# Patient Record
Sex: Male | Born: 1950 | Race: White | Hispanic: No | Marital: Married | Smoking: Former smoker
Health system: Southern US, Community
[De-identification: ages and names within clinical notes are randomized; demographics above are authoritative.]

## PROBLEM LIST (undated history)

## (undated) DIAGNOSIS — R0600 Dyspnea, unspecified: Secondary | ICD-10-CM

## (undated) DIAGNOSIS — T8859XA Other complications of anesthesia, initial encounter: Secondary | ICD-10-CM

## (undated) DIAGNOSIS — T4145XA Adverse effect of unspecified anesthetic, initial encounter: Secondary | ICD-10-CM

## (undated) DIAGNOSIS — R51 Headache: Secondary | ICD-10-CM

## (undated) DIAGNOSIS — C801 Malignant (primary) neoplasm, unspecified: Secondary | ICD-10-CM

## (undated) DIAGNOSIS — F419 Anxiety disorder, unspecified: Secondary | ICD-10-CM

## (undated) DIAGNOSIS — I5189 Other ill-defined heart diseases: Secondary | ICD-10-CM

## (undated) DIAGNOSIS — I2699 Other pulmonary embolism without acute cor pulmonale: Secondary | ICD-10-CM

## (undated) DIAGNOSIS — C61 Malignant neoplasm of prostate: Secondary | ICD-10-CM

## (undated) DIAGNOSIS — K219 Gastro-esophageal reflux disease without esophagitis: Secondary | ICD-10-CM

## (undated) DIAGNOSIS — I10 Essential (primary) hypertension: Secondary | ICD-10-CM

## (undated) DIAGNOSIS — Z9889 Other specified postprocedural states: Secondary | ICD-10-CM

## (undated) DIAGNOSIS — M1990 Unspecified osteoarthritis, unspecified site: Secondary | ICD-10-CM

## (undated) DIAGNOSIS — R112 Nausea with vomiting, unspecified: Secondary | ICD-10-CM

## (undated) DIAGNOSIS — R519 Headache, unspecified: Secondary | ICD-10-CM

## (undated) HISTORY — PX: PROSTATE SURGERY: SHX751

---
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
## 2001-09-30 HISTORY — PX: ROTATOR CUFF REPAIR: SHX139

## 2003-08-22 ENCOUNTER — Other Ambulatory Visit: Payer: Self-pay

## 2012-10-09 ENCOUNTER — Ambulatory Visit: Payer: Self-pay | Admitting: Unknown Physician Specialty

## 2012-10-12 LAB — PATHOLOGY REPORT

## 2013-08-31 ENCOUNTER — Ambulatory Visit: Payer: Self-pay | Admitting: Physician Assistant

## 2013-10-05 ENCOUNTER — Ambulatory Visit: Payer: Self-pay | Admitting: General Practice

## 2014-12-26 DIAGNOSIS — I2510 Atherosclerotic heart disease of native coronary artery without angina pectoris: Secondary | ICD-10-CM | POA: Insufficient documentation

## 2014-12-26 DIAGNOSIS — I10 Essential (primary) hypertension: Secondary | ICD-10-CM | POA: Insufficient documentation

## 2014-12-26 DIAGNOSIS — E785 Hyperlipidemia, unspecified: Secondary | ICD-10-CM | POA: Insufficient documentation

## 2015-10-04 ENCOUNTER — Ambulatory Visit: Payer: Self-pay | Admitting: Physician Assistant

## 2015-10-04 ENCOUNTER — Encounter: Payer: Self-pay | Admitting: Physician Assistant

## 2015-10-04 VITALS — BP 110/60 | HR 93 | Temp 98.4°F

## 2015-10-04 DIAGNOSIS — J00 Acute nasopharyngitis [common cold]: Secondary | ICD-10-CM

## 2015-10-04 NOTE — Progress Notes (Signed)
S: C/o runny nose and congestion for 3 days, no fever, chills, cp/sob, v/d; mucus was  clear throughout the day, cough is sporadic,   Using otc meds: tussin dm  O: PE: vitals wnl nad,  perrl eomi, normocephalic, tms dull, nasal mucosa red and swollen, throat injected, neck supple no lymph, lungs c t a, cv rrr, neuro intact  A:  Acute viral uri   P: reassurance, drink fluids, continue regular meds , use otc meds of choice, return if not improving in 5 days, return earlier if worsening

## 2015-10-19 ENCOUNTER — Telehealth: Payer: Self-pay | Admitting: Emergency Medicine

## 2015-10-19 NOTE — Telephone Encounter (Signed)
Patient called in and said that he is experiencing a lot of cough and congestion and wanted to know if you can call something in to [facility] on [address].

## 2015-12-22 ENCOUNTER — Other Ambulatory Visit: Payer: Self-pay

## 2015-12-22 DIAGNOSIS — Z299 Encounter for prophylactic measures, unspecified: Secondary | ICD-10-CM

## 2015-12-22 NOTE — Progress Notes (Signed)
Patient came in to have blood drawn per his physician, Dr. Vonzella Nipple from [facility].  Blood was drawn from the right arm without any incident. Patient wants results faxed to Dr. Gilford Rile when they are finalized.

## 2015-12-23 LAB — URINALYSIS, COMPLETE
Bilirubin, UA: NEGATIVE
Glucose, UA: NEGATIVE
Ketones, UA: NEGATIVE
Leukocytes, UA: NEGATIVE
Nitrite, UA: NEGATIVE
PH UA: 7 (ref 5.0–7.5)
PROTEIN UA: NEGATIVE
RBC UA: NEGATIVE
Specific Gravity, UA: 1.017 (ref 1.005–1.030)
Urobilinogen, Ur: 1 mg/dL (ref 0.2–1.0)

## 2015-12-23 LAB — CMP12+LP+TP+TSH+6AC+CBC/D/PLT
ALK PHOS: 59 IU/L (ref 39–117)
ALT: 19 IU/L (ref 0–44)
AST: 22 IU/L (ref 0–40)
Albumin/Globulin Ratio: 2 (ref 1.2–2.2)
Albumin: 4.5 g/dL (ref 3.6–4.8)
BASOS: 1 %
BILIRUBIN TOTAL: 0.5 mg/dL (ref 0.0–1.2)
BUN / CREAT RATIO: 9 — AB (ref 10–22)
BUN: 9 mg/dL (ref 8–27)
Basophils Absolute: 0.1 10*3/uL (ref 0.0–0.2)
CHOL/HDL RATIO: 4.7 ratio (ref 0.0–5.0)
Calcium: 9.2 mg/dL (ref 8.6–10.2)
Chloride: 99 mmol/L (ref 96–106)
Cholesterol, Total: 173 mg/dL (ref 100–199)
Creatinine, Ser: 0.98 mg/dL (ref 0.76–1.27)
EOS (ABSOLUTE): 0.1 10*3/uL (ref 0.0–0.4)
EOS: 2 %
ESTIMATED CHD RISK: 0.9 times avg. (ref 0.0–1.0)
Free Thyroxine Index: 1.9 (ref 1.2–4.9)
GFR calc non Af Amer: 80 mL/min/{1.73_m2} (ref >59)
GFR, EST AFRICAN AMERICAN: 92 mL/min/{1.73_m2} (ref >59)
GGT: 19 IU/L (ref 0–65)
GLUCOSE: 109 mg/dL — AB (ref 65–99)
Globulin, Total: 2.2 g/dL (ref 1.5–4.5)
HDL: 37 mg/dL — ABNORMAL LOW (ref >39)
HEMOGLOBIN: 14.9 g/dL (ref 12.6–17.7)
Hematocrit: 44.8 % (ref 37.5–51.0)
IMMATURE GRANULOCYTES: 0 %
Immature Grans (Abs): 0 10*3/uL (ref 0.0–0.1)
Iron: 76 ug/dL (ref 38–169)
LDH: 166 IU/L (ref 121–224)
LDL Calculated: 111 mg/dL — ABNORMAL HIGH (ref 0–99)
Lymphocytes Absolute: 1.9 10*3/uL (ref 0.7–3.1)
Lymphs: 33 %
MCH: 28.4 pg (ref 26.6–33.0)
MCHC: 33.3 g/dL (ref 31.5–35.7)
MCV: 86 fL (ref 79–97)
MONOS ABS: 0.9 10*3/uL (ref 0.1–0.9)
Monocytes: 15 %
NEUTROS ABS: 2.7 10*3/uL (ref 1.4–7.0)
Neutrophils: 49 %
POTASSIUM: 5.2 mmol/L (ref 3.5–5.2)
Phosphorus: 3.3 mg/dL (ref 2.5–4.5)
Platelets: 256 10*3/uL (ref 150–379)
RBC: 5.24 x10E6/uL (ref 4.14–5.80)
RDW: 13.8 % (ref 12.3–15.4)
SODIUM: 139 mmol/L (ref 134–144)
T3 Uptake Ratio: 26 % (ref 24–39)
T4, Total: 7.3 ug/dL (ref 4.5–12.0)
TSH: 3.73 u[IU]/mL (ref 0.450–4.500)
Total Protein: 6.7 g/dL (ref 6.0–8.5)
Triglycerides: 125 mg/dL (ref 0–149)
Uric Acid: 5.6 mg/dL (ref 3.7–8.6)
VLDL CHOLESTEROL CAL: 25 mg/dL (ref 5–40)
WBC: 5.7 10*3/uL (ref 3.4–10.8)

## 2015-12-23 LAB — MICROSCOPIC EXAMINATION
Bacteria, UA: NONE SEEN
CASTS: NONE SEEN /LPF
EPITHELIAL CELLS (NON RENAL): NONE SEEN /HPF (ref 0–10)
WBC UA: NONE SEEN /HPF (ref 0–?)

## 2015-12-25 NOTE — Progress Notes (Signed)
Lab results were faxed to Dr. Vonzella Nipple at [facility] and a copy was mailed to the patient.

## 2016-07-08 ENCOUNTER — Other Ambulatory Visit: Payer: Self-pay

## 2016-07-08 DIAGNOSIS — Z299 Encounter for prophylactic measures, unspecified: Secondary | ICD-10-CM

## 2016-07-08 NOTE — Progress Notes (Signed)
Patient came in to have blood drawn for testing per Dr. Dreama Saa orders.

## 2016-07-09 LAB — CMP12+LP+TP+TSH+6AC+CBC/D/PLT
ALK PHOS: 49 IU/L (ref 39–117)
ALT: 22 IU/L (ref 0–44)
AST: 25 IU/L (ref 0–40)
Albumin/Globulin Ratio: 1.7 (ref 1.2–2.2)
Albumin: 4.3 g/dL (ref 3.6–4.8)
BASOS: 1 %
BUN/Creatinine Ratio: 11 (ref 10–24)
BUN: 12 mg/dL (ref 8–27)
Basophils Absolute: 0 10*3/uL (ref 0.0–0.2)
Bilirubin Total: 0.8 mg/dL (ref 0.0–1.2)
CALCIUM: 9.4 mg/dL (ref 8.6–10.2)
CHOL/HDL RATIO: 5.1 ratio — AB (ref 0.0–5.0)
Chloride: 98 mmol/L (ref 96–106)
Cholesterol, Total: 178 mg/dL (ref 100–199)
Creatinine, Ser: 1.09 mg/dL (ref 0.76–1.27)
EOS (ABSOLUTE): 0.1 10*3/uL (ref 0.0–0.4)
EOS: 1 %
Estimated CHD Risk: 1.1 times avg. — ABNORMAL HIGH (ref 0.0–1.0)
Free Thyroxine Index: 2 (ref 1.2–4.9)
GFR calc non Af Amer: 70 mL/min/{1.73_m2} (ref >59)
GFR, EST AFRICAN AMERICAN: 81 mL/min/{1.73_m2} (ref >59)
GGT: 16 IU/L (ref 0–65)
GLOBULIN, TOTAL: 2.5 g/dL (ref 1.5–4.5)
GLUCOSE: 98 mg/dL (ref 65–99)
HDL: 35 mg/dL — AB (ref >39)
HEMATOCRIT: 43.4 % (ref 37.5–51.0)
Hemoglobin: 15.1 g/dL (ref 12.6–17.7)
IMMATURE GRANS (ABS): 0 10*3/uL (ref 0.0–0.1)
Immature Granulocytes: 0 %
Iron: 147 ug/dL (ref 38–169)
LDH: 177 IU/L (ref 121–224)
LDL CALC: 112 mg/dL — AB (ref 0–99)
LYMPHS: 34 %
Lymphocytes Absolute: 2.3 10*3/uL (ref 0.7–3.1)
MCH: 29.7 pg (ref 26.6–33.0)
MCHC: 34.8 g/dL (ref 31.5–35.7)
MCV: 85 fL (ref 79–97)
MONOCYTES: 11 %
Monocytes Absolute: 0.7 10*3/uL (ref 0.1–0.9)
NEUTROS ABS: 3.5 10*3/uL (ref 1.4–7.0)
Neutrophils: 53 %
POTASSIUM: 4.7 mmol/L (ref 3.5–5.2)
Phosphorus: 3.2 mg/dL (ref 2.5–4.5)
Platelets: 261 10*3/uL (ref 150–379)
RBC: 5.09 x10E6/uL (ref 4.14–5.80)
RDW: 13.2 % (ref 12.3–15.4)
SODIUM: 135 mmol/L (ref 134–144)
T3 Uptake Ratio: 26 % (ref 24–39)
T4, Total: 7.7 ug/dL (ref 4.5–12.0)
TRIGLYCERIDES: 157 mg/dL — AB (ref 0–149)
TSH: 3.97 u[IU]/mL (ref 0.450–4.500)
Total Protein: 6.8 g/dL (ref 6.0–8.5)
URIC ACID: 5.1 mg/dL (ref 3.7–8.6)
VLDL Cholesterol Cal: 31 mg/dL (ref 5–40)
WBC: 6.6 10*3/uL (ref 3.4–10.8)

## 2016-07-09 LAB — MICROSCOPIC EXAMINATION
Bacteria, UA: NONE SEEN
CASTS: NONE SEEN /LPF
Epithelial Cells (non renal): NONE SEEN /hpf (ref 0–10)

## 2016-07-09 LAB — URINALYSIS, COMPLETE
Bilirubin, UA: NEGATIVE
Glucose, UA: NEGATIVE
Ketones, UA: NEGATIVE
LEUKOCYTES UA: NEGATIVE
NITRITE UA: NEGATIVE
PH UA: 6 (ref 5.0–7.5)
PROTEIN UA: NEGATIVE
RBC, UA: NEGATIVE
Specific Gravity, UA: 1.017 (ref 1.005–1.030)
Urobilinogen, Ur: 1 mg/dL (ref 0.2–1.0)

## 2017-04-17 DIAGNOSIS — E669 Obesity, unspecified: Secondary | ICD-10-CM | POA: Insufficient documentation

## 2017-04-22 ENCOUNTER — Other Ambulatory Visit: Payer: Self-pay

## 2017-04-22 DIAGNOSIS — Z299 Encounter for prophylactic measures, unspecified: Secondary | ICD-10-CM

## 2017-04-22 NOTE — Progress Notes (Signed)
Patient came in to have blood drawn for testing per Dr. Wolff's orders. 

## 2017-04-23 LAB — PSA: Prostate Specific Ag, Serum: 0.1 ng/mL (ref 0.0–4.0)

## 2017-06-25 ENCOUNTER — Ambulatory Visit: Payer: Self-pay | Admitting: Physician Assistant

## 2017-06-25 VITALS — BP 159/79 | HR 106 | Temp 99.0°F | Resp 16

## 2017-06-25 DIAGNOSIS — R509 Fever, unspecified: Secondary | ICD-10-CM

## 2017-06-25 DIAGNOSIS — R000 Tachycardia, unspecified: Secondary | ICD-10-CM

## 2017-06-25 LAB — POCT INFLUENZA A/B
INFLUENZA B, POC: NEGATIVE
Influenza A, POC: NEGATIVE

## 2017-06-25 MED ORDER — AZITHROMYCIN 250 MG PO TABS: ORAL_TABLET | ORAL | 0 refills | Status: DC

## 2017-06-25 MED ORDER — BENZONATATE 200 MG PO CAPS: 200.0000 mg | ORAL_CAPSULE | Freq: Two times a day (BID) | ORAL | 0 refills | Status: DC | PRN

## 2017-06-25 NOTE — Progress Notes (Signed)
S: C/o runny nose and congestion with dry cough for 3 days, + fever, chills, body aches, denies cp/sob, v/d; cough is sporadic, feels weak and tired  Using otc meds:   O: PE: vitals wnl, nad,  perrl eomi, normocephalic, tms dull, nasal mucosa red and swollen, throat injected, neck supple no lymph, lungs c t a, cv tachycardic, neuro intact, flu swab neg, ekg nsr  A:  Acute uri   P: drink fluids, continue regular meds , use otc meds of choice, return if not improving in 5 days, return earlier if worsening, zpack, tessalon perls

## 2017-07-21 ENCOUNTER — Encounter: Payer: Self-pay | Admitting: Physician Assistant

## 2017-07-21 ENCOUNTER — Ambulatory Visit: Payer: Self-pay | Admitting: Physician Assistant

## 2017-07-21 VITALS — BP 140/80 | HR 107 | Temp 97.8°F | Resp 16

## 2017-07-21 DIAGNOSIS — J0110 Acute frontal sinusitis, unspecified: Secondary | ICD-10-CM

## 2017-07-21 MED ORDER — AMOXICILLIN 875 MG PO TABS: 875.0000 mg | ORAL_TABLET | Freq: Two times a day (BID) | ORAL | 0 refills | Status: DC

## 2017-07-21 MED ORDER — PROMETHAZINE-DM 6.25-15 MG/5ML PO SYRP: 5.0000 mL | ORAL_SOLUTION | Freq: Four times a day (QID) | ORAL | 0 refills | Status: DC | PRN

## 2017-07-21 NOTE — Progress Notes (Signed)
   Subjective:sinus isssues    Patient ID: Johnny Freeman, male    DOB: [birth year], 66 y.o.   MRN: [id number]  HPI Patient c/o 2 weeks of sinus congestion, frontal headache, and nausea 2nd to post nasal drainage. Seen last week for same and states mild release with Medrol dosepack and Tessalon.   Review of Systems   Hypertension Objective:   Physical Exam HEENT remarkable for edematous nasal turbinates, bilateral maxillary guarding, and post nasal drainage. Neck supple w/o adenopathy. Lungs CTA and Heart RRR.       Assessment & Plan:Sinusitis  Amoxil and Phenergan DM.  Follow up one week if no improvement.

## 2017-07-31 DIAGNOSIS — K219 Gastro-esophageal reflux disease without esophagitis: Secondary | ICD-10-CM | POA: Insufficient documentation

## 2017-09-30 DIAGNOSIS — I2699 Other pulmonary embolism without acute cor pulmonale: Secondary | ICD-10-CM

## 2017-09-30 HISTORY — DX: Other pulmonary embolism without acute cor pulmonale: I26.99

## 2017-10-06 ENCOUNTER — Other Ambulatory Visit: Payer: Self-pay | Admitting: Orthopedic Surgery

## 2017-10-06 DIAGNOSIS — M1712 Unilateral primary osteoarthritis, left knee: Secondary | ICD-10-CM

## 2017-10-17 ENCOUNTER — Ambulatory Visit
Admission: RE | Admit: 2017-10-17 | Discharge: 2017-10-17 | Disposition: A | Discharge status: Home / Self Care | Payer: Managed Care, Other (non HMO) | Source: Ambulatory Visit | Attending: Orthopedic Surgery | Admitting: Orthopedic Surgery

## 2017-10-17 DIAGNOSIS — M1712 Unilateral primary osteoarthritis, left knee: Secondary | ICD-10-CM | POA: Insufficient documentation

## 2017-10-17 DIAGNOSIS — M21162 Varus deformity, not elsewhere classified, left knee: Secondary | ICD-10-CM | POA: Diagnosis not present

## 2017-10-22 ENCOUNTER — Other Ambulatory Visit: Payer: Self-pay

## 2017-10-22 ENCOUNTER — Encounter
Admission: RE | Admit: 2017-10-22 | Discharge: 2017-10-22 | Disposition: A | Discharge status: Home / Self Care | Payer: Managed Care, Other (non HMO) | Source: Ambulatory Visit | Attending: Orthopedic Surgery | Admitting: Orthopedic Surgery

## 2017-10-22 DIAGNOSIS — Z01812 Encounter for preprocedural laboratory examination: Secondary | ICD-10-CM | POA: Insufficient documentation

## 2017-10-22 HISTORY — DX: Gastro-esophageal reflux disease without esophagitis: K21.9

## 2017-10-22 HISTORY — DX: Malignant (primary) neoplasm, unspecified: C80.1

## 2017-10-22 HISTORY — DX: Headache, unspecified: R51.9

## 2017-10-22 HISTORY — DX: Headache: R51

## 2017-10-22 HISTORY — DX: Anxiety disorder, unspecified: F41.9

## 2017-10-22 LAB — URINALYSIS, COMPLETE (UACMP) WITH MICROSCOPIC
Bacteria, UA: NONE SEEN
Bilirubin Urine: NEGATIVE
Glucose, UA: NEGATIVE mg/dL
Hgb urine dipstick: NEGATIVE
KETONES UR: NEGATIVE mg/dL
Leukocytes, UA: NEGATIVE
Nitrite: NEGATIVE
PROTEIN: NEGATIVE mg/dL
SQUAMOUS EPITHELIAL / LPF: NONE SEEN
Specific Gravity, Urine: 1.009 (ref 1.005–1.030)
WBC UA: NONE SEEN WBC/hpf (ref 0–5)
pH: 7 (ref 5.0–8.0)

## 2017-10-22 LAB — SURGICAL PCR SCREEN
MRSA, PCR: NEGATIVE
STAPHYLOCOCCUS AUREUS: NEGATIVE

## 2017-10-22 LAB — TYPE AND SCREEN
ABO/RH(D): O POS
ANTIBODY SCREEN: NEGATIVE

## 2017-10-22 LAB — PROTIME-INR
INR: 0.9
PROTHROMBIN TIME: 12.1 s (ref 11.4–15.2)

## 2017-10-22 LAB — APTT: APTT: 29 s (ref 24–36)

## 2017-10-22 LAB — CBC
HEMATOCRIT: 44.6 % (ref 40.0–52.0)
HEMOGLOBIN: 15 g/dL (ref 13.0–18.0)
MCH: 29.9 pg (ref 26.0–34.0)
MCHC: 33.7 g/dL (ref 32.0–36.0)
MCV: 88.8 fL (ref 80.0–100.0)
Platelets: 251 10*3/uL (ref 150–440)
RBC: 5.02 MIL/uL (ref 4.40–5.90)
RDW: 13.7 % (ref 11.5–14.5)
WBC: 5 10*3/uL (ref 3.8–10.6)

## 2017-10-22 LAB — BASIC METABOLIC PANEL
ANION GAP: 7 (ref 5–15)
BUN: 13 mg/dL (ref 6–20)
CHLORIDE: 96 mmol/L — AB (ref 101–111)
CO2: 29 mmol/L (ref 22–32)
Calcium: 9.7 mg/dL (ref 8.9–10.3)
Creatinine, Ser: 0.88 mg/dL (ref 0.61–1.24)
GFR calc Af Amer: >60 mL/min (ref >60)
GLUCOSE: 100 mg/dL — AB (ref 65–99)
POTASSIUM: 4.6 mmol/L (ref 3.5–5.1)
Sodium: 132 mmol/L — ABNORMAL LOW (ref 135–145)

## 2017-10-22 LAB — SEDIMENTATION RATE: Sed Rate: 2 mm/hr (ref 0–20)

## 2017-10-22 NOTE — Patient Instructions (Signed)
Your procedure is scheduled on: November 04, 2017 TUESDAY Report to Same Day Surgery on the 2nd floor in the [facility]. To find out your arrival time, please call [phone] between 1PM - 3PM on: Monday November 03, 2017  REMEMBER: Instructions that are not followed completely may result in serious medical risk, up to and including death; or upon the discretion of your surgeon and anesthesiologist your surgery may need to be rescheduled.  Do not eat food after midnight the night before your procedure.  No gum chewing or hard candies.  You may however, drink CLEAR liquids up to 2 hours before you are scheduled to arrive at the hospital for your procedure.  Do not drink clear liquids within 2 hours of the start of your surgery.  Clear liquids include: - water  - apple juice without pulp - clear gatorade - black coffee or tea (Do NOT add anything to the coffee or tea) Do NOT drink anything that is not on this list.  Notify your doctor if there is any change in your medical condition (cold, fever, infection).  Do not wear jewelry, make-up, hairpins, clips or nail polish.  Do not wear lotions, powders, or perfumes. You may NOTwear deodorant.  Do not shave 48 hours prior to surgery. Men may shave face and neck.  Contacts and dentures may not be worn into surgery.  Do not bring valuables to the hospital. [facility] is not responsible for any belongings or valuables.   TAKE THESE MEDICATIONS THE MORNING OF SURGERY WITH A SIP OF WATER: OMEPRAZOLE    Use CHG Soap or wipes as directed on instruction sheet.  Follow recommendations from Cardiologist, Pulmonologist or PCP regarding stopping Aspirin, Coumadin, Plavix, Eliquis, Pradaxa,  UNTIL AFTER SURGERY.  Stop Anti-inflammatories 7- 10 DAYS such as Advil, Aleve, Ibuprofen, Motrin, Naproxen, Naprosyn, Goodie powder, or aspirin products,  EXCEDRIN MIGRAINE. (May take Tylenol or Acetaminophen if needed.)  Stop ANY OVER THE COUNTER  supplements until after surgery. (May continue Vitamin D, Vitamin B, and multivitamin.)  If you are being admitted to the hospital overnight, leave your suitcase in the car. After surgery it may be brought to your room.  If you are being discharged the day of surgery, you will not be allowed to drive home. You will need someone to drive you home and stay with you that night.   If you are taking public transportation, you will need to have a responsible adult to with you.  Please call the number above if you have any questions about these instructions.

## 2017-10-23 LAB — URINE CULTURE

## 2017-11-03 MED ORDER — CEFAZOLIN SODIUM-DEXTROSE 2-4 GM/100ML-% IV SOLN
2.0000 g | Freq: Once | INTRAVENOUS | Status: AC
  Administered 2017-11-04: 2 g via INTRAVENOUS

## 2017-11-04 ENCOUNTER — Inpatient Hospital Stay: Payer: Managed Care, Other (non HMO) | Admitting: Anesthesiology

## 2017-11-04 ENCOUNTER — Inpatient Hospital Stay: Payer: Managed Care, Other (non HMO)

## 2017-11-04 ENCOUNTER — Other Ambulatory Visit: Payer: Self-pay

## 2017-11-04 ENCOUNTER — Encounter: Payer: Self-pay | Admitting: *Deleted

## 2017-11-04 ENCOUNTER — Encounter
Admission: RE | Disposition: A | Discharge status: Home Health | Payer: Self-pay | Source: Home / Self Care | Attending: Orthopedic Surgery

## 2017-11-04 ENCOUNTER — Inpatient Hospital Stay
Admission: RE | Admit: 2017-11-04 | Discharge: 2017-11-10 | DRG: 470 | Disposition: A | Discharge status: Home Health | Payer: Managed Care, Other (non HMO) | Attending: Orthopedic Surgery | Admitting: Orthopedic Surgery

## 2017-11-04 DIAGNOSIS — Z7982 Long term (current) use of aspirin: Secondary | ICD-10-CM | POA: Diagnosis not present

## 2017-11-04 DIAGNOSIS — Z87891 Personal history of nicotine dependence: Secondary | ICD-10-CM | POA: Diagnosis not present

## 2017-11-04 DIAGNOSIS — I5030 Unspecified diastolic (congestive) heart failure: Secondary | ICD-10-CM | POA: Diagnosis not present

## 2017-11-04 DIAGNOSIS — Z6833 Body mass index (BMI) 33.0-33.9, adult: Secondary | ICD-10-CM | POA: Diagnosis not present

## 2017-11-04 DIAGNOSIS — Z91013 Allergy to seafood: Secondary | ICD-10-CM

## 2017-11-04 DIAGNOSIS — Z8546 Personal history of malignant neoplasm of prostate: Secondary | ICD-10-CM

## 2017-11-04 DIAGNOSIS — E871 Hypo-osmolality and hyponatremia: Secondary | ICD-10-CM | POA: Diagnosis present

## 2017-11-04 DIAGNOSIS — E878 Other disorders of electrolyte and fluid balance, not elsewhere classified: Secondary | ICD-10-CM | POA: Diagnosis present

## 2017-11-04 DIAGNOSIS — M659 Synovitis and tenosynovitis, unspecified: Secondary | ICD-10-CM | POA: Diagnosis present

## 2017-11-04 DIAGNOSIS — K219 Gastro-esophageal reflux disease without esophagitis: Secondary | ICD-10-CM | POA: Diagnosis present

## 2017-11-04 DIAGNOSIS — M25562 Pain in left knee: Secondary | ICD-10-CM | POA: Diagnosis present

## 2017-11-04 DIAGNOSIS — R000 Tachycardia, unspecified: Secondary | ICD-10-CM | POA: Diagnosis present

## 2017-11-04 DIAGNOSIS — N320 Bladder-neck obstruction: Secondary | ICD-10-CM | POA: Diagnosis present

## 2017-11-04 DIAGNOSIS — I2699 Other pulmonary embolism without acute cor pulmonale: Secondary | ICD-10-CM | POA: Diagnosis not present

## 2017-11-04 DIAGNOSIS — K5900 Constipation, unspecified: Secondary | ICD-10-CM | POA: Diagnosis not present

## 2017-11-04 DIAGNOSIS — Z8249 Family history of ischemic heart disease and other diseases of the circulatory system: Secondary | ICD-10-CM | POA: Diagnosis not present

## 2017-11-04 DIAGNOSIS — Z23 Encounter for immunization: Secondary | ICD-10-CM

## 2017-11-04 DIAGNOSIS — I10 Essential (primary) hypertension: Secondary | ICD-10-CM | POA: Diagnosis present

## 2017-11-04 DIAGNOSIS — D62 Acute posthemorrhagic anemia: Secondary | ICD-10-CM | POA: Diagnosis not present

## 2017-11-04 DIAGNOSIS — M1712 Unilateral primary osteoarthritis, left knee: Principal | ICD-10-CM | POA: Diagnosis present

## 2017-11-04 DIAGNOSIS — F419 Anxiety disorder, unspecified: Secondary | ICD-10-CM | POA: Diagnosis present

## 2017-11-04 DIAGNOSIS — Z79899 Other long term (current) drug therapy: Secondary | ICD-10-CM

## 2017-11-04 DIAGNOSIS — G8918 Other acute postprocedural pain: Secondary | ICD-10-CM

## 2017-11-04 HISTORY — PX: CYSTOSCOPY: SHX5120

## 2017-11-04 HISTORY — PX: TOTAL KNEE ARTHROPLASTY: SHX125

## 2017-11-04 LAB — CREATININE, SERUM
Creatinine, Ser: 0.93 mg/dL (ref 0.61–1.24)
GFR calc non Af Amer: >60 mL/min (ref >60)

## 2017-11-04 LAB — CBC
HEMATOCRIT: 38.9 % — AB (ref 40.0–52.0)
HEMOGLOBIN: 13.1 g/dL (ref 13.0–18.0)
MCH: 29.6 pg (ref 26.0–34.0)
MCHC: 33.7 g/dL (ref 32.0–36.0)
MCV: 87.8 fL (ref 80.0–100.0)
Platelets: 244 10*3/uL (ref 150–440)
RBC: 4.43 MIL/uL (ref 4.40–5.90)
RDW: 13.4 % (ref 11.5–14.5)
WBC: 11.6 10*3/uL — AB (ref 3.8–10.6)

## 2017-11-04 LAB — ABO/RH: ABO/RH(D): O POS

## 2017-11-04 SURGERY — ARTHROPLASTY, KNEE, TOTAL
Anesthesia: Choice

## 2017-11-04 MED ORDER — VITAMIN C 500 MG PO TABS
500.0000 mg | ORAL_TABLET | Freq: Every day | ORAL | Status: DC
  Administered 2017-11-05 – 2017-11-09 (×5): 500 mg via ORAL
  Filled 2017-11-04 (×7): qty 1

## 2017-11-04 MED ORDER — VITAMIN D 1000 UNITS PO TABS
5000.0000 [IU] | ORAL_TABLET | Freq: Every day | ORAL | Status: DC
  Administered 2017-11-05 – 2017-11-09 (×5): 5000 [IU] via ORAL
  Filled 2017-11-04 (×5): qty 5

## 2017-11-04 MED ORDER — ENOXAPARIN SODIUM 30 MG/0.3ML ~~LOC~~ SOLN
30.0000 mg | Freq: Two times a day (BID) | SUBCUTANEOUS | Status: DC
  Administered 2017-11-05 – 2017-11-06 (×4): 30 mg via SUBCUTANEOUS
  Filled 2017-11-04 (×4): qty 0.3

## 2017-11-04 MED ORDER — METHOCARBAMOL 1000 MG/10ML IJ SOLN
500.0000 mg | Freq: Four times a day (QID) | INTRAVENOUS | Status: DC | PRN
  Administered 2017-11-09: 500 mg via INTRAVENOUS
  Filled 2017-11-04: qty 5

## 2017-11-04 MED ORDER — ONDANSETRON HCL 4 MG PO TABS
4.0000 mg | ORAL_TABLET | Freq: Four times a day (QID) | ORAL | Status: DC | PRN
  Administered 2017-11-09: 4 mg via ORAL
  Filled 2017-11-04: qty 1

## 2017-11-04 MED ORDER — BUPIVACAINE IN DEXTROSE 0.75-8.25 % IT SOLN
INTRATHECAL | Status: DC | PRN
  Administered 2017-11-04: 2 mL via INTRATHECAL

## 2017-11-04 MED ORDER — FENTANYL CITRATE (PF) 100 MCG/2ML IJ SOLN: 25.0000 ug | INTRAMUSCULAR | Status: DC | PRN

## 2017-11-04 MED ORDER — ONDANSETRON HCL 4 MG/2ML IJ SOLN
4.0000 mg | Freq: Once | INTRAMUSCULAR | Status: AC | PRN
  Administered 2017-11-04: 4 mg via INTRAVENOUS

## 2017-11-04 MED ORDER — ONDANSETRON HCL 4 MG/2ML IJ SOLN
4.0000 mg | Freq: Four times a day (QID) | INTRAMUSCULAR | Status: DC | PRN
  Administered 2017-11-04 – 2017-11-08 (×3): 4 mg via INTRAVENOUS
  Filled 2017-11-04 (×3): qty 2

## 2017-11-04 MED ORDER — OMEGA-3-ACID ETHYL ESTERS 1 G PO CAPS
1.0000 g | ORAL_CAPSULE | Freq: Two times a day (BID) | ORAL | Status: DC
  Administered 2017-11-04 – 2017-11-09 (×11): 1 g via ORAL
  Filled 2017-11-04 (×12): qty 1

## 2017-11-04 MED ORDER — BENAZEPRIL HCL 20 MG PO TABS
20.0000 mg | ORAL_TABLET | Freq: Every day | ORAL | Status: DC
  Administered 2017-11-05 – 2017-11-06 (×2): 20 mg via ORAL
  Filled 2017-11-04 (×2): qty 1

## 2017-11-04 MED ORDER — PNEUMOCOCCAL VAC POLYVALENT 25 MCG/0.5ML IJ INJ
0.5000 mL | INJECTION | INTRAMUSCULAR | Status: AC
  Administered 2017-11-05: 0.5 mL via INTRAMUSCULAR
  Filled 2017-11-04: qty 0.5

## 2017-11-04 MED ORDER — CEFAZOLIN SODIUM-DEXTROSE 2-4 GM/100ML-% IV SOLN
2.0000 g | Freq: Four times a day (QID) | INTRAVENOUS | Status: AC
  Administered 2017-11-04 – 2017-11-05 (×3): 2 g via INTRAVENOUS
  Filled 2017-11-04 (×3): qty 100

## 2017-11-04 MED ORDER — MIDAZOLAM HCL 2 MG/2ML IJ SOLN
INTRAMUSCULAR | Status: AC
  Filled 2017-11-04: qty 2

## 2017-11-04 MED ORDER — ACETAMINOPHEN 650 MG RE SUPP: 650.0000 mg | RECTAL | Status: DC | PRN

## 2017-11-04 MED ORDER — PROPOFOL 10 MG/ML IV BOLUS
INTRAVENOUS | Status: DC | PRN
  Administered 2017-11-04: 50 mg via INTRAVENOUS

## 2017-11-04 MED ORDER — MORPHINE SULFATE (PF) 2 MG/ML IV SOLN
2.0000 mg | INTRAVENOUS | Status: DC | PRN
  Administered 2017-11-04 – 2017-11-05 (×4): 2 mg via INTRAVENOUS
  Filled 2017-11-04 (×4): qty 1

## 2017-11-04 MED ORDER — OXYCODONE HCL 5 MG PO TABS
5.0000 mg | ORAL_TABLET | ORAL | Status: DC | PRN
  Administered 2017-11-04 – 2017-11-10 (×13): 5 mg via ORAL
  Filled 2017-11-04 (×15): qty 1

## 2017-11-04 MED ORDER — PROPOFOL 500 MG/50ML IV EMUL
INTRAVENOUS | Status: DC | PRN
  Administered 2017-11-04: 50 ug/kg/min via INTRAVENOUS

## 2017-11-04 MED ORDER — ADULT MULTIVITAMIN W/MINERALS CH
1.0000 | ORAL_TABLET | Freq: Every day | ORAL | Status: DC
  Administered 2017-11-05 – 2017-11-09 (×5): 1 via ORAL
  Filled 2017-11-04 (×6): qty 1

## 2017-11-04 MED ORDER — NEOMYCIN-POLYMYXIN B GU 40-200000 IR SOLN
Status: AC
  Filled 2017-11-04: qty 20

## 2017-11-04 MED ORDER — PHENYLEPHRINE HCL 10 MG/ML IJ SOLN
INTRAMUSCULAR | Status: DC | PRN
  Administered 2017-11-04 (×2): 100 ug via INTRAVENOUS

## 2017-11-04 MED ORDER — MAGNESIUM CITRATE PO SOLN
1.0000 | Freq: Once | ORAL | Status: AC | PRN
  Administered 2017-11-07: 1 via ORAL
  Filled 2017-11-04 (×2): qty 296

## 2017-11-04 MED ORDER — INFLUENZA VAC SPLIT HIGH-DOSE 0.5 ML IM SUSY
0.5000 mL | PREFILLED_SYRINGE | INTRAMUSCULAR | Status: AC
  Administered 2017-11-05: 0.5 mL via INTRAMUSCULAR
  Filled 2017-11-04: qty 0.5

## 2017-11-04 MED ORDER — ONDANSETRON HCL 4 MG/2ML IJ SOLN
INTRAMUSCULAR | Status: AC
  Filled 2017-11-04: qty 2

## 2017-11-04 MED ORDER — SODIUM CHLORIDE 0.9 % IV SOLN
INTRAVENOUS | Status: DC | PRN
  Administered 2017-11-04: 60 mL

## 2017-11-04 MED ORDER — MAGNESIUM HYDROXIDE 400 MG/5ML PO SUSP
30.0000 mL | Freq: Every day | ORAL | Status: DC | PRN
  Administered 2017-11-05 – 2017-11-06 (×2): 30 mL via ORAL
  Filled 2017-11-04 (×2): qty 30

## 2017-11-04 MED ORDER — ACETAMINOPHEN 325 MG PO TABS
650.0000 mg | ORAL_TABLET | ORAL | Status: DC | PRN
  Administered 2017-11-06: 650 mg via ORAL
  Filled 2017-11-04: qty 2

## 2017-11-04 MED ORDER — MENTHOL 3 MG MT LOZG
1.0000 | LOZENGE | OROMUCOSAL | Status: DC | PRN
  Filled 2017-11-04: qty 9

## 2017-11-04 MED ORDER — ATROPINE SULFATE 1 MG/10ML IJ SOSY
PREFILLED_SYRINGE | INTRAMUSCULAR | Status: AC
  Filled 2017-11-04: qty 10

## 2017-11-04 MED ORDER — BISACODYL 10 MG RE SUPP: 10.0000 mg | Freq: Every day | RECTAL | Status: DC | PRN

## 2017-11-04 MED ORDER — AMLODIPINE BESYLATE 5 MG PO TABS
5.0000 mg | ORAL_TABLET | Freq: Every day | ORAL | Status: DC
  Administered 2017-11-05 – 2017-11-06 (×2): 5 mg via ORAL
  Filled 2017-11-04 (×3): qty 1

## 2017-11-04 MED ORDER — TRANEXAMIC ACID 1000 MG/10ML IV SOLN
1000.0000 mg | INTRAVENOUS | Status: AC
  Administered 2017-11-04: 1000 mg via INTRAVENOUS
  Filled 2017-11-04: qty 10

## 2017-11-04 MED ORDER — BUPIVACAINE LIPOSOME 1.3 % IJ SUSP
INTRAMUSCULAR | Status: AC
  Filled 2017-11-04: qty 20

## 2017-11-04 MED ORDER — BUPIVACAINE-EPINEPHRINE (PF) 0.25% -1:200000 IJ SOLN
INTRAMUSCULAR | Status: DC | PRN
  Administered 2017-11-04: 30 mL

## 2017-11-04 MED ORDER — LACTATED RINGERS IV SOLN
INTRAVENOUS | Status: DC
  Administered 2017-11-04: 09:00:00 via INTRAVENOUS

## 2017-11-04 MED ORDER — DIPHENHYDRAMINE HCL 12.5 MG/5ML PO ELIX: 12.5000 mg | ORAL_SOLUTION | ORAL | Status: DC | PRN

## 2017-11-04 MED ORDER — ACETAMINOPHEN 10 MG/ML IV SOLN
INTRAVENOUS | Status: AC
  Filled 2017-11-04: qty 100

## 2017-11-04 MED ORDER — SODIUM CHLORIDE 0.9 % IV SOLN
INTRAVENOUS | Status: DC
  Administered 2017-11-04: 18:00:00 via INTRAVENOUS

## 2017-11-04 MED ORDER — ZOLPIDEM TARTRATE 5 MG PO TABS: 5.0000 mg | ORAL_TABLET | Freq: Every evening | ORAL | Status: DC | PRN

## 2017-11-04 MED ORDER — BUPIVACAINE-EPINEPHRINE (PF) 0.25% -1:200000 IJ SOLN
INTRAMUSCULAR | Status: AC
  Filled 2017-11-04: qty 30

## 2017-11-04 MED ORDER — METHOCARBAMOL 500 MG PO TABS
500.0000 mg | ORAL_TABLET | Freq: Four times a day (QID) | ORAL | Status: DC | PRN
  Administered 2017-11-04 – 2017-11-10 (×18): 500 mg via ORAL
  Filled 2017-11-04 (×21): qty 1

## 2017-11-04 MED ORDER — NEOMYCIN-POLYMYXIN B GU 40-200000 IR SOLN
Status: DC | PRN
  Administered 2017-11-04: 16 mL

## 2017-11-04 MED ORDER — METOCLOPRAMIDE HCL 5 MG/ML IJ SOLN: 5.0000 mg | Freq: Three times a day (TID) | INTRAMUSCULAR | Status: DC | PRN

## 2017-11-04 MED ORDER — MORPHINE SULFATE (PF) 10 MG/ML IV SOLN
INTRAVENOUS | Status: AC
  Filled 2017-11-04: qty 1

## 2017-11-04 MED ORDER — CEFAZOLIN SODIUM-DEXTROSE 2-4 GM/100ML-% IV SOLN
INTRAVENOUS | Status: AC
  Filled 2017-11-04: qty 100

## 2017-11-04 MED ORDER — GLYCOPYRROLATE 0.2 MG/ML IJ SOLN: 0.1000 mg | Freq: Once | INTRAMUSCULAR | Status: DC

## 2017-11-04 MED ORDER — ACETAMINOPHEN 10 MG/ML IV SOLN
INTRAVENOUS | Status: DC | PRN
  Administered 2017-11-04: 1000 mg via INTRAVENOUS

## 2017-11-04 MED ORDER — PANTOPRAZOLE SODIUM 40 MG PO TBEC
80.0000 mg | DELAYED_RELEASE_TABLET | Freq: Every day | ORAL | Status: DC
  Administered 2017-11-05 – 2017-11-10 (×6): 80 mg via ORAL
  Filled 2017-11-04 (×6): qty 2

## 2017-11-04 MED ORDER — SODIUM CHLORIDE 0.9 % IJ SOLN
INTRAMUSCULAR | Status: AC
Start: 2017-11-04 — End: ?
  Filled 2017-11-04: qty 100

## 2017-11-04 MED ORDER — PROPOFOL 500 MG/50ML IV EMUL
INTRAVENOUS | Status: AC
  Filled 2017-11-04: qty 50

## 2017-11-04 MED ORDER — ASPIRIN EC 81 MG PO TBEC
162.0000 mg | DELAYED_RELEASE_TABLET | Freq: Every day | ORAL | Status: DC
  Administered 2017-11-05 – 2017-11-10 (×6): 162 mg via ORAL
  Filled 2017-11-04 (×6): qty 2

## 2017-11-04 MED ORDER — MIDAZOLAM HCL 5 MG/5ML IJ SOLN
INTRAMUSCULAR | Status: DC | PRN
  Administered 2017-11-04: 2 mg via INTRAVENOUS

## 2017-11-04 MED ORDER — METOCLOPRAMIDE HCL 10 MG PO TABS
5.0000 mg | ORAL_TABLET | Freq: Three times a day (TID) | ORAL | Status: DC | PRN
  Administered 2017-11-07: 10 mg via ORAL
  Filled 2017-11-04: qty 1

## 2017-11-04 MED ORDER — OXYCODONE HCL 5 MG PO TABS
10.0000 mg | ORAL_TABLET | ORAL | Status: DC | PRN
  Administered 2017-11-04 – 2017-11-10 (×17): 10 mg via ORAL
  Filled 2017-11-04 (×18): qty 2

## 2017-11-04 MED ORDER — AMLODIPINE BESY-BENAZEPRIL HCL 5-20 MG PO CAPS: 1.0000 | ORAL_CAPSULE | Freq: Every day | ORAL | Status: DC

## 2017-11-04 MED ORDER — DOCUSATE SODIUM 100 MG PO CAPS
100.0000 mg | ORAL_CAPSULE | Freq: Every day | ORAL | Status: DC
  Administered 2017-11-05 – 2017-11-06 (×2): 100 mg via ORAL
  Filled 2017-11-04 (×2): qty 1

## 2017-11-04 MED ORDER — PHENOL 1.4 % MT LIQD
1.0000 | OROMUCOSAL | Status: DC | PRN
  Filled 2017-11-04: qty 177

## 2017-11-04 MED ORDER — GLYCOPYRROLATE 0.2 MG/ML IJ SOLN
INTRAMUSCULAR | Status: AC
  Administered 2017-11-04: 0.2 mg via INTRAVENOUS
  Filled 2017-11-04: qty 1

## 2017-11-04 MED ORDER — GLYCOPYRROLATE 0.2 MG/ML IJ SOLN
0.2000 mg | Freq: Once | INTRAMUSCULAR | Status: AC
  Administered 2017-11-04: 0.2 mg via INTRAVENOUS

## 2017-11-04 MED ORDER — SODIUM CHLORIDE 0.9 % IV SOLN
INTRAVENOUS | Status: DC | PRN
  Administered 2017-11-04: 40 ug/min via INTRAVENOUS

## 2017-11-04 MED ORDER — DOCUSATE SODIUM 100 MG PO CAPS
100.0000 mg | ORAL_CAPSULE | Freq: Two times a day (BID) | ORAL | Status: DC
  Administered 2017-11-04 – 2017-11-10 (×10): 100 mg via ORAL
  Filled 2017-11-04 (×10): qty 1

## 2017-11-04 MED ORDER — MORPHINE SULFATE 10 MG/ML IJ SOLN
INTRAMUSCULAR | Status: DC | PRN
  Administered 2017-11-04: 10 mg via INTRAVENOUS

## 2017-11-04 SURGICAL SUPPLY — 70 items
BAG URINE DRAINAGE (UROLOGICAL SUPPLIES) ×4 IMPLANT
BANDAGE ACE 6X5 VEL STRL LF (GAUZE/BANDAGES/DRESSINGS) ×4 IMPLANT
BLADE SAW 1 (BLADE) ×4 IMPLANT
BLOCK CUTTING FEMUR 5+ LT MED (MISCELLANEOUS) IMPLANT
BLOCK CUTTING TIBIAL 5 LT (MISCELLANEOUS) IMPLANT
CANISTER SUCT 1200ML W/VALVE (MISCELLANEOUS) ×4 IMPLANT
CANISTER SUCT 3000ML PPV (MISCELLANEOUS) ×8 IMPLANT
CAPT KNEE TOTAL 3 ×4 IMPLANT
CATH COUDE FOLEY 2W 5CC 16FR (CATHETERS) ×4 IMPLANT
CATH COUDE FOLEY 2W 5CC 18FR (CATHETERS) ×4 IMPLANT
CATH COUDE FOLEY 5CC 14FR (CATHETERS) ×4 IMPLANT
CATH FOLEY 2W COUNCIL 5CC 16FR (CATHETERS) ×4 IMPLANT
CATH SET URETHRAL DILATOR (CATHETERS) ×4 IMPLANT
CEMENT HV SMART SET (Cement) ×4 IMPLANT
CHLORAPREP W/TINT 26ML (MISCELLANEOUS) ×8 IMPLANT
COOLER POLAR GLACIER W/PUMP (MISCELLANEOUS) ×4 IMPLANT
CUFF TOURN 24 STER (MISCELLANEOUS) IMPLANT
CUFF TOURN 30 N/S (MISCELLANEOUS) ×4 IMPLANT
CUFF TOURN 30 STER DUAL PORT (MISCELLANEOUS) IMPLANT
DRAPE SHEET LG 3/4 BI-LAMINATE (DRAPES) ×8 IMPLANT
ELECT CAUTERY BLADE 6.4 (BLADE) ×4 IMPLANT
ELECT REM PT RETURN 9FT ADLT (ELECTROSURGICAL) ×4
ELECTRODE REM PT RTRN 9FT ADLT (ELECTROSURGICAL) ×2 IMPLANT
GAUZE PETRO XEROFOAM 1X8 (MISCELLANEOUS) ×4 IMPLANT
GAUZE SPONGE 4X4 12PLY STRL (GAUZE/BANDAGES/DRESSINGS) ×4 IMPLANT
GLOVE BIOGEL PI IND STRL 9 (GLOVE) ×2 IMPLANT
GLOVE BIOGEL PI INDICATOR 9 (GLOVE) ×2
GLOVE INDICATOR 8.0 STRL GRN (GLOVE) ×4 IMPLANT
GLOVE SURG ORTHO 8.0 STRL STRW (GLOVE) ×4 IMPLANT
GLOVE SURG SYN 9.0  PF PI (GLOVE) ×2
GLOVE SURG SYN 9.0 PF PI (GLOVE) ×2 IMPLANT
GOWN SRG 2XL LVL 4 RGLN SLV (GOWNS) ×2 IMPLANT
GOWN STRL NON-REIN 2XL LVL4 (GOWNS) ×2
GOWN STRL REUS W/ TWL LRG LVL3 (GOWN DISPOSABLE) ×2 IMPLANT
GOWN STRL REUS W/ TWL XL LVL3 (GOWN DISPOSABLE) ×2 IMPLANT
GOWN STRL REUS W/TWL LRG LVL3 (GOWN DISPOSABLE) ×2
GOWN STRL REUS W/TWL XL LVL3 (GOWN DISPOSABLE) ×2
HOLDER FOLEY CATH W/STRAP (MISCELLANEOUS) ×4 IMPLANT
HOOD PEEL AWAY FLYTE STAYCOOL (MISCELLANEOUS) ×8 IMPLANT
IMMBOLIZER KNEE 19 BLUE UNIV (SOFTGOODS) IMPLANT
KIT TURNOVER KIT A (KITS) ×4 IMPLANT
KNEE MEDACTA TIBIAL/FEMORAL BL (Knees) ×4 IMPLANT
KNIFE SCULPS 14X20 (INSTRUMENTS) ×4 IMPLANT
NDL SAFETY ECLIPSE 18X1.5 (NEEDLE) ×2 IMPLANT
NEEDLE HYPO 18GX1.5 SHARP (NEEDLE) ×2
NEEDLE SPNL 18GX3.5 QUINCKE PK (NEEDLE) ×4 IMPLANT
NEEDLE SPNL 20GX3.5 QUINCKE YW (NEEDLE) ×4 IMPLANT
NS IRRIG 1000ML POUR BTL (IV SOLUTION) ×4 IMPLANT
PACK TOTAL KNEE (MISCELLANEOUS) ×4 IMPLANT
PAD WRAPON POLAR KNEE (MISCELLANEOUS) ×2 IMPLANT
PULSAVAC PLUS IRRIG FAN TIP (DISPOSABLE) ×4
SET AMPLATZ RENAL DILATOR (MISCELLANEOUS) ×4 IMPLANT
SET CYSTO W/LG BORE CLAMP LF (SET/KITS/TRAYS/PACK) ×4 IMPLANT
SOL .9 NS 3000ML IRR  AL (IV SOLUTION) ×2
SOL .9 NS 3000ML IRR UROMATIC (IV SOLUTION) ×2 IMPLANT
STAPLER SKIN PROX 35W (STAPLE) ×4 IMPLANT
SUCTION FRAZIER HANDLE 10FR (MISCELLANEOUS) ×2
SUCTION TUBE FRAZIER 10FR DISP (MISCELLANEOUS) ×2 IMPLANT
SUT DVC 2 QUILL PDO  T11 36X36 (SUTURE) ×2
SUT DVC 2 QUILL PDO T11 36X36 (SUTURE) ×2 IMPLANT
SUT V-LOC 90 ABS DVC 3-0 CL (SUTURE) ×4 IMPLANT
SYR 20CC LL (SYRINGE) ×4 IMPLANT
SYR 50ML LL SCALE MARK (SYRINGE) ×8 IMPLANT
TIBIAL BONE MODEL LEFT (MISCELLANEOUS) IMPLANT
TIP FAN IRRIG PULSAVAC PLUS (DISPOSABLE) ×2 IMPLANT
TOWEL OR 17X26 4PK STRL BLUE (TOWEL DISPOSABLE) ×4 IMPLANT
TOWER CARTRIDGE SMART MIX (DISPOSABLE) ×4 IMPLANT
TRAY FOLEY CATH SILVER 16FR LF (SET/KITS/TRAYS/PACK) ×4 IMPLANT
TRAY FOLEY W/METER SILVER 16FR (SET/KITS/TRAYS/PACK) ×4 IMPLANT
WRAPON POLAR PAD KNEE (MISCELLANEOUS) ×4

## 2017-11-04 NOTE — OR Nursing (Signed)
Unable to insert foley catheter after multiple attempts using different sizes and style of catheters.  Dr. Rudene Christians request for urology consult after surgery in PACU.  Anesthesia agreed.  Consult placed via charge nurse. Will notify at closing

## 2017-11-04 NOTE — H&P (Signed)
Reviewed paper H+P, will be scanned into chart. No changes noted.  

## 2017-11-04 NOTE — Transfer of Care (Signed)
Immediate Anesthesia Transfer of Care Note  Patient: Johnny Freeman  Procedure(s) Performed: TOTAL KNEE ARTHROPLASTY (Left ) CYSTOSCOPY (N/A ) URETERAL DILITATION (N/A )  Patient Location: PACU  Anesthesia Type:Spinal  Level of Consciousness: awake, alert  and oriented  Airway & Oxygen Therapy: Patient Spontanous Breathing and Patient connected to nasal cannula oxygen  Post-op Assessment: Report given to RN and Post -op Vital signs reviewed and stable  Post vital signs: Reviewed and stable  Last Vitals:  Vitals:   11/04/17 0832 11/04/17 1249  BP: 131/80 103/69  Pulse: 91 81  Resp: 16 17  Temp: 37 C   SpO2: 97%     Last Pain:  Vitals:   11/04/17 0832  TempSrc: Oral         Complications: No apparent anesthesia complications

## 2017-11-04 NOTE — Anesthesia Preprocedure Evaluation (Signed)
Anesthesia Evaluation  Patient identified by MRN, date of birth, ID band Patient awake    Reviewed: Allergy & Precautions, H&P , NPO status , Patient's Chart, lab work & pertinent test results, reviewed documented beta blocker date and time   Airway Mallampati: II   Neck ROM: full    Dental  (+) Poor Dentition, Teeth Intact   Pulmonary neg pulmonary ROS, former smoker,    Pulmonary exam normal        Cardiovascular Exercise Tolerance: Good hypertension, + CAD  negative cardio ROS Normal cardiovascular exam Rhythm:regular Rate:Normal     Neuro/Psych  Headaches, negative neurological ROS  negative psych ROS   GI/Hepatic negative GI ROS, Neg liver ROS, GERD  Medicated,  Endo/Other  negative endocrine ROS  Renal/GU negative Renal ROS  negative genitourinary   Musculoskeletal   Abdominal   Peds  Hematology negative hematology ROS (+)   Anesthesia Other Findings Past Medical History: No date: Anxiety No date: [facility])     Comment:  prostate 2011 No date: GERD (gastroesophageal reflux disease) No date: Headache     Comment:  migraines Past Surgical History: 2003: ROTATOR CUFF REPAIR; Right   Reproductive/Obstetrics negative OB ROS                             Anesthesia Physical Anesthesia Plan  ASA: III  Anesthesia Plan: General and Spinal   Post-op Pain Management:    Induction:   PONV Risk Score and Plan:   Airway Management Planned:   Additional Equipment:   Intra-op Plan:   Post-operative Plan:   Informed Consent: I have reviewed the patients History and Physical, chart, labs and discussed the procedure including the risks, benefits and alternatives for the proposed anesthesia with the patient or authorized representative who has indicated his/her understanding and acceptance.   Dental Advisory Given  Plan Discussed with: CRNA  Anesthesia Plan Comments:          Anesthesia Quick Evaluation

## 2017-11-04 NOTE — Op Note (Signed)
11/04/2017  12:26 PM  PATIENT:  Johnny Freeman  67 y.o. male  PRE-OPERATIVE DIAGNOSIS:  PRIMARY OSTEOARTHRITIS OF LEFT KNEE  POST-OPERATIVE DIAGNOSIS:  PRIMARY OSTEOARTHRITIS OF LEFT KNEE  PROCEDURE:  Procedure(s): TOTAL KNEE ARTHROPLASTY (Left)  SURGEON: Laurene Footman, MD  ASSISTANTS: Rachelle Hora [facility]  ANESTHESIA:   spinal  EBL:  Total I/O In: -  Out: 25 [Blood:25]  BLOOD ADMINISTERED:none  DRAINS: none   LOCAL MEDICATIONS USED:  MARCAINE    and OTHER Morphine, Exparel  SPECIMEN:  No Specimen  DISPOSITION OF SPECIMEN:  N/A  COUNTS:  YES  TOURNIQUET:  * Missing tourniquet times found for documented tourniquets in log: [id number] *   IMPLANTS: [place] 2 sphere left 5+ femoral component, 5 tibia baseplate with 10 mm insert and short stem, 1 patella, all components cemented  DICTATION: .Dragon Dictation  patient brought the operating room and after adequate anesthesia was obtained the left leg was prepped and draped in sterile fashion was turned by the upper thigh. After patient identification and timeout procedures were completed, tourniquet raisedmidline incision was made followed by medial parapatellar arthrotomy. Inspection revealed extensive degenerative changes throughout the knee particularly medialcompartment there is exposed bone over the entire condyle tibial and femoral bone loss. Is also moderate synovitis which was subsequently excised. Fat pad and PCL and anterior cruciate ligament were excised at this time and proximal tibia cutting block applied with proximal tibia cut carried out.. The 4-in-1 cutting block was applied anterior posterior and chamfer cuts made. The proximal tibia was prepared with removal of posterior horns of menisci. Placement of the 5 tibia baseplate and proximal tibial preparation with drillingwith proximal preparation for short stem. With the trial baseplate in place a 5+femur trial was placed and a 5mm insert gave excellent stability.  Distal femoral drill holes were made followed by the trochlear groove cut with the reaming for the trochlear groove. These trials were then removed and the patella cut using the patellar cutting guide and measured to a size 1.  injection of the above local was placed and the tourniquet then raised. Bony surfaces were thoroughly irrigated and dried,the tibial component was cemented into place first followed by the placement of the polyethylene component, set screw with torque screwdriver and femoral component with excess cement removed and the knee held in extension patellar button was then clamped into place after the cement had set the patella did track well, tourniquet then let down and bleeding checked electrocautery. After thorough irrigation of the knee the arthrotomy was repaired heavy Quill suture to close the capsule.3-0 v-locsubcutaneously followed by skin staples and incisional wound VAC, followed by blue towel and Polar Care    PLAN OF CARE: Admit to inpatient   PATIENT DISPOSITION:  PACU - hemodynamically stable.

## 2017-11-04 NOTE — Op Note (Signed)
Date of procedure: 11/04/17  Preoperative diagnosis:  1. History of prostate cancer 2. Difficult Foley  Postoperative diagnosis:  1. Same as above 2. Bladder neck contracture  Procedure: 1. Cystoscopy 2. Dilation of bladder neck 3. Foley catheter placement  Surgeon: Hollice Espy, MD  Anesthesia: Spinal anesthesia with sedation  Complications: None  Intraoperative findings: Tight approximately 8 French fibrotic bladder neck contracture, initially had difficulty placing wire even under direct vision.  Dilated from 8 [place] up to 50 [place].  16 French placed.  EBL: Minimal  Specimens: None  Drains: 16 French council tip Foley catheter  Indication: Johnny Freeman is a 67 y.o. patient with personal history of prostate cancer followed by Dr. Yves Dill status post ? HIFU undergoing total knee procedure by Dr. Rudene Christians.  The beginning of the case, they were unable to place multiple catheters including smaller 12 [place] up to 50 [place] using both normal and daily catheter types.  Urology was called at the end of the procedure to help assist with placement of a Foley catheter.  Description of procedure:  The patient was prepped and draped in the standard sterile fashion.  Betadine was applied per urethral meatus.  A flexible 54 French cystoscope was advanced per urethra to the prostatic fossa.  At the bladder neck, a very dense stricture was appreciated and the lumen was very difficult to visualize.  Ultimately under direct visualization, I was able to place a Super Stiff wire.  This is difficult at first but ultimately was able to pass it into the bladder.  It was not able to advance the scope into the bladder itself.  I then used a Cook serial dilators starting with 8 [place] all the way up to 39 French to dilate the bladder neck.  Of note, but I feel it was quite fibrotic and dense.  Ultimately over the Super Stiff wire, I was able to advance a 16 [place] council tip catheter over the wire into  the bladder.  The balloon was filled with 10 cc of sterile water.  The wire was then removed.  Clear yellow urine was drained from the bladder.  Plan: Case is discussed with Dr. Rudene Christians.  I would like the patient to keep his catheter for 7 days in the setting of need for bladder neck dilation.  He can follow-up with Dr. Yves Dill if he still follows with him.  Otherwise he can return to our office in 7 days for voiding trial.  Hollice Espy, M.D.

## 2017-11-04 NOTE — Progress Notes (Signed)
PT Cancellation Note  Patient Details Name: Johnny Freeman MRN: [id number] DOB: 11/11/1950   Cancelled Treatment:    Reason Eval/Treat Not Completed: Patient declined, no reason specified Pt reports that he is still having severe pain and does not feel as though he could participate with PT today. He is eager to work with PT tomorrow and is hoping to get pain under control. Kreg Shropshire, DPT 11/04/2017, 5:17 PM

## 2017-11-04 NOTE — Progress Notes (Signed)
Pt BP low, HR low. Pt states nauseated. Pt has become pale. Pt placed in trendelenburg, fluids increased, and spoke with Dr. Kayleen Memos as he was in the PACU at the time. Acknowledged. Orders received. Johnny Freeman E 1:15 PM 11/04/2017

## 2017-11-04 NOTE — Anesthesia Post-op Follow-up Note (Signed)
Anesthesia QCDR form completed.        

## 2017-11-04 NOTE — NC FL2 (Signed)
[place] LEVEL OF CARE SCREENING TOOL     IDENTIFICATION  Patient Name: Johnny Freeman Birthdate: May 22, 1951 Sex: male Admission Date (Current Location): 11/04/2017  [place] and [place] Number:  Engineering geologist and Address:  [facility], [address], [place], [place] [address]      Provider Number: [id number]  Attending Physician Name and Address:  Hessie Knows, MD  Relative Name and Phone Number:       Current Level of Care: Hospital Recommended Level of Care: [place] Prior Approval Number:    Date Approved/Denied:   PASRR Number: [id number])  Discharge Plan: SNF    Current Diagnoses: Patient Active Problem List   Diagnosis Date Noted  . Primary localized osteoarthritis of left knee 11/04/2017  . Obesity, unspecified 04/17/2017  . CAD (coronary artery disease) 12/26/2014  . Hyperlipidemia 12/26/2014  . Hypertension 12/26/2014    Orientation RESPIRATION BLADDER Height & Weight     Self, Time, Situation, Place  Normal Continent Weight:   Height:     BEHAVIORAL SYMPTOMS/MOOD NEUROLOGICAL BOWEL NUTRITION STATUS      Continent Diet  AMBULATORY STATUS COMMUNICATION OF NEEDS Skin   Extensive Assist Verbally Surgical wounds(Incision Left Knee)                       Personal Care Assistance Level of Assistance  Bathing, Feeding, Dressing Bathing Assistance: Limited assistance Feeding assistance: Independent Dressing Assistance: Limited assistance     Functional Limitations Info  Sight, Hearing, Speech Sight Info: Adequate Hearing Info: Adequate Speech Info: Adequate    SPECIAL CARE FACTORS FREQUENCY  PT (By licensed PT), OT (By licensed OT)     PT Frequency: (5) OT Frequency: (5)            Contractures      Additional Factors Info  Code Status, Allergies Code Status Info: (Full Code) Allergies Info: (SHELLFISH ALLERGY )           Current Medications (11/04/2017):  This  is the current hospital active medication list Current Facility-Administered Medications  Medication Dose Route Frequency Provider Last Rate Last Dose  . 0.9 %  sodium chloride infusion   Intravenous Continuous Hessie Knows, MD      . acetaminophen (TYLENOL) tablet 650 mg  650 mg Oral Q4H PRN Hessie Knows, MD       Or  . acetaminophen (TYLENOL) suppository 650 mg  650 mg Rectal Q4H PRN Hessie Knows, MD      . Derrill Memo ON 11/05/2017] amLODipine (NORVASC) tablet 5 mg  5 mg Oral Daily Hessie Knows, MD       And  . Derrill Memo ON 11/05/2017] benazepril (LOTENSIN) tablet 20 mg  20 mg Oral Daily Hessie Knows, MD      . aspirin EC tablet 162 mg  162 mg Oral Daily Hessie Knows, MD      . atropine 1 MG/10ML injection           . bisacodyl (DULCOLAX) suppository 10 mg  10 mg Rectal Daily PRN Hessie Knows, MD      . ceFAZolin (ANCEF) IVPB 2g/100 mL premix  2 g Intravenous Q6H Hessie Knows, MD      . Derrill Memo ON 11/05/2017] cholecalciferol (VITAMIN D) tablet 5,000 Units  5,000 Units Oral Q lunch Hessie Knows, MD      . diphenhydrAMINE (BENADRYL) 12.5 MG/5ML elixir 12.5-25 mg  12.5-25 mg Oral Q4H PRN Hessie Knows, MD      .  docusate sodium (COLACE) capsule 100 mg  100 mg Oral Daily Hessie Knows, MD      . docusate sodium (COLACE) capsule 100 mg  100 mg Oral BID Hessie Knows, MD      . Derrill Memo ON 11/05/2017] enoxaparin (LOVENOX) injection 30 mg  30 mg Subcutaneous Q12H Hessie Knows, MD      . magnesium citrate solution 1 Bottle  1 Bottle Oral Once PRN Hessie Knows, MD      . magnesium hydroxide (MILK OF MAGNESIA) suspension 30 mL  30 mL Oral Daily PRN Hessie Knows, MD      . menthol-cetylpyridinium (CEPACOL) lozenge 3 mg  1 lozenge Oral PRN Hessie Knows, MD       Or  . phenol (CHLORASEPTIC) mouth spray 1 spray  1 spray Mouth/Throat PRN Hessie Knows, MD      . methocarbamol (ROBAXIN) tablet 500 mg  500 mg Oral Q6H PRN Hessie Knows, MD       Or  . methocarbamol (ROBAXIN) 500 mg in dextrose 5 % 50 mL IVPB   500 mg Intravenous Q6H PRN Hessie Knows, MD      . metoCLOPramide (REGLAN) tablet 5-10 mg  5-10 mg Oral Q8H PRN Hessie Knows, MD       Or  . metoCLOPramide (REGLAN) injection 5-10 mg  5-10 mg Intravenous Q8H PRN Hessie Knows, MD      . morphine 2 MG/ML injection 2 mg  2 mg Intravenous Q1H PRN Hessie Knows, MD      . multivitamin with minerals tablet 1 tablet  1 tablet Oral Daily Hessie Knows, MD      . omega-3 acid ethyl esters (LOVAZA) capsule 1 g  1 g Oral BID Hessie Knows, MD      . ondansetron [facility]) 4 MG/2ML injection           . ondansetron (ZOFRAN) tablet 4 mg  4 mg Oral Q6H PRN Hessie Knows, MD       Or  . ondansetron [facility]) injection 4 mg  4 mg Intravenous Q6H PRN Hessie Knows, MD      . oxyCODONE (Oxy IR/ROXICODONE) immediate release tablet 10 mg  10 mg Oral Q3H PRN Hessie Knows, MD      . oxyCODONE (Oxy IR/ROXICODONE) immediate release tablet 5 mg  5 mg Oral Q3H PRN Hessie Knows, MD      . Derrill Memo ON 11/05/2017] pantoprazole (PROTONIX) EC tablet 80 mg  80 mg Oral Daily Hessie Knows, MD      . vitamin C (ASCORBIC ACID) tablet 500 mg  500 mg Oral Daily Hessie Knows, MD      . zolpidem (AMBIEN) tablet 5 mg  5 mg Oral QHS PRN Hessie Knows, MD         Discharge Medications: Please see discharge summary for a list of discharge medications.  Relevant Imaging Results:  Relevant Lab Results:   Additional Information (SSN: [id number])  Smith Mince, Student-Social Work

## 2017-11-04 NOTE — Anesthesia Postprocedure Evaluation (Signed)
Anesthesia Post Note  Patient: Johnny Freeman  Procedure(s) Performed: TOTAL KNEE ARTHROPLASTY (Left ) CYSTOSCOPY (N/A ) URETERAL DILITATION (N/A )  Patient location during evaluation: PACU Anesthesia Type: General Level of consciousness: awake and alert Pain management: pain level controlled Vital Signs Assessment: post-procedure vital signs reviewed and stable Respiratory status: spontaneous breathing, nonlabored ventilation, respiratory function stable and patient connected to nasal cannula oxygen Cardiovascular status: blood pressure returned to baseline and stable Postop Assessment: no apparent nausea or vomiting Anesthetic complications: no     Last Vitals:  Vitals:   11/04/17 1249 11/04/17 1250  BP: 103/69 103/69  Pulse: 81 78  Resp: 17 17  Temp:    SpO2:  100%    Last Pain:  Vitals:   11/04/17 0832  TempSrc: Oral                 Molli Barrows

## 2017-11-04 NOTE — Anesthesia Procedure Notes (Signed)
Spinal  Patient location during procedure: OR End time: 11/04/2017 10:06 AM Staffing Anesthesiologist: Molli Barrows, MD Resident/CRNA: Jonna Clark, CRNA Performed: resident/CRNA  Preanesthetic Checklist Completed: patient identified, site marked, surgical consent, pre-op evaluation, timeout performed, IV checked, risks and benefits discussed and monitors and equipment checked Spinal Block Patient position: sitting Prep: Betadine Patient monitoring: heart rate, continuous pulse ox, blood pressure and cardiac monitor Approach: midline Location: L4-5 Injection technique: single-shot Needle Needle type: [place] and Introducer  Needle gauge: 24 G Needle length: 9 cm Assessment Sensory level: T8 Additional Notes Negative paresthesia. Negative blood return. Positive free-flowing CSF. Expiration date of kit checked and confirmed. Patient tolerated procedure well, without complications.

## 2017-11-05 ENCOUNTER — Encounter: Payer: Self-pay | Admitting: Orthopedic Surgery

## 2017-11-05 LAB — BASIC METABOLIC PANEL
Anion gap: 9 (ref 5–15)
BUN: 10 mg/dL (ref 6–20)
CHLORIDE: 97 mmol/L — AB (ref 101–111)
CO2: 25 mmol/L (ref 22–32)
Calcium: 8.5 mg/dL — ABNORMAL LOW (ref 8.9–10.3)
Creatinine, Ser: 0.99 mg/dL (ref 0.61–1.24)
GFR calc non Af Amer: >60 mL/min (ref >60)
GLUCOSE: 144 mg/dL — AB (ref 65–99)
Potassium: 4.4 mmol/L (ref 3.5–5.1)
Sodium: 131 mmol/L — ABNORMAL LOW (ref 135–145)

## 2017-11-05 LAB — CBC
HEMATOCRIT: 36.1 % — AB (ref 40.0–52.0)
Hemoglobin: 12.3 g/dL — ABNORMAL LOW (ref 13.0–18.0)
MCH: 29.7 pg (ref 26.0–34.0)
MCHC: 34 g/dL (ref 32.0–36.0)
MCV: 87.4 fL (ref 80.0–100.0)
Platelets: 259 10*3/uL (ref 150–440)
RBC: 4.13 MIL/uL — ABNORMAL LOW (ref 4.40–5.90)
RDW: 13.2 % (ref 11.5–14.5)
WBC: 12.7 10*3/uL — ABNORMAL HIGH (ref 3.8–10.6)

## 2017-11-05 NOTE — Progress Notes (Signed)
   Subjective: 1 Day Post-Op Procedure(s) (LRB): TOTAL KNEE ARTHROPLASTY (Left) CYSTOSCOPY (N/A) URETERAL DILITATION (N/A) Patient reports pain as 5 on 0-10 scale. No pain meds in 5 hrs.   Patient is well, and has had no acute complaints or problems Denies any CP, SOB, ABD pain. We will continue therapy today.  Plan is to go Home after hospital stay.  Objective: Vital signs in last 24 hours: Temp:  [97.4 F (36.3 C)-98.6 F (37 C)] 98 F (36.7 C) (02/06 0351) Pulse Rate:  [49-102] 88 (02/06 0351) Resp:  [11-19] 18 (02/06 0351) BP: (66-135)/(45-83) 131/77 (02/06 0351) SpO2:  [90 %-100 %] 95 % (02/06 0351) Weight:  [97.5 kg (215 lb)] 97.5 kg (215 lb) (02/05 1553)  Intake/Output from previous day: 02/05 0701 - 02/06 0700 In: 2731.3 [P.O.:480; I.V.:2051.3; IV Piggyback:200] Out: 825 [Urine:800; Blood:25] Intake/Output this shift: No intake/output data recorded.  Recent Labs    11/04/17 1512 11/05/17 0336  HGB 13.1 12.3*   Recent Labs    11/04/17 1512 11/05/17 0336  WBC 11.6* 12.7*  RBC 4.43 4.13*  HCT 38.9* 36.1*  PLT 244 259   Recent Labs    11/04/17 1512 11/05/17 0336  NA  --  131*  K  --  4.4  CL  --  97*  CO2  --  25  BUN  --  10  CREATININE 0.93 0.99  GLUCOSE  --  144*  CALCIUM  --  8.5*   No results for input(s): LABPT, INR in the last 72 hours.  EXAM General - Patient is Alert, Appropriate and Oriented Extremity - Neurovascular intact Sensation intact distally Intact pulses distally Dorsiflexion/Plantar flexion intact No cellulitis present Compartment soft Dressing - dressing C/D/I and no drainage Motor Function - intact, moving foot and toes well on exam.   Past Medical History:  Diagnosis Date  . Anxiety   . Cancer [facility])    prostate 2011  . GERD (gastroesophageal reflux disease)   . Headache    migraines    Assessment/Plan:   1 Day Post-Op Procedure(s) (LRB): TOTAL KNEE ARTHROPLASTY (Left) CYSTOSCOPY (N/A) URETERAL DILITATION  (N/A) Active Problems:   Primary localized osteoarthritis of left knee  Estimated body mass index is 33.67 kg/m as calculated from the following:   Height as of this encounter: 5\' 7"  (1.702 m).   Weight as of this encounter: 97.5 kg (215 lb). Advance diet Up with therapy  Needs BM Recheck labs in the am CM to assist with discharge Continue with Foley catheter, follow up with Urology in 7 days for foley removal  DVT Prophylaxis - Lovenox, Foot Pumps and TED hose Weight-Bearing as tolerated to left leg   T. Rachelle Hora, PA-C [place] 11/05/2017, 8:13 AM

## 2017-11-05 NOTE — Progress Notes (Signed)
Clinical Social Worker (CSW) received SNF consult. PT is recommending home health. RN case manager aware of above. Please reconsult if future social work needs arise. CSW signing off.   Karisma Meiser, LCSW [phone] 

## 2017-11-05 NOTE — Progress Notes (Signed)
Pt alert and oriented X4. Resting in bed. Wife at bedside. IV infusing.Pt on room air. Pt complains of knee pain. PRN oxycodone and robaxin given today. Polar care running. Urinary catheter in place and has adequate output. Pt up with PT today and tolerated well. No other complaints at this time.

## 2017-11-05 NOTE — Evaluation (Signed)
Occupational Therapy Evaluation Patient Details Name: Johnny Freeman MRN: [id number] DOB: 14-Feb-1951 Today's Date: 11/05/2017    History of Present Illness Pt. is a 67 y.o. male who was admitted to [facility] for a Left TKR. Pt. PMHx includes: Anxiety, CA, GERD, Headache   Clinical Impression   Pt. presents with 8/10 pain, weakness, and limited mobility which limit his ability to complete ADL, and IADL tasks. Pt was medicated prior to session.Pt. resides at home with his wife. Pt. Was independent with ADLs, and IADLs prior to admission. Pt. Was able to drive, and was working as an Clinical biochemist. Pt. Education was provided verbally about A/E use for LE ADLs. Pt. Reports having a LH shoehorn at home. Pt. Could benefit from OT services for ADL training, A/E training, and pt. Education about home modification, and DME. Pt. Plans to return home with his wife to assist with ADLs, and IADLs as needed.     Follow Up Recommendations  No OT follow up    Equipment Recommendations       Recommendations for Other Services       Precautions / Restrictions Precautions Precautions: Fall;Knee Precaution Booklet Issued: No Restrictions Weight Bearing Restrictions: Yes LLE Weight Bearing: Weight bearing as tolerated      Mobility Bed Mobility Overal bed mobility: Needs Assistance Bed Mobility: Supine to Sit     Supine to sit: Min guard     Transfers Overall transfer level: Needs assistance Equipment used: Rolling walker (2 wheeled) Transfers: Sit to/from Stand Sit to Stand: Min guard         General transfer comment: Mobility per PT report        ADL either performed or assessed with clinical judgement   ADL Overall ADL's : Needs assistance/impaired Eating/Feeding: Set up;Independent;Bed level   Grooming: Bed level;Independent;Set up   Upper Body Bathing: Set up;Independent;Bed level   Lower Body Bathing: Moderate assistance;Bed level   Upper Body Dressing : Set  up;Independent;Bed level   Lower Body Dressing: Moderate assistance;Independent;Set up;Bed level               Functional mobility during ADLs: Min guard General ADL Comments: Pt. edcuation was provided about A/E use for LE ADLs.     Vision Patient Visual Report: No change from baseline       Perception     Praxis      Pertinent Vitals/Pain Pain Assessment: 0-10 Pain Score: 8  Pain Location: Left Knee Pain Descriptors / Indicators: Aching Pain Intervention(s): Limited activity within patient's tolerance;Premedicated before session     Hand Dominance Right   Extremity/Trunk Assessment Upper Extremity Assessment Upper Extremity Assessment: Overall WFL for tasks assessed(History of right RTC surgery.)       Communication Communication Communication: No difficulties   Cognition Arousal/Alertness: Awake/alert Behavior During Therapy: WFL for tasks assessed/performed Overall Cognitive Status: Within Functional Limits for tasks assessed                                     General Comments       Exercises   Shoulder Instructions      Home Living Family/patient expects to be discharged to:: Private residence Living Arrangements: Spouse/significant other Available Help at Discharge: Available 24 hours/day Type of Home: House Home Access: Stairs to enter [employer] of Steps: 2 Entrance Stairs-Rails: Right Home Layout: One level     Bathroom Shower/Tub: Tub/shower unit;Curtain  Home Equipment: Grab bars - tub/shower;Hand held shower head          Prior Functioning/Environment Level of Independence: Independent    ADL's / Homemaking Assistance Needed: Pt. was independent with ADLs, and IADLs prior to onset. Pt. was able to drive, and was working as as an Clinical biochemist.           OT Problem List: Decreased strength;Pain;Decreased activity tolerance;Decreased range of motion;Decreased knowledge of use of DME or AE       OT Treatment/Interventions: Self-care/ADL training;DME and/or AE instruction;Therapeutic exercise;Patient/family education    OT Goals(Current goals can be found in the care plan section) Acute Rehab OT Goals Patient Stated Goal: To decrease pain OT Goal Formulation: With patient Potential to Achieve Goals: Good  OT Frequency: Min 1X/week   Barriers to D/C:            Co-evaluation              AM-PAC PT "6 Clicks" Daily Activity     Outcome Measure Help from another person eating meals?: None Help from another person taking care of personal grooming?: None Help from another person toileting, which includes using toliet, bedpan, or urinal?: A Little Help from another person bathing (including washing, rinsing, drying)?: A Lot Help from another person to put on and taking off regular upper body clothing?: A Little Help from another person to put on and taking off regular lower body clothing?: A Lot 6 Click Score: 18   End of Session    Activity Tolerance: Patient tolerated treatment well Patient left: in bed  OT Visit Diagnosis: Muscle weakness (generalized) (M62.81);Pain                Time: 1140-1200 OT Time Calculation (min): 20 min Charges:  OT General Charges $OT Visit: 1 Visit OT Evaluation $OT Eval Low Complexity: 1 Low G-Codes:     Harrel Carina, MS, OTR/L   Harrel Carina, MS, OTR/L 11/05/2017, 12:10 PM

## 2017-11-05 NOTE — Care Management Note (Signed)
Case Management Note  Patient Details  Name: Johnny Freeman MRN: [id number] Date of Birth: [birth year]  Subjective/Objective:    Admitted to [facility] post cystoscopy and foley placement. Lives with wife. Sees Dr. Wynetta Emery as primary care physician. Prescriptions are filled at [facility], will be moving to 11/19/17 to [employer] No home health. No skilled nursing. No equipment in the home. Still works full time. Takes care of all basic activities of daily living himself, drives. No falls. Good appetite, Wife will transport                Action/Plan: Physical therapy pending. [place], rolling walker, and bedside bed, if home health recommended   Expected Discharge Date:  11/06/17               Expected Discharge Plan:     In-House Referral:   yes  Discharge planning Services   yes  Post Acute Care Choice:   yes Choice offered to:   patient  DME Arranged:   yes, will be DME Agency:   [facility]  HH Arranged:   yes, will be HH Agency:   [facility]  Status of Service:     If discussed at [employer], dates discussed:    Additional Comments:  Shelbie Ammons, RN MSN CCM Care Management [phone] 11/05/2017, 9:04 AM

## 2017-11-05 NOTE — Evaluation (Addendum)
Physical Therapy Evaluation Patient Details Name: Johnny Freeman MRN: [id number] DOB: Jul 20, 1951 Today's Date: 11/05/2017   History of Present Illness  Pt admitted for L TKR.   Clinical Impression  Pt is a pleasant 67 year old male who was admitted for L TKR. Pt performs bed mobility, transfers, and ambulation with cga and RW. Pt demonstrates deficits with strength/pain/mobility. Pt appears very motivated to participate, however is pain limited. Educated on maintaining knee extension. Per patient cath will remain for 1 week. Pt demonstrates ability to perform 10 SLRs with independence, therefore does not require KI for mobility. Would benefit from skilled PT to address above deficits and promote optimal return to PLOF. Recommend transition to [place] upon discharge from acute hospitalization.       Follow Up Recommendations Home health PT    Equipment Recommendations  Rolling walker with 5" wheels;3in1 (PT)    Recommendations for Other Services       Precautions / Restrictions Precautions Precautions: Fall;Knee Precaution Booklet Issued: No Restrictions Weight Bearing Restrictions: Yes LLE Weight Bearing: Weight bearing as tolerated      Mobility  Bed Mobility Overal bed mobility: Needs Assistance Bed Mobility: Supine to Sit     Supine to sit: Min guard     General bed mobility comments: needs cues for sequencing. Safe technique performed  Transfers Overall transfer level: Needs assistance Equipment used: Rolling walker (2 wheeled) Transfers: Sit to/from Stand Sit to Stand: Min guard         General transfer comment: bed elevated for ease. Upright posture noted with guarding of L LE.  Ambulation/Gait Ambulation/Gait assistance: Min guard Ambulation Distance (Feet): 25 Feet Assistive device: Rolling walker (2 wheeled) Gait Pattern/deviations: Step-to pattern     General Gait Details: antalgic gait pattern performed on L LE with small steps noted. RW used with  sequencing for correct technique. Pt fatigues with ambulation due to pain, requesting to return to recliner.  Stairs            Wheelchair Mobility    Modified Rankin (Stroke Patients Only)       Balance Overall balance assessment: Needs assistance Sitting-balance support: Feet supported Sitting balance-Leahy Scale: Good     Standing balance support: Bilateral upper extremity supported Standing balance-Leahy Scale: Good                               Pertinent Vitals/Pain Pain Assessment: 0-10 Pain Score: 8  Pain Location: L knee Pain Descriptors / Indicators: Operative site guarding;Aching Pain Intervention(s): Limited activity within patient's tolerance;Premedicated before session;Patient requesting pain meds-RN notified;Ice applied    Home Living Family/patient expects to be discharged to:: Private residence Living Arrangements: Spouse/significant other Available Help at Discharge: Available 24 hours/day Type of Home: House Home Access: Stairs to enter Entrance Stairs-Rails: Right Entrance Stairs-Number of Steps: 2 Home Layout: One level(does have 1 step into living room) Home Equipment: None      Prior Function Level of Independence: Independent         Comments: family reports he occasionally used furniture for ambulation, reports no falls     Hand Dominance        Extremity/Trunk Assessment   Upper Extremity Assessment Upper Extremity Assessment: Overall WFL for tasks assessed    Lower Extremity Assessment Lower Extremity Assessment: Generalized weakness(L LE grossly 3/5; R LE grossly 5/5)       Communication   Communication: No difficulties  Cognition Arousal/Alertness:  Awake/alert Behavior During Therapy: WFL for tasks assessed/performed Overall Cognitive Status: Within Functional Limits for tasks assessed                                        General Comments      Exercises Total Joint  Exercises Goniometric ROM: L knee AAROM: 15-65 degrees Other Exercises Other Exercises: supine ther-ex performed on L LE including ankle pumps, quad sets, SLRs, and hip abd/add . All ther-ex performed x 10 reps with min assist. Safe technique performed   Assessment/Plan    PT Assessment Patient needs continued PT services  PT Problem List Decreased strength;Decreased range of motion;Decreased activity tolerance;Decreased balance;Decreased mobility;Decreased knowledge of use of DME;Pain       PT Treatment Interventions DME instruction;Gait training;Stair training;Therapeutic exercise;Balance training    PT Goals (Current goals can be found in the Care Plan section)  Acute Rehab PT Goals Patient Stated Goal: to get stronger PT Goal Formulation: With patient Time For Goal Achievement: 11/19/17 Potential to Achieve Goals: Good Additional Goals Additional Goal #1: Pt will be able to perform bed mobility/transfers with supervision and safe technique in order to improve functional independence    Frequency BID   Barriers to discharge        Co-evaluation               AM-PAC PT "6 Clicks" Daily Activity  Outcome Measure Difficulty turning over in bed (including adjusting bedclothes, sheets and blankets)?: Unable Difficulty moving from lying on back to sitting on the side of the bed? : Unable Difficulty sitting down on and standing up from a chair with arms (e.g., wheelchair, bedside commode, etc,.)?: Unable Help needed moving to and from a bed to chair (including a wheelchair)?: A Little Help needed walking in hospital room?: A Little Help needed climbing 3-5 steps with a railing? : A Lot 6 Click Score: 11    End of Session Equipment Utilized During Treatment: Gait belt Activity Tolerance: Patient limited by pain Patient left: in chair;with chair alarm set(requesting to leave SCD off secondary to muscle spasm) Nurse Communication: Mobility status PT Visit Diagnosis:  Muscle weakness (generalized) (M62.81);Difficulty in walking, not elsewhere classified (R26.2);Pain Pain - Right/Left: Left Pain - part of body: Knee    Time: 0930-1000 PT Time Calculation (min) (ACUTE ONLY): 30 min   Charges:   PT Evaluation $PT Eval Low Complexity: 1 Low PT Treatments $Therapeutic Exercise: 8-22 mins   PT G Codes:        Greggory Stallion, PT, DPT [phone]   Johnny Freeman 11/05/2017, 10:43 AM

## 2017-11-05 NOTE — Progress Notes (Signed)
Physical Therapy Treatment Patient Details Name: Johnny Freeman MRN: [id number] DOB: Mar 11, 1951 Today's Date: 11/05/2017    History of Present Illness Pt. is a 67 y.o. male who was admitted to [facility] for a Left TKR. Pt. PMHx includes: Anxiety, CA, GERD, Headache    PT Comments    Pt is making small progress towards goals due to pain issues. With increased ambulation, pt reports severe pain and starts trembling due to fatigue. Pulled up chair for rest break. Needs cues for sequencing, follows commands well. Good endurance with there-ex. Will continue to progress. Supportive wife.   Follow Up Recommendations  Home health PT     Equipment Recommendations  Rolling walker with 5" wheels;3in1 (PT)    Recommendations for Other Services       Precautions / Restrictions Precautions Precautions: Fall;Knee Precaution Booklet Issued: No Restrictions Weight Bearing Restrictions: Yes LLE Weight Bearing: Weight bearing as tolerated    Mobility  Bed Mobility Overal bed mobility: Needs Assistance Bed Mobility: Supine to Sit     Supine to sit: Min guard     General bed mobility comments: needs cues for sequencing. Safe technique performed  Transfers Overall transfer level: Needs assistance Equipment used: Rolling walker (2 wheeled) Transfers: Sit to/from Stand Sit to Stand: Min assist         General transfer comment: Pt able to stand from lower surface this session, however needs slight assist and cues for sequencing  Ambulation/Gait Ambulation/Gait assistance: Min guard Ambulation Distance (Feet): 50 Feet Assistive device: Rolling walker (2 wheeled) Gait Pattern/deviations: Step-to pattern     General Gait Details: improved step length noted, although still antalgic. Pt fatigues quickly, needs seated rest break during ambulation. Trembling noted   Stairs            Wheelchair Mobility    Modified Rankin (Stroke Patients Only)       Balance                                             Cognition Arousal/Alertness: Awake/alert Behavior During Therapy: WFL for tasks assessed/performed Overall Cognitive Status: Within Functional Limits for tasks assessed                                        Exercises Other Exercises Other Exercises: supine ther-ex performed on L LE including ankle pumps, SAQ, quad sets, SLRs, and hip abd/add . All ther-ex performed x 10 reps with min assist. Safe technique performed Other Exercises: Ambulated to bathroom with cga and RW. Safe technique performed. Left in bathroom with instructions to pull cord. RN notified.    General Comments        Pertinent Vitals/Pain Pain Assessment: 0-10 Pain Score: 6  Pain Location: Left Knee Pain Descriptors / Indicators: Aching Pain Intervention(s): Limited activity within patient's tolerance;Premedicated before session;Ice applied    Home Living                      Prior Function            PT Goals (current goals can now be found in the care plan section) Acute Rehab PT Goals Patient Stated Goal: To decrease pain PT Goal Formulation: With patient Time For Goal Achievement: 11/19/17 Potential to Achieve Goals: Good  Progress towards PT goals: Progressing toward goals    Frequency    BID      PT Plan Current plan remains appropriate    Co-evaluation              AM-PAC PT "6 Clicks" Daily Activity  Outcome Measure  Difficulty turning over in bed (including adjusting bedclothes, sheets and blankets)?: Unable Difficulty moving from lying on back to sitting on the side of the bed? : Unable Difficulty sitting down on and standing up from a chair with arms (e.g., wheelchair, bedside commode, etc,.)?: Unable Help needed moving to and from a bed to chair (including a wheelchair)?: A Little Help needed walking in hospital room?: A Little Help needed climbing 3-5 steps with a railing? : A Lot 6 Click Score: 11     End of Session Equipment Utilized During Treatment: Gait belt Activity Tolerance: Patient limited by pain Patient left: with family/visitor present(left in bathroom) Nurse Communication: Mobility status PT Visit Diagnosis: Muscle weakness (generalized) (M62.81);Difficulty in walking, not elsewhere classified (R26.2);Pain Pain - Right/Left: Left Pain - part of body: Knee     Time: 5956-3875 PT Time Calculation (min) (ACUTE ONLY): 23 min  Charges:  $Gait Training: 8-22 mins $Therapeutic Exercise: 8-22 mins                    G Codes:       Greggory Stallion, PT, DPT [phone]    Johnny Freeman 11/05/2017, 4:34 PM

## 2017-11-06 ENCOUNTER — Inpatient Hospital Stay: Payer: Managed Care, Other (non HMO)

## 2017-11-06 ENCOUNTER — Encounter: Payer: Self-pay | Admitting: Radiology

## 2017-11-06 LAB — TSH: TSH: 3.324 u[IU]/mL (ref 0.350–4.500)

## 2017-11-06 LAB — BASIC METABOLIC PANEL
ANION GAP: 9 (ref 5–15)
BUN: 13 mg/dL (ref 6–20)
CALCIUM: 8.6 mg/dL — AB (ref 8.9–10.3)
CO2: 27 mmol/L (ref 22–32)
Chloride: 97 mmol/L — ABNORMAL LOW (ref 101–111)
Creatinine, Ser: 0.97 mg/dL (ref 0.61–1.24)
GFR calc non Af Amer: >60 mL/min (ref >60)
Glucose, Bld: 143 mg/dL — ABNORMAL HIGH (ref 65–99)
POTASSIUM: 4.3 mmol/L (ref 3.5–5.1)
Sodium: 133 mmol/L — ABNORMAL LOW (ref 135–145)

## 2017-11-06 LAB — CBC
HEMATOCRIT: 34.8 % — AB (ref 40.0–52.0)
HEMOGLOBIN: 11.9 g/dL — AB (ref 13.0–18.0)
MCH: 30 pg (ref 26.0–34.0)
MCHC: 34.2 g/dL (ref 32.0–36.0)
MCV: 87.7 fL (ref 80.0–100.0)
Platelets: 230 10*3/uL (ref 150–440)
RBC: 3.96 MIL/uL — AB (ref 4.40–5.90)
RDW: 13.6 % (ref 11.5–14.5)
WBC: 10.9 10*3/uL — AB (ref 3.8–10.6)

## 2017-11-06 LAB — URINALYSIS, COMPLETE (UACMP) WITH MICROSCOPIC
BACTERIA UA: NONE SEEN
Bilirubin Urine: NEGATIVE
Glucose, UA: NEGATIVE mg/dL
Ketones, ur: NEGATIVE mg/dL
LEUKOCYTES UA: NEGATIVE
NITRITE: NEGATIVE
PH: 6 (ref 5.0–8.0)
Protein, ur: 30 mg/dL — AB
SPECIFIC GRAVITY, URINE: 1.019 (ref 1.005–1.030)
Squamous Epithelial / LPF: NONE SEEN

## 2017-11-06 LAB — TROPONIN I
TROPONIN I: 0.21 ng/mL — AB (ref <0.03)
Troponin I: <0.03 ng/mL (ref <0.03)

## 2017-11-06 MED ORDER — DILTIAZEM HCL ER 60 MG PO CP12
120.0000 mg | ORAL_CAPSULE | Freq: Once | ORAL | Status: AC
  Administered 2017-11-06: 120 mg via ORAL
  Filled 2017-11-06: qty 2

## 2017-11-06 MED ORDER — SODIUM CHLORIDE 0.9 % IV BOLUS (SEPSIS)
500.0000 mL | Freq: Once | INTRAVENOUS | Status: AC
  Administered 2017-11-06: 500 mL via INTRAVENOUS

## 2017-11-06 MED ORDER — IOPAMIDOL (ISOVUE-370) INJECTION 76%
75.0000 mL | Freq: Once | INTRAVENOUS | Status: AC | PRN
  Administered 2017-11-06: 75 mL via INTRAVENOUS

## 2017-11-06 MED ORDER — DILTIAZEM HCL ER COATED BEADS 300 MG PO CP24
300.0000 mg | ORAL_CAPSULE | Freq: Every day | ORAL | Status: DC
  Administered 2017-11-07 – 2017-11-10 (×4): 300 mg via ORAL
  Filled 2017-11-06 (×4): qty 1

## 2017-11-06 MED ORDER — SODIUM CHLORIDE 0.9 % IV SOLN
INTRAVENOUS | Status: DC
  Administered 2017-11-06 – 2017-11-08 (×7): via INTRAVENOUS

## 2017-11-06 NOTE — Progress Notes (Signed)
   Subjective: 2 Days Post-Op Procedure(s) (LRB): TOTAL KNEE ARTHROPLASTY (Left) CYSTOSCOPY (N/A) URETERAL DILITATION (N/A) Patient reports pain as 0 out of 10.  Patient noted to be tachycardic up to 126 resting.  He denies any chest pain, shortness of breath, abdominal pain, cough, lower extremity pain or swelling.  He also denies any chest palpitations, dizziness or lightheadedness. We will continue therapy today.  Plan is to go Home after hospital stay.  Objective: Vital signs in last 24 hours: Temp:  [97.6 F (36.4 C)-99.4 F (37.4 C)] 97.6 F (36.4 C) (02/07 0851) Pulse Rate:  [110-122] 118 (02/07 0851) Resp:  [16-20] 20 (02/07 0851) BP: (132-149)/(69-76) 140/69 (02/07 0851) SpO2:  [92 %-97 %] 97 % (02/07 0851)  Intake/Output from previous day: 02/06 0701 - 02/07 0700 In: 360 [P.O.:360] Out: 5300 [Urine:5300] Intake/Output this shift: No intake/output data recorded.  Recent Labs    11/04/17 1512 11/05/17 0336 11/06/17 0502  HGB 13.1 12.3* 11.9*   Recent Labs    11/05/17 0336 11/06/17 0502  WBC 12.7* 10.9*  RBC 4.13* 3.96*  HCT 36.1* 34.8*  PLT 259 230   Recent Labs    11/05/17 0336 11/06/17 0502  NA 131* 133*  K 4.4 4.3  CL 97* 97*  CO2 25 27  BUN 10 13  CREATININE 0.99 0.97  GLUCOSE 144* 143*  CALCIUM 8.5* 8.6*   No results for input(s): LABPT, INR in the last 72 hours.  EXAM General - Patient is Alert, Appropriate and Oriented  Lungs-clear to auscultation with no wheezing rales or rhonchi Cardio- patient tachycardic, slightly irregular Extremity - Neurovascular intact Sensation intact distally Intact pulses distally Dorsiflexion/Plantar flexion intact No cellulitis present Compartment soft  bilateral lower extremity soft, nontender to compression. Dressing - dressing C/D/I and no drainage, dressing changed to honeycomb.  Incision site appears well.  No signs of any infection. Motor Function - intact, moving foot and toes well on exam.    Past Medical History:  Diagnosis Date  . Anxiety   . Cancer [facility])    prostate 2011  . GERD (gastroesophageal reflux disease)   . Headache    migraines    Assessment/Plan:   2 Days Post-Op Procedure(s) (LRB): TOTAL KNEE ARTHROPLASTY (Left) CYSTOSCOPY (N/A) URETERAL DILITATION (N/A) Active Problems:   Primary localized osteoarthritis of left knee   Tachycardia   Acute post op blood loss anemia   Estimated body mass index is 33.67 kg/m as calculated from the following:   Height as of this encounter: 5\' 7"  (1.702 m).   Weight as of this encounter: 97.5 kg (215 lb). Advance diet Up with therapy  Acute post op blood loss anemia -hemoglobin stable at 11.9.  We will recheck labs in am  Continue with Foley catheter, follow up with Urology 7 days postop for foley removal  Tachycardia -tachycardia up to 126.  Patient denies any chest pain, shortness of breath, palpitations, dizziness or lightheadedness.  Will order urinalysis, EKG and give a 500 mm bolus of normal saline.  Patient appears to be in a fluid deficit.  Internal medicine consult ordered and discussed with physician.   DVT Prophylaxis - Lovenox, Foot Pumps and TED hose Weight-Bearing as tolerated to left leg   T. Rachelle Hora, PA-C [place] 11/06/2017, 9:18 AM

## 2017-11-06 NOTE — Progress Notes (Addendum)
Called by nursing the patient's troponin was positive at 0.2.  He just had a negative troponin earlier this afternoon.  Patient is postop from knee replacement and came in today tachycardic.  EKG was ordered this morning but never done or followed up on.  Repeat EKG tonight shows S1 T3.  There is no Q wave in lead III, but given his other panoply of symptoms and a well score of 6 there is high probability of PE and therefore CTA chest is being ordered stat at this time.  Update:  CTA chest positive for PE with some heart strain, heparin gtt started. Echocardiogram ordered  Jacqulyn Bath [facility] 11/06/2017, 11:07 PM

## 2017-11-06 NOTE — Progress Notes (Signed)
Physical Therapy Treatment Patient Details Name: Johnny Freeman MRN: [id number] DOB: [birth year] Today's Date: 11/06/2017    History of Present Illness Pt. is a 66 y.o. male who was admitted to [facility] for a Left TKR. Pt. PMHx includes: Anxiety, CA, GERD, Headache    PT Comments    Pt noted to be tachycardic this am.  No chest pain or SOB noted.  Session limited to exercises and light stretching only.  HR monitored during session 110-120's.  Gait deferred this am.  Will continue as appropriate.  Discussed with PA.  EKG ordered.     Follow Up Recommendations  Home health PT     Equipment Recommendations  Rolling walker with 5" wheels;3in1 (PT)    Recommendations for Other Services       Precautions / Restrictions Precautions Precautions: Fall;Knee Precaution Booklet Issued: No Restrictions Weight Bearing Restrictions: Yes LLE Weight Bearing: Weight bearing as tolerated    Mobility  Bed Mobility Overal bed mobility: Needs Assistance Bed Mobility: Supine to Sit;Sit to Supine     Supine to sit: Min guard Sit to supine: Min guard   General bed mobility comments: needs cues for sequencing. Safe technique performed  Transfers                 General transfer comment: deferred this am due to heartrate  Ambulation/Gait             General Gait Details: reported he walked to bathroom with nursing this am   Stairs            Wheelchair Mobility    Modified Rankin (Stroke Patients Only)       Balance Overall balance assessment: Needs assistance Sitting-balance support: Feet supported Sitting balance-Leahy Scale: Good                                      Cognition Arousal/Alertness: Awake/alert Behavior During Therapy: WFL for tasks assessed/performed Overall Cognitive Status: Within Functional Limits for tasks assessed                                        Exercises Total Joint Exercises Ankle Circles/Pumps:  AROM;Both;10 reps;Supine Quad Sets: 10 reps;AROM;Supine;Both Short Arc Quad: 10 reps;AROM;Supine;Both Heel Slides: AAROM;Supine;Left;10 reps Straight Leg Raises: 10 reps;AAROM;Supine;Left Long Arc Quad: 10 reps;AAROM;Seated;Left Knee Flexion: AAROM;Left;Seated;5 reps Goniometric ROM: 4-71    General Comments        Pertinent Vitals/Pain Pain Assessment: 0-10 Pain Score: 2  Pain Location: Left Knee Pain Descriptors / Indicators: Aching Pain Intervention(s): Limited activity within patient's tolerance    Home Living                      Prior Function            PT Goals (current goals can now be found in the care plan section) Progress towards PT goals: Progressing toward goals    Frequency    BID      PT Plan Current plan remains appropriate    Co-evaluation              AM-PAC PT "6 Clicks" Daily Activity  Outcome Measure  Difficulty turning over in bed (including adjusting bedclothes, sheets and blankets)?: A Little Difficulty moving from lying on back to sitting on the  side of the bed? : A Little Difficulty sitting down on and standing up from a chair with arms (e.g., wheelchair, bedside commode, etc,.)?: Unable Help needed moving to and from a bed to chair (including a wheelchair)?: A Little Help needed walking in hospital room?: A Little Help needed climbing 3-5 steps with a railing? : A Lot 6 Click Score: 15    End of Session Equipment Utilized During Treatment: Gait belt Activity Tolerance: Treatment limited secondary to medical complications (Comment) Patient left: in bed;with bed alarm set;with call bell/phone within reach;with family/visitor present Nurse Communication: Other (comment) Pain - Right/Left: Left Pain - part of body: Knee     Time: 6761-9509 PT Time Calculation (min) (ACUTE ONLY): 27 min  Charges:  $Therapeutic Exercise: 8-22 mins $Therapeutic Activity: 8-22 mins                    G Codes:       Chesley Noon, PTA 11/06/17, 10:23 AM

## 2017-11-06 NOTE — Progress Notes (Signed)
Dr. Posey Pronto on call for Dr Rudene Christians tonight was paged.. And informed him that the patient has an elevated troponin 0.21. hospitalist  aware and treating patient accordingly. Dr Posey Pronto acknowledged and stated he would inform Dr Rudene Christians.

## 2017-11-06 NOTE — Progress Notes (Addendum)
Physical Therapy Treatment Patient Details Name: Johnny Freeman MRN: [id number] DOB: Jul 04, 1951 Today's Date: 11/06/2017    History of Present Illness Pt. is a 67 y.o. male who was admitted to [facility] for a Left TKR. Pt. PMHx includes: Anxiety, CA, GERD, Headache    PT Comments    Per nursing okay to see pt. Pt agreeable to PT; reports 0 pain left knee at rest; 6/10 with weight bearing. Pt HR at rest 125 bpm; pt eager to change position/sit in chair. Monitored HR throughout session with elevation initially to 135 with supine to sit. During seated edge of bed activity of gentle stretching decreases to 126-128 bpm. Minimal ambulation bed to chair for positional change with HR elevating to 139 bpm and lowering to 128-130 with seated rest. Pt asymptomatic. OT in room at session's end to begin treatment; OT updated and will monitor pt HR as well for appropriateness of continued up in chair. Continue PT to progress endurance, L knee range and strength as able.   Addendum: Post OT treatment, OT notes pt HR while in chair 125 during OT treatment.   Follow Up Recommendations  Home health PT     Equipment Recommendations  Rolling walker with 5" wheels;3in1 (PT)    Recommendations for Other Services       Precautions / Restrictions Precautions Precautions: Fall;Knee Restrictions Weight Bearing Restrictions: Yes LLE Weight Bearing: Weight bearing as tolerated    Mobility  Bed Mobility Overal bed mobility: Needs Assistance Bed Mobility: Supine to Sit     Supine to sit: Min assist     General bed mobility comments: cues for sequence; encouraged use of mild bridge to edge of bed and more log roll technique versus trapeze use  Transfers Overall transfer level: Needs assistance Equipment used: Rolling walker (2 wheeled) Transfers: Sit to/from Stand Sit to Stand: Min assist;From elevated surface         General transfer comment: used elevated surface to decrease workload and allow for  pt to get up to chair, monitoring HR throughout  Ambulation/Gait Ambulation/Gait assistance: Min guard Ambulation Distance (Feet): 2 Feet Assistive device: Rolling walker (2 wheeled) Gait Pattern/deviations: Step-to pattern   Gait velocity interpretation: <1.8 ft/sec, indicative of risk for recurrent falls General Gait Details: short bed to chair only due to elevated HR; does elevate to 139 with walk; decreases to 128- 130 with rest; OT in with pt and will continue to monitor HR for appropriateness of remaining up in chair   Stairs            Wheelchair Mobility    Modified Rankin (Stroke Patients Only)       Balance Overall balance assessment: Needs assistance Sitting-balance support: Feet supported;Bilateral upper extremity supported Sitting balance-Leahy Scale: Good     Standing balance support: Bilateral upper extremity supported Standing balance-Leahy Scale: Fair                              Cognition Arousal/Alertness: Awake/alert Behavior During Therapy: WFL for tasks assessed/performed Overall Cognitive Status: Within Functional Limits for tasks assessed                                        Exercises Total Joint Exercises Knee Flexion: AROM;Left;10 reps;Seated(3 positions each rep of gentle stretch )    General Comments  Pertinent Vitals/Pain Pain Assessment: 0-10 Pain Score: 6  Pain Location: Left Knee Pain Intervention(s): Monitored during session;Ice applied    Home Living                      Prior Function            PT Goals (current goals can now be found in the care plan section) Progress towards PT goals: Progressing toward goals(slowly; limited due to elevated HR)    Frequency    BID      PT Plan Current plan remains appropriate    Co-evaluation              AM-PAC PT "6 Clicks" Daily Activity  Outcome Measure  Difficulty turning over in bed (including adjusting  bedclothes, sheets and blankets)?: Unable Difficulty moving from lying on back to sitting on the side of the bed? : Unable Difficulty sitting down on and standing up from a chair with arms (e.g., wheelchair, bedside commode, etc,.)?: Unable Help needed moving to and from a bed to chair (including a wheelchair)?: A Little Help needed walking in hospital room?: A Little Help needed climbing 3-5 steps with a railing? : A Lot 6 Click Score: 11    End of Session Equipment Utilized During Treatment: Gait belt Activity Tolerance: Treatment limited secondary to medical complications (Comment);Other (comment)(elevated HR monitored throughout; deferred amb/heavy ex) Patient left: in chair;with call bell/phone within reach;with chair alarm set;with family/visitor present;Other (comment)(OT in room)   PT Visit Diagnosis: Muscle weakness (generalized) (M62.81);Difficulty in walking, not elsewhere classified (R26.2);Pain Pain - Right/Left: Left Pain - part of body: Knee     Time: 3536-1443 PT Time Calculation (min) (ACUTE ONLY): 21 min  Charges:  $Therapeutic Activity: 8-22 mins                    G CodesLarae Grooms, PTA 11/06/2017, 3:46 PM

## 2017-11-06 NOTE — H&P (Signed)
[facility] [place] NAME: Johnny Freeman    MR#:  [id number]  DATE OF BIRTH:  Aug 22, 1951  DATE OF ADMISSION:  11/04/2017  PRIMARY CARE PHYSICIAN: Baxter Hire, MD   REQUESTING/REFERRING PHYSICIAN:   CHIEF COMPLAINT:  No chief complaint on file.   HISTORY OF PRESENT ILLNESS: Johnny Freeman  is a 67 y.o. male with a known history 67 year old Caucasian male with history of GERD, obesity, hypertension, hyperlipidemia, anxiety, migraine headaches, status post left total knee replacement-postoperative day 2, hospitalist service consulted for tachycardia, patient on interview only complains of knee pain made worse with ambulation/standing/movement, wife at the bedside, blood work noted for sodium 133, chloride 97, glucose 143, UA negative, denies chest pain/shortness of breath, incentive spirometer almost 2 L, patient was not allowed to participate in physical therapy given tachycardia-patient denied any lightheadedness/dizziness/near syncope, patient is now been admitted for asymptomatic tachycardia.  PAST MEDICAL HISTORY:   Past Medical History:  Diagnosis Date  . Anxiety   . [facility])    prostate 2011  . GERD (gastroesophageal reflux disease)   . Headache    migraines    PAST SURGICAL HISTORY:  Past Surgical History:  Procedure Laterality Date  . CYSTOSCOPY N/A 11/04/2017   Procedure: CYSTOSCOPY;  Surgeon: Hessie Knows, MD;  Location: [facility] ORS;  Service: Orthopedics;  Laterality: N/A;  . ROTATOR CUFF REPAIR Right 2003  . TOTAL KNEE ARTHROPLASTY Left 11/04/2017   Procedure: TOTAL KNEE ARTHROPLASTY;  Surgeon: Hessie Knows, MD;  Location: [facility] ORS;  Service: Orthopedics;  Laterality: Left;    SOCIAL HISTORY:  Social History   Tobacco Use  . Smoking status: Former Smoker    Last attempt to quit: 10/03/1972    Years since quitting: 45.1  . Smokeless tobacco: Never Used  Substance Use Topics  . Alcohol use: No    Alcohol/week: 0.0 oz     Frequency: Never    FAMILY HISTORY: History reviewed. No pertinent family history.  DRUG ALLERGIES:  Allergies  Allergen Reactions  . Shellfish Allergy Swelling    Swelling in eyes    REVIEW OF SYSTEMS:   CONSTITUTIONAL: No fever, fatigue or weakness.  EYES: No blurred or double vision.  EARS, NOSE, AND THROAT: No tinnitus or ear pain.  RESPIRATORY: No cough, shortness of breath, wheezing or hemoptysis.  CARDIOVASCULAR: No chest pain, orthopnea, edema.  GASTROINTESTINAL: No nausea, vomiting, diarrhea or abdominal pain.  GENITOURINARY: No dysuria, hematuria.  ENDOCRINE: No polyuria, nocturia,  HEMATOLOGY: No anemia, easy bruising or bleeding SKIN: No rash or lesion. MUSCULOSKELETAL: No joint pain or arthritis.   NEUROLOGIC: No tingling, numbness, weakness.  PSYCHIATRY: No anxiety or depression.   MEDICATIONS AT HOME:  Prior to Admission medications   Medication Sig Start Date End Date Taking? Authorizing Provider  acetaminophen (TYLENOL) 500 MG tablet Take 500 mg by mouth 2 (two) times daily as needed (FOR PAIN.).   Yes [provider]  amLODipine-benazepril (LOTREL) 5-20 MG capsule Take 1 capsule by mouth daily.  09/07/15  Yes [provider]  Ascorbic Acid (VITAMIN C PO) Take 1 tablet by mouth daily.   Yes [provider]  aspirin EC 81 MG tablet Take 162 mg by mouth daily.    Yes [provider]  aspirin-acetaminophen-caffeine (EXCEDRIN MIGRAINE) 510 298 7871 MG tablet Take 2 tablets by mouth every 6 (six) hours as needed for migraine.   Yes [provider]  Cholecalciferol (VITAMIN D-3 PO) Take 1 tablet by mouth daily with  lunch.   Yes [provider]  docusate sodium (COLACE) 100 MG capsule Take 100 mg by mouth daily.   Yes [provider]  Multiple Vitamin (MULTIVITAMIN WITH MINERALS) TABS tablet Take 1 tablet by mouth daily.   Yes [provider]  naproxen (NAPROSYN) 500 MG tablet Take 500 mg by mouth 2  (two) times daily as needed. For knee pain. 09/29/17  Yes [provider]  Omega-3 Fatty Acids (FISH OIL PO) Take 2 capsules by mouth daily with lunch.   Yes [provider]  omeprazole (PRILOSEC) 20 MG capsule Take 20 mg by mouth daily before supper. Take 1 capsule (20 mg) by mouth 30 minutes before supper 09/07/15  Yes [provider]  amoxicillin (AMOXIL) 875 MG tablet Take 1 tablet (875 mg total) by mouth 2 (two) times daily. Patient not taking: Reported on 10/14/2017 07/21/17   Sable Feil, PA-C  promethazine-dextromethorphan (PROMETHAZINE-DM) 6.25-15 MG/5ML syrup Take 5 mLs by mouth 4 (four) times daily as needed for cough. Patient not taking: Reported on 10/14/2017 07/21/17   Sable Feil, PA-C      PHYSICAL EXAMINATION:   VITAL SIGNS: Blood pressure (!) 149/72, pulse (!) 121, temperature 99.4 F (37.4 C), temperature source Oral, resp. rate 20, height 5\' 7"  (1.702 m), weight 97.5 kg (215 lb), SpO2 94 %.  GENERAL:  67 y.o.-year-old patient lying in the bed with no acute distress.  Morbid obesity EYES: Pupils equal, round, reactive to light and accommodation. No scleral icterus. Extraocular muscles intact.  HEENT: Head atraumatic, normocephalic. Oropharynx and nasopharynx clear.  NECK:  Supple, no jugular venous distention. No thyroid enlargement, no tenderness.  LUNGS: Normal breath sounds bilaterally, no wheezing, rales,rhonchi or crepitation. No use of accessory muscles of respiration.  CARDIOVASCULAR: S1, S2 normal. No murmurs, rubs, or gallops.  ABDOMEN: Soft, nontender, nondistended. Bowel sounds present. No organomegaly or mass.  EXTREMITIES: No pedal edema, cyanosis, or clubbing.  NEUROLOGIC: Cranial nerves II through XII are intact. MAES -decreased range of motion of left knee gait not checked.  PSYCHIATRIC: The patient is alert and oriented x 3.  SKIN: No obvious rash, lesion, or ulcer.   LABORATORY PANEL:   CBC Recent Labs  Lab  11/04/17 1512 11/05/17 0336 11/06/17 0502  WBC 11.6* 12.7* 10.9*  HGB 13.1 12.3* 11.9*  HCT 38.9* 36.1* 34.8*  PLT 244 259 230  MCV 87.8 87.4 87.7  MCH 29.6 29.7 30.0  MCHC 33.7 34.0 34.2  RDW 13.4 13.2 13.6   ------------------------------------------------------------------------------------------------------------------  Chemistries  Recent Labs  Lab 11/04/17 1512 11/05/17 0336 11/06/17 0502  NA  --  131* 133*  K  --  4.4 4.3  CL  --  97* 97*  CO2  --  25 27  GLUCOSE  --  144* 143*  BUN  --  10 13  CREATININE 0.93 0.99 0.97  CALCIUM  --  8.5* 8.6*   ------------------------------------------------------------------------------------------------------------------ estimated creatinine clearance is 81.1 mL/min (by C-G formula based on SCr of 0.97 mg/dL). ------------------------------------------------------------------------------------------------------------------ No results for input(s): TSH, T4TOTAL, T3FREE, THYROIDAB in the last 72 hours.  Invalid input(s): FREET3   Coagulation profile No results for input(s): INR, PROTIME in the last 168 hours. ------------------------------------------------------------------------------------------------------------------- No results for input(s): DDIMER in the last 72 hours. -------------------------------------------------------------------------------------------------------------------  Cardiac Enzymes No results for input(s): CKMB, TROPONINI, MYOGLOBIN in the last 168 hours.  Invalid input(s): CK ------------------------------------------------------------------------------------------------------------------ Invalid input(s): POCBNP  ---------------------------------------------------------------------------------------------------------------  Urinalysis    Component Value Date/Time   COLORURINE YELLOW (A) 11/06/2017  [place] (A) 11/06/2017 1014   APPEARANCEUR Clear 07/08/2016 0815   LABSPEC  1.019 11/06/2017 1014   PHURINE 6.0 11/06/2017 1014   GLUCOSEU NEGATIVE 11/06/2017 1014   HGBUR SMALL (A) 11/06/2017 1014   BILIRUBINUR NEGATIVE 11/06/2017 1014   BILIRUBINUR Negative 07/08/2016 0815   KETONESUR NEGATIVE 11/06/2017 1014   PROTEINUR 30 (A) 11/06/2017 1014   NITRITE NEGATIVE 11/06/2017 1014   LEUKOCYTESUR NEGATIVE 11/06/2017 1014   LEUKOCYTESUR Negative 07/08/2016 0815     RADIOLOGY: No results found.  EKG: Orders placed or performed during the hospital encounter of 11/04/17  . EKG 12-Lead  . EKG 12-Lead    IMPRESSION AND PLAN: 1 Acute asymptomatic sinus tachycardia Etiology unknown Possible etiologies include acute dehydration-urine appears amber in color, pain induced secondary to recent left knee replacement, acute coronary syndrome/PE thought to be less likely given absence of chest pain/shortness of breath/tachypnea We will do IV fluids for rehydration over the next 12-16 hours, check TSH, check orthostatics, hold current antihypertensives, start Cardizem CD, check blood cultures, echo, vitals per routine, make changes as per necessary EKG noted for sinus tachycardia with heart rate of 118 Consider CT of the chest to evaluate for possible pulmonary embolism if no improvement by tomorrow -though diagnosis is doubted at this time   2 acute hyponatremia/hypochloremia Replete with IV fluids and check BMP in the morning  3 chronic benign hypertension Home antihypertensive regimen's on hold, started on Cardizem CD as stated above, vitals per routine, make changes as per necessary  4 postop day 2 left total knee replacement Stable Postop care/pain control per primary service  5 chronic GERD without esophagitis PPI daily  6 chronic morbid obesity Most likely secondary to excess calories Lifestyle modification recommended  7 chronic GAD, unspecified Appears stable Klonopin as needed  Full code Condition stable Prognosis good Disposition Home in 1-2  days barring any complications  All the records are reviewed and case discussed with ED provider. Management plans discussed with the patient, family and they are in agreement.  CODE STATUS:    Code Status Orders  (From admission, onward)        Start     Ordered   11/04/17 1450  Full code  Continuous     11/04/17 1449    Code Status History    Date Active Date Inactive Code Status Order ID Comments User Context   This patient has a current code status but no historical code status.       TOTAL TIME TAKING CARE OF THIS PATIENT: 45 minutes.    Avel Peace Aubryanna Nesheim M.D on 11/06/2017   Between 7am to 6pm - Pager - [phone]  After 6pm go to [email or web address] - password [email or web address] [facility]  Office  [phone]  CC: Primary care physician; Baxter Hire, MD   Note: This dictation was prepared with Dragon dictation along with smaller phrase technology. Any transcriptional errors that result from this process are unintentional.

## 2017-11-06 NOTE — Progress Notes (Signed)
Lab called with critical lab of 0.21. Dr. Jannifer Franklin notified. Order for STAT EKG and to place Pt on cardiac monitor obtained.

## 2017-11-07 ENCOUNTER — Encounter: Payer: Self-pay | Admitting: Orthopedic Surgery

## 2017-11-07 ENCOUNTER — Inpatient Hospital Stay (HOSPITAL_COMMUNITY)
Admission: RE | Admit: 2017-11-07 | Discharge: 2017-11-07 | Disposition: A | Discharge status: Home / Self Care | Payer: Managed Care, Other (non HMO) | Source: Ambulatory Visit | Attending: Internal Medicine | Admitting: Internal Medicine

## 2017-11-07 DIAGNOSIS — I5030 Unspecified diastolic (congestive) heart failure: Secondary | ICD-10-CM

## 2017-11-07 LAB — APTT: APTT: 37 s — AB (ref 24–36)

## 2017-11-07 LAB — TROPONIN I
Troponin I: <0.03 ng/mL (ref <0.03)
Troponin I: <0.03 ng/mL (ref <0.03)

## 2017-11-07 LAB — ECHOCARDIOGRAM COMPLETE
Height: 67 in
Weight: 3440 oz

## 2017-11-07 LAB — PROTIME-INR
INR: 1.1
Prothrombin Time: 14.1 seconds (ref 11.4–15.2)

## 2017-11-07 MED ORDER — HEPARIN BOLUS VIA INFUSION
5000.0000 [IU] | Freq: Once | INTRAVENOUS | Status: DC
  Filled 2017-11-07: qty 5000

## 2017-11-07 MED ORDER — HEPARIN (PORCINE) IN NACL 100-0.45 UNIT/ML-% IJ SOLN
1500.0000 [IU]/h | INTRAMUSCULAR | Status: DC
  Administered 2017-11-07: 1500 [IU]/h via INTRAVENOUS
  Filled 2017-11-07: qty 250

## 2017-11-07 MED ORDER — APIXABAN 5 MG PO TABS
10.0000 mg | ORAL_TABLET | Freq: Two times a day (BID) | ORAL | Status: DC
  Administered 2017-11-07 – 2017-11-10 (×7): 10 mg via ORAL
  Filled 2017-11-07 (×7): qty 2

## 2017-11-07 MED ORDER — APIXABAN 5 MG PO TABS: 5.0000 mg | ORAL_TABLET | Freq: Two times a day (BID) | ORAL | Status: DC

## 2017-11-07 MED ORDER — HEPARIN BOLUS VIA INFUSION
2500.0000 [IU] | Freq: Once | INTRAVENOUS | Status: AC
  Administered 2017-11-07: 2500 [IU] via INTRAVENOUS
  Filled 2017-11-07: qty 2500

## 2017-11-07 NOTE — Progress Notes (Signed)
[facility] NAME: Johnny Freeman    MR#:  [id number]  DATE OF BIRTH:  1951-02-26  SUBJECTIVE:   Patient without complaints today  Has not had bowel movement. No shortness of breath or chest pain. Pain from surgery is controlled  REVIEW OF SYSTEMS:    Review of Systems  Constitutional: Negative for fever, chills weight loss HENT: Negative for ear pain, nosebleeds, congestion, facial swelling, rhinorrhea, neck pain, neck stiffness and ear discharge.   Respiratory: Negative for cough, shortness of breath, wheezing  Cardiovascular: Negative for chest pain, palpitations and leg swelling.  Gastrointestinal: Negative for heartburn, abdominal pain, vomiting, diarrhea  positive consitpation Genitourinary: Negative for dysuria, urgency, frequency, hematuria Musculoskeletal: Negative for back pain or joint pain Neurological: Negative for dizziness, seizures, syncope, focal weakness,  numbness and headaches.  Hematological: Does not bruise/bleed easily.  Psychiatric/Behavioral: Negative for hallucinations, confusion, dysphoric mood    Tolerating Diet: yes      DRUG ALLERGIES:   Allergies  Allergen Reactions  . Shellfish Allergy Swelling    Swelling in eyes    VITALS:  Blood pressure 138/73, pulse (!) 125, temperature 99.8 F (37.7 C), resp. rate 18, height 5\' 7"  (1.702 m), weight 97.5 kg (215 lb), SpO2 94 %.  PHYSICAL EXAMINATION:  Constitutional: Appears well-developed and well-nourished. No distress. HENT: Normocephalic. Marland Kitchen Oropharynx is clear and moist.  Eyes: Conjunctivae and EOM are normal. PERRLA, no scleral icterus.  Neck: Normal ROM. Neck supple. No JVD. No tracheal deviation. CVS: RRR, S1/S2 +, no murmurs, no gallops, no carotid bruit.  Pulmonary: Effort and breath sounds normal, no stridor, rhonchi, wheezes, rales.  Abdominal: Soft. BS +,  no distension, tenderness, rebound or guarding.  Musculoskeletal: Has  dressing left knee no drainage  Neuro: Alert. CN 2-12 grossly intact. No focal deficits. Skin: Skin is warm and dry. No rash noted. Psychiatric: Normal mood and affect.      LABORATORY PANEL:   CBC Recent Labs  Lab 11/06/17 0502  WBC 10.9*  HGB 11.9*  HCT 34.8*  PLT 230   ------------------------------------------------------------------------------------------------------------------  Chemistries  Recent Labs  Lab 11/06/17 0502  NA 133*  K 4.3  CL 97*  CO2 27  GLUCOSE 143*  BUN 13  CREATININE 0.97  CALCIUM 8.6*   ------------------------------------------------------------------------------------------------------------------  Cardiac Enzymes Recent Labs  Lab 11/06/17 1532 11/06/17 2118 11/07/17 0313  TROPONINI <0.03 0.21* <0.03   ------------------------------------------------------------------------------------------------------------------  RADIOLOGY:  Ct Angio Chest Pe W Or Wo Contrast  Result Date: 11/07/2017 CLINICAL DATA:  Postop knee replacement with tachycardia EXAM: CT ANGIOGRAPHY CHEST WITH CONTRAST TECHNIQUE: Multidetector CT imaging of the chest was performed using the standard protocol during bolus administration of intravenous contrast. Multiplanar CT image reconstructions and MIPs were obtained to evaluate the vascular anatomy. CONTRAST:  34mL ISOVUE-370 IOPAMIDOL (ISOVUE-370) INJECTION 76% COMPARISON:  Chest x-ray 08/31/2013 FINDINGS: Cardiovascular: Satisfactory opacification of the pulmonary arteries to the segmental level. Respiratory motion artifact limits the study. Multiple filling defects visualized within segmental and subsegmental right upper lobe pulmonary artery branch vessels with additional small emboli present in right lower lobe subsegmental branch vessels. Nonocclusive thrombus in distal left lower lobe pulmonary artery with multiple filling defects in segmental and subsegmental branches of the left lower lobe. Suspected small emboli  within subsegmental branches of the left upper lobe. RV LV ratio is elevated at 1.46. Nonaneurysmal aorta. Mild aortic atherosclerosis. Coronary vascular calcification. Normal heart size. No pericardial effusion Mediastinum/Nodes: Midline trachea. No thyroid mass.  No significantly enlarged mediastinal lymph nodes. Esophagus within normal limits. Lungs/Pleura: Lungs are clear. No pleural effusion or pneumothorax. Upper Abdomen: Cysts within the liver. No acute abnormality. Calcified gallstones. Musculoskeletal: No chest wall abnormality. No acute or significant osseous findings. Review of the MIP images confirms the above findings. IMPRESSION: 1. Positive for acute pulmonary emboli within distal left lower lobe pulmonary artery with extension of thrombus into multiple segmental and subsegmental branches of the left lower lobe. Additional emboli visualized within right upper lobe segmental and subsegmental branch vessels with suspected small emboli in right lower lobe and left upper lobe subsegmental branches. Positive for acute PE with CT evidence of right heart strain (RV/LV Ratio = 1.46) consistent with at least submassive (intermediate risk) PE. The presence of right heart strain has been associated with an increased risk of morbidity and mortality. Please activate Code PE by paging [phone]. 2. Gallstones 3. Liver cysts Critical Value/emergent results were called by telephone at the time of interpretation on 11/07/2017 at 12:59 am to Dr. Lance Coon , who verbally acknowledged these results. Aortic Atherosclerosis (ICD10-I70.0). Electronically Signed   By: Donavan Foil M.D.   On: 11/07/2017 00:59     ASSESSMENT AND PLAN:   67 y/o male with history of prostate cancer in remission, postoperative day #3 who developed a symptomatic sinus tachycardia.  1. Pulmonary emboli with CT evidence of right heart strain: Patient can be transitioned from heparin to Eliquis Discussed side effects, alternatives,  risks and benefits with the patient and his wife who agree and would like to start on this medication. Elevation in troponin is due to pulmonary emboli. He has ruled out for ACS. Patient will need outpatient follow-up with oncology. Patient will likely need 3-6 months of oral anticoagulation. Patient will undergo echocardiogram and then may be discharged if that is the plan by orthopedic surgery  2. Constipation: Patient needs aggressive bowel regimen  3. Essential hypertension: Continue diltiazem 4. GERD: Continue PPI  5. Postoperative #3 left total knee replacement: Management as per orthopedic surgery  From a medical standpoint patient may be discharged. Please make sure he is on anticoagulation with Eliquis and he has outpatient follow-up with oncology prior to discharge.  Thank you for allowing [place] to purchase pain in the care of your patient.        Management plans discussed with the patient and he is in agreement.  CODE STATUS: Full  TOTAL TIME TAKING CARE OF THIS PATIENT: 30 minutes.     POSSIBLE D/C today, DEPENDING ON CLINICAL CONDITION.   Teriyah Purington M.D on 11/07/2017 at 7:34 AM  Between 7am to 6pm - Pager - [phone] After 6pm go to [email or web address] - password [email or web address] [facility]  Office  [phone]  CC: Primary care physician; Baxter Hire, MD  Note: This dictation was prepared with Dragon dictation along with smaller phrase technology. Any transcriptional errors that result from this process are unintentional.

## 2017-11-07 NOTE — Progress Notes (Signed)
PT Cancellation Note  Patient Details Name: Johnny Freeman MRN: [id number] DOB: 1950/11/26   Cancelled Treatment:    Reason Eval/Treat Not Completed: Patient not medically ready. Pt dx with PE this date and started on anticoag at 74. Per protocol, will hold therapy for 48 hrs. Will resume after time frame if medically stable.   [name] 11/07/2017, 8:33 AM Greggory Stallion, PT, DPT [phone]

## 2017-11-07 NOTE — Progress Notes (Signed)
   Subjective: 3 Days Post-Op Procedure(s) (LRB): TOTAL KNEE ARTHROPLASTY (Left) CYSTOSCOPY (N/A) URETERAL DILITATION (N/A) Patient reports pain as 0 out of 10.  Diagnosed with PE yesterday, started on Eliquis. He denies any chest pain, shortness of breath, abdominal pain, cough, lower extremity pain or swelling.  He also denies any chest palpitations, dizziness or lightheadedness. We will hold therapy today per PT protocol.  Plan is to go Home after hospital stay.  Objective: Vital signs in last 24 hours: Temp:  [98.4 F (36.9 C)-101 F (38.3 C)] 99.8 F (37.7 C) (02/07 2302) Pulse Rate:  [118-125] 125 (02/07 2027) Resp:  [18-20] 18 (02/07 2027) BP: (138-149)/(72-77) 138/73 (02/07 2027) SpO2:  [91 %-98 %] 94 % (02/07 2027)  Intake/Output from previous day: 02/07 0701 - 02/08 0700 In: 960 [P.O.:960] Out: 2900 [Urine:2900] Intake/Output this shift: No intake/output data recorded.  Recent Labs    11/04/17 1512 11/05/17 0336 11/06/17 0502  HGB 13.1 12.3* 11.9*   Recent Labs    11/05/17 0336 11/06/17 0502  WBC 12.7* 10.9*  RBC 4.13* 3.96*  HCT 36.1* 34.8*  PLT 259 230   Recent Labs    11/05/17 0336 11/06/17 0502  NA 131* 133*  K 4.4 4.3  CL 97* 97*  CO2 25 27  BUN 10 13  CREATININE 0.99 0.97  GLUCOSE 144* 143*  CALCIUM 8.5* 8.6*   Recent Labs    11/07/17 0120  INR 1.10    EXAM General - Patient is Alert, Appropriate and Oriented  Lungs-clear to auscultation with no wheezing rales or rhonchi Cardio- patient tachycardic, HR manually 100 Extremity - Neurovascular intact Sensation intact distally Intact pulses distally Dorsiflexion/Plantar flexion intact No cellulitis present Compartment soft  bilateral lower extremity soft, nontender to compression. Dressing - dressing C/D/I and no drainage, dressing changed to honeycomb.  Incision site appears well.  No signs of any infection. Motor Function - intact, moving foot and toes well on exam.   Past  Medical History:  Diagnosis Date  . Anxiety   . Cancer [facility])    prostate 2011  . GERD (gastroesophageal reflux disease)   . Headache    migraines    Assessment/Plan:   3 Days Post-Op Procedure(s) (LRB): TOTAL KNEE ARTHROPLASTY (Left) CYSTOSCOPY (N/A) URETERAL DILITATION (N/A) Active Problems:   Primary localized osteoarthritis of left knee   Pulmonary Emboli   Acute post op blood loss anemia   Estimated body mass index is 33.67 kg/m as calculated from the following:   Height as of this encounter: 5\' 7"  (1.702 m).   Weight as of this encounter: 97.5 kg (215 lb).  Acute post op blood loss anemia -hemoglobin stable at 11.9.    Continue with Foley catheter, follow up with Urology 7 days postop for foley removal  PE- HR improving. Patient started on Eliquis. Follow up with oncology/hematology next week. Echo pending.  Hold PT for 48 hrs per protocol. Plan on discharge Sunday or Monday pending progress with PT.  Appreciate medicine Input  DVT Prophylaxis - Foot Pumps, TED hose and Eliquis 5mg  BID Weight-Bearing as tolerated to left leg   T. Rachelle Hora, PA-C [place] 11/07/2017, 8:57 AM

## 2017-11-07 NOTE — Progress Notes (Signed)
*  PRELIMINARY RESULTS* Echocardiogram 2D Echocardiogram has been performed.  Johnny Freeman Johnny Freeman Johnny Freeman 11/07/2017, 9:33 AM

## 2017-11-07 NOTE — Progress Notes (Addendum)
ANTICOAGULATION CONSULT NOTE - Initial Consult  Pharmacy Consult for heparin drip/apixaban dosing Indication: pulmonary embolus  Allergies  Allergen Reactions  . Shellfish Allergy Swelling    Swelling in eyes    Patient Measurements: Height: 5\' 7"  (170.2 cm) Weight: 215 lb (97.5 kg) IBW/kg (Calculated) : 66.1 Heparin Dosing Weight: 87 kg  Vital Signs: Temp: 99.8 F (37.7 C) (02/07 2302) Temp Source: Oral (02/07 2027) BP: 138/73 (02/07 2027) Pulse Rate: 125 (02/07 2027)  Labs: Recent Labs    11/04/17 1512 11/05/17 0336 11/06/17 0502 11/06/17 1532 11/06/17 2118 11/07/17 0120  HGB 13.1 12.3* 11.9*  --   --   --   HCT 38.9* 36.1* 34.8*  --   --   --   PLT 244 259 230  --   --   --   APTT  --   --   --   --   --  37*  LABPROT  --   --   --   --   --  14.1  INR  --   --   --   --   --  1.10  CREATININE 0.93 0.99 0.97  --   --   --   TROPONINI  --   --   --  <0.03 0.21*  --     Estimated Creatinine Clearance: 81.1 mL/min (by C-G formula based on SCr of 0.97 mg/dL).   Medical History: Past Medical History:  Diagnosis Date  . Anxiety   . Cancer [facility])    prostate 2011  . GERD (gastroesophageal reflux disease)   . Headache    migraines    Medications:  No anticoagulation in PTA meds  Assessment:  Goal of Therapy:  Heparin level 0.3-0.7 units/ml Monitor platelets by anticoagulation protocol: Yes   Plan:  Will give half bolus of 2500 units. Initial rate of 1300 units/hr. First heparin level 6 hours after start of infusion.  Changed to apixaban 10 mg BID x 7 days followed by 5 mg BID.   Anneli Bing S 11/07/2017,2:45 AM

## 2017-11-08 MED ORDER — ALUM & MAG HYDROXIDE-SIMETH 200-200-20 MG/5ML PO SUSP
30.0000 mL | Freq: Four times a day (QID) | ORAL | Status: DC | PRN
  Administered 2017-11-08: 30 mL via ORAL
  Filled 2017-11-08: qty 30

## 2017-11-08 NOTE — Progress Notes (Signed)
Pt is alert and oriented. Surgical dressing dry and intact. No complaints of shortness of breath or chest pain. Iv infusing without difficulty. Foley patent and draining urine. Medicated for pain through the night.

## 2017-11-08 NOTE — Progress Notes (Signed)
PT Cancellation Note  Patient Details Name: Johnny Freeman MRN: [id number] DOB: [birth year]   Cancelled Treatment:    Reason Eval/Treat Not Completed: Patient not medically ready.  Pt diagnosed with PE and started on anticoagulation at 0235 11/07/17. Per PT protocol, will hold therapy for 48 hrs. Will resume therapy tomorrow (after 48 hours) if medically stable.  Leitha Bleak, PT 11/08/17, 4:58 PM [phone]

## 2017-11-08 NOTE — Progress Notes (Signed)
OT Cancellation Note  Patient Details Name: Johnny Freeman MRN: [id number] DOB: 03-07-1951   Cancelled Treatment:    Reason Eval/Treat Not Completed: Medical issues which prohibited therapy  Pt  Was diagnosed with PE Thursday, started on Eliquis. We will  therapy today per protocol.       Rosalyn Gess OTR/L,CLT 11/08/2017, 12:58 PM

## 2017-11-08 NOTE — Progress Notes (Signed)
   Subjective: 4 Days Post-Op Procedure(s) (LRB): TOTAL KNEE ARTHROPLASTY (Left) CYSTOSCOPY (N/A) URETERAL DILITATION (N/A) Patient reports pain as 0 out of 10.  Diagnosed with PE Thursday, started on Eliquis. He denies any chest pain, shortness of breath, abdominal pain, cough, lower extremity pain or swelling.  He also denies any chest palpitations, dizziness or lightheadedness. We will hold therapy today per PT protocol.  Plan is to go Home after hospital stay.  Objective: Vital signs in last 24 hours: Temp:  [98.9 F (37.2 C)-100.5 F (38.1 C)] 98.9 F (37.2 C) (02/09 0443) Pulse Rate:  [101-110] 101 (02/09 0443) Resp:  [16-19] 19 (02/09 0443) BP: (130-144)/(61-82) 130/67 (02/09 0443) SpO2:  [92 %-98 %] 92 % (02/09 0443)  Intake/Output from previous day: 02/08 0701 - 02/09 0700 In: 1840 [P.O.:840; I.V.:1000] Out: 5250 [Urine:5250] Intake/Output this shift: No intake/output data recorded.  Recent Labs    11/06/17 0502  HGB 11.9*   Recent Labs    11/06/17 0502  WBC 10.9*  RBC 3.96*  HCT 34.8*  PLT 230   Recent Labs    11/06/17 0502  NA 133*  K 4.3  CL 97*  CO2 27  BUN 13  CREATININE 0.97  GLUCOSE 143*  CALCIUM 8.6*   Recent Labs    11/07/17 0120  INR 1.10    EXAM General - Patient is Alert, Appropriate and Oriented  Lungs-clear to auscultation with no wheezing rales or rhonchi Cardio- patient tachycardic, HR manually 100 Extremity - Neurovascular intact Sensation intact distally Intact pulses distally Dorsiflexion/Plantar flexion intact No cellulitis present Compartment soft  bilateral lower extremity soft, nontender to compression. Dressing - dressing C/D/I and no drainage, dressing changed to honeycomb.  Incision site appears well.  No signs of any infection. Motor Function - intact, moving foot and toes well on exam.   Past Medical History:  Diagnosis Date  . Anxiety   . Cancer [facility])    prostate 2011  . GERD (gastroesophageal reflux  disease)   . Headache    migraines    Assessment/Plan:   4 Days Post-Op Procedure(s) (LRB): TOTAL KNEE ARTHROPLASTY (Left) CYSTOSCOPY (N/A) URETERAL DILITATION (N/A) Active Problems:   Primary localized osteoarthritis of left knee   Pulmonary Emboli   Acute post op blood loss anemia   Estimated body mass index is 33.67 kg/m as calculated from the following:   Height as of this encounter: 5\' 7"  (1.702 m).   Weight as of this encounter: 97.5 kg (215 lb).  Acute post op blood loss anemia -hemoglobin stable at 11.9.    Continue with Foley catheter, follow up with Urology Tuesday. If the patient is in the hospital, he could possibly have the Foley removed before he leaves to go home.  PE- HR improving. Patient started on Eliquis. Follow up with oncology/hematology next week. Echo pending.  Hold PT for 48 hrs per protocol. Plan on discharge Sunday or Monday pending progress with PT.  Appreciate medicine Input  DVT Prophylaxis - Foot Pumps, TED hose and Eliquis 5mg  BID Weight-Bearing as tolerated to left leg   Reche Dixon, PA-C [place] 11/08/2017, 7:25 AM

## 2017-11-09 MED ORDER — APIXABAN 5 MG PO TABS: 5.0000 mg | ORAL_TABLET | Freq: Two times a day (BID) | ORAL | 0 refills | Status: DC

## 2017-11-09 MED ORDER — OXYCODONE HCL 5 MG PO TABS: 5.0000 mg | ORAL_TABLET | ORAL | 0 refills | Status: DC | PRN

## 2017-11-09 NOTE — Discharge Summary (Signed)
Physician Discharge Summary  Subjective: 5 Days Post-Op Procedure(s) (LRB): TOTAL KNEE ARTHROPLASTY (Left) CYSTOSCOPY (N/A) URETERAL DILITATION (N/A) Patient reports pain as mild.   Patient seen in rounds with Dr. Roland Rack. Patient is well, and has had no acute complaints or problems  Diagnosed with a pulmonary embolus on Thursday. Patient is ready to go home with home health physical therapy  Physician Discharge Summary  Patient ID: Johnny Freeman MRN: [id number] DOB/AGE: 1951-05-16 67 y.o.  Admit date: 11/04/2017 Discharge date: 11/10/2017 Admission Diagnoses:  Discharge Diagnoses:  Active Problems:   Primary localized osteoarthritis of left knee   Discharged Condition: stable  Hospital Course: The patient is postop day 5 from a left total knee replacement. The patient had been doing well with his knee replacement until postop day 2, when he was found to have a pulmonary embolus. The patient was begun on Eliquis and stopped his physical therapy for 48 hours. The patient has been improving in the bed and has now had a bowel movement. The patient will begin physical therapy today for possible discharge home.  Treatments: surgery:  TOTAL KNEE ARTHROPLASTY (Left)  SURGEON: Laurene Footman, MD  ASSISTANTS: Rachelle Hora Cay[facility]  ANESTHESIA:   spinal  EBL:  Total I/O In: -  Out: 25 [Blood:25]  BLOOD ADMINISTERED:none  DRAINS: none   LOCAL MEDICATIONS USED:  MARCAINE    and OTHER Morphine, Exparel  SPECIMEN:  No Specimen  DISPOSITION OF SPECIMEN:  N/A  COUNTS:  YES  TOURNIQUET:  * Missing tourniquet times found for documented tourniquets in log: [id number] *   IMPLANTS:Medacta GMK sphere left 5+ femoral component, 5 tibia baseplate with 10 mm insert and short stem, 1 patella, all components cemented    Discharge Exam: Blood pressure 127/72, pulse (!) 101, temperature 99.2 F (37.3 C), temperature source Oral, resp. rate 19, height 5\' 7"  (1.702 m), weight 97.5  kg (215 lb), SpO2 95 %.   Disposition: Final discharge disposition not confirmed   Allergies as of 11/09/2017      Reactions   Shellfish Allergy Swelling   Swelling in eyes      Medication List    STOP taking these medications   amoxicillin 875 MG tablet Commonly known as:  AMOXIL   aspirin EC 81 MG tablet   naproxen 500 MG tablet Commonly known as:  NAPROSYN   promethazine-dextromethorphan 6.25-15 MG/5ML syrup Commonly known as:  PROMETHAZINE-DM     TAKE these medications   acetaminophen 500 MG tablet Commonly known as:  TYLENOL Take 500 mg by mouth 2 (two) times daily as needed (FOR PAIN.).   amLODipine-benazepril 5-20 MG capsule Commonly known as:  LOTREL Take 1 capsule by mouth daily.   apixaban 5 MG Tabs tablet Commonly known as:  ELIQUIS Take 1 tablet (5 mg total) by mouth 2 (two) times daily. Start taking on:  11/14/2017   aspirin-acetaminophen-caffeine 250-250-65 MG tablet Commonly known as:  EXCEDRIN MIGRAINE Take 2 tablets by mouth every 6 (six) hours as needed for migraine.   docusate sodium 100 MG capsule Commonly known as:  COLACE Take 100 mg by mouth daily.   FISH OIL PO Take 2 capsules by mouth daily with lunch.   multivitamin with minerals Tabs tablet Take 1 tablet by mouth daily.   omeprazole 20 MG capsule Commonly known as:  PRILOSEC Take 20 mg by mouth daily before supper. Take 1 capsule (20 mg) by mouth 30 minutes before supper   oxyCODONE 5 MG immediate release tablet Commonly known  as:  Oxy IR/ROXICODONE Take 1 tablet (5 mg total) by mouth every 4 (four) hours as needed for moderate pain ((score 4 to 6)).   VITAMIN C PO Take 1 tablet by mouth daily.   VITAMIN D-3 PO Take 1 tablet by mouth daily with lunch.            Durable Medical Equipment  (From admission, onward)        Start     Ordered   11/04/17 1450  DME Walker rolling  Once    Question:  Patient needs a walker to treat with the following condition  Answer:   Status post total knee replacement using cement, left   11/04/17 1449   11/04/17 1450  DME 3 n 1  Once     11/04/17 1449   11/04/17 1450  DME Bedside commode  Once    Question:  Patient needs a bedside commode to treat with the following condition  Answer:  Status post total knee replacement using cement, left   11/04/17 1449     Follow-up Information    Lequita Asal, MD Follow up in 3 week(s).   Specialty:  Hematology and Oncology Why:  pe after surgery Contact information: [place] [address] [phone]        Hessie Knows, MD. Go in 2 week(s).   Specialty:  Orthopedic Surgery Contact information: [place] [id number] [phone]           Signed: Prescott Parma, TODD 11/09/2017, 7:15 AM   Objective: Vital signs in last 24 hours: Temp:  [98.3 F (36.8 C)-99.6 F (37.6 C)] 99.2 F (37.3 C) (02/10 0457) Pulse Rate:  [99-101] 101 (02/10 0457) Resp:  [18-19] 19 (02/10 0457) BP: (120-148)/(72-82) 127/72 (02/10 0457) SpO2:  [95 %-96 %] 95 % (02/10 0457)  Intake/Output from previous day:  Intake/Output Summary (Last 24 hours) at 11/09/2017 0715 Last data filed at 11/09/2017 0454 Gross per 24 hour  Intake 4889.17 ml  Output 3475 ml  Net 1414.17 ml    Intake/Output this shift: No intake/output data recorded.  Labs: No results for input(s): HGB in the last 72 hours. No results for input(s): WBC, RBC, HCT, PLT in the last 72 hours. No results for input(s): NA, K, CL, CO2, BUN, CREATININE, GLUCOSE, CALCIUM in the last 72 hours. Recent Labs    11/07/17 0120  INR 1.10    EXAM: General - Patient is Alert and Oriented Extremity - Neurovascular intact Dorsiflexion/Plantar flexion intact No cellulitis present Compartment soft Incision - clean, dry, blood tinged drainage Motor Function -  plantarflexion and dorsiflexion are intact.  Assessment/Plan: 5 Days Post-Op Procedure(s)  (LRB): TOTAL KNEE ARTHROPLASTY (Left) CYSTOSCOPY (N/A) URETERAL DILITATION (N/A) Procedure(s) (LRB): TOTAL KNEE ARTHROPLASTY (Left) CYSTOSCOPY (N/A) URETERAL DILITATION (N/A) Past Medical History:  Diagnosis Date  . Anxiety   . Cancer [facility])    prostate 2011  . GERD (gastroesophageal reflux disease)   . Headache    migraines   Active Problems:   Primary localized osteoarthritis of left knee  Estimated body mass index is 33.67 kg/m as calculated from the following:   Height as of this encounter: 5\' 7"  (1.702 m).   Weight as of this encounter: 97.5 kg (215 lb). Discharge home with home health Diet - Regular diet Follow up - in 2 weeks Activity - WBAT Disposition - Home Condition Upon Discharge - Stable DVT Prophylaxis - Foot Pumps, TED hose and Eliquis  Patient has had a bowel movement.  Reche Dixon, PA-C Orthopaedic Surgery 11/09/2017, 7:15 AM

## 2017-11-09 NOTE — Progress Notes (Signed)
Physical Therapy Treatment Patient Details Name: Johnny Freeman MRN: [id number] DOB: 1951-01-31 Today's Date: 11/09/2017    History of Present Illness Pt is a 67 y.o. male who was admitted to [facility] 11/04/17 for a Left TKR.  Pt noted with sinus tachycardia and diagnosed with PE 11/06/17.  Pt PMHx includes: Anxiety, CA, GERD, Headache    PT Comments    Pt able to progress to ambulating 90 feet with RW CGA (distance limited d/t fatigue).  HR 96-120 bpm and O2 on room air 96% or greater during session.  Pain 4/10 L knee with ambulation and 2-3/10 at rest.  L knee flexion ROM 76 degrees (limited d/t pain).  Will continue to progress ambulation distance and trial stairs next session as appropriate.    Follow Up Recommendations  Home health PT;Supervision for mobility/OOB     Equipment Recommendations  Rolling walker with 5" wheels;3in1 (PT)    Recommendations for Other Services       Precautions / Restrictions Precautions Precautions: Fall;Knee Precaution Booklet Issued: Yes (comment) Restrictions Weight Bearing Restrictions: Yes LLE Weight Bearing: Weight bearing as tolerated    Mobility  Bed Mobility Overal bed mobility: Needs Assistance Bed Mobility: Supine to Sit;Sit to Supine     Supine to sit: Supervision Sit to supine: Supervision   General bed mobility comments: pt using R LE to assist L LE supine to/from sit with bed semi flat; increased effort and time for pt to perform on own  Transfers Overall transfer level: Needs assistance Equipment used: Rolling walker (2 wheeled) Transfers: Sit to/from [facility] Sit to Stand: Min guard Stand pivot transfers: Min guard       General transfer comment: increased effort to stand with RW but no physical assist required; occasional vc's for positioning/technique required initially  Ambulation/Gait Ambulation/Gait assistance: Min guard Ambulation Distance (Feet): 90 Feet Assistive device: Rolling walker (2  wheeled) Gait Pattern/deviations: Step-to pattern Gait velocity: decreased   General Gait Details: vc's for walker use and positioning within RW initially, vc's for increasing L LE step length initially; step to gait pattern; occasional vc's to increase UE support through [employer]    Modified Rankin (Stroke Patients Only)       Balance Overall balance assessment: Needs assistance Sitting-balance support: Feet supported;No upper extremity supported Sitting balance-Leahy Scale: Normal Sitting balance - Comments: steady sitting reaching outside BOS   Standing balance support: Single extremity supported Standing balance-Leahy Scale: Poor Standing balance comment: pt requires at least single UE support for static standing balance                            Cognition Arousal/Alertness: Awake/alert Behavior During Therapy: WFL for tasks assessed/performed Overall Cognitive Status: Within Functional Limits for tasks assessed                                        Exercises Total Joint Exercises Goniometric ROM: L knee flexion 76 degrees sitting edge of bed    General Comments General comments (skin integrity, edema, etc.): L knee honeycomb dressing in place; mild to moderate drainage noted in dressing beginning and end of session      Pertinent Vitals/Pain Pain Assessment: 0-10 Pain Score: 3 (4/10 with ambulation) Pain Location: Left Knee Pain Descriptors /  Indicators: Aching Pain Intervention(s): Limited activity within patient's tolerance;Monitored during session;Repositioned;Patient requesting pain meds-RN notified;RN gave pain meds during session;Ice applied    Home Living                      Prior Function            PT Goals (current goals can now be found in the care plan section) Acute Rehab PT Goals Patient Stated Goal: to improve functional mobility PT Goal Formulation: With  patient Time For Goal Achievement: 11/23/17 Potential to Achieve Goals: Good Additional Goals Additional Goal #1: Pt will be able to perform bed mobility/transfers with supervision and safe technique in order to improve functional independence Progress towards PT goals: Progressing toward goals    Frequency    BID      PT Plan Current plan remains appropriate    Co-evaluation              AM-PAC PT "6 Clicks" Daily Activity  Outcome Measure  Difficulty turning over in bed (including adjusting bedclothes, sheets and blankets)?: A Little Difficulty moving from lying on back to sitting on the side of the bed? : A Lot Difficulty sitting down on and standing up from a chair with arms (e.g., wheelchair, bedside commode, etc,.)?: Unable Help needed moving to and from a bed to chair (including a wheelchair)?: A Little Help needed walking in hospital room?: A Little Help needed climbing 3-5 steps with a railing? : A Little 6 Click Score: 15    End of Session Equipment Utilized During Treatment: Gait belt Activity Tolerance: Patient tolerated treatment well Patient left: in bed;with call bell/phone within reach;with bed alarm set;with family/visitor present;with SCD's reapplied(L heel in bone foam; R heel elevated via towel roll; polar care in place L knee and activated) Nurse Communication: Mobility status;Precautions;Patient requests pain meds;Weight bearing status PT Visit Diagnosis: Muscle weakness (generalized) (M62.81);Difficulty in walking, not elsewhere classified (R26.2);Pain Pain - Right/Left: Left Pain - part of body: Knee     Time: 9357-0177 PT Time Calculation (min) (ACUTE ONLY): 38 min  Charges:  $Gait Training: 8-22 mins $Therapeutic Exercise: 8-22 mins $Therapeutic Activity: 8-22 mins                    G CodesLeitha Bleak, PT 11/09/17, 5:29 PM [phone]

## 2017-11-09 NOTE — Progress Notes (Signed)
Physical Therapy Treatment Patient Details Name: Johnny Freeman MRN: [id number] DOB: [birth year] Today's Date: 11/09/2017    History of Present Illness Pt is a 67 y.o. male who was admitted to [facility] 11/04/17 for a Left TKR.  Pt noted with sinus tachycardia and diagnosed with PE 11/06/17.  Pt PMHx includes: Anxiety, CA, GERD, Headache    PT Comments    Pt able to progress to ambulating 30 feet with RW CGA this morning (limited distance d/t pt c/o lightheadedness which resolved within a couple minutes of sitting; HR 90-105 bpm and O2 93% or greater on room air during session).  Overall pt did fairly well considering limited mobility last few days (very limited mobility on Thursday d/t elevated HR and found to have PE; PT held on Friday and Saturday per PT protocol for PE).  L knee extension ROM 10 degrees short of neutral and pt educated on importance of increasing extension and positioning techniques to improve L knee extension.  Will continue to progress pt with ambulation distance today per pt tolerance; will also assess L knee flexion ROM this afternoon.    Follow Up Recommendations  Home health PT;Supervision for mobility/OOB     Equipment Recommendations  Rolling walker with 5" wheels;3in1 (PT)    Recommendations for Other Services       Precautions / Restrictions Precautions Precautions: Fall;Knee Precaution Booklet Issued: Yes (comment) Restrictions Weight Bearing Restrictions: Yes LLE Weight Bearing: Weight bearing as tolerated    Mobility  Bed Mobility Overal bed mobility: Needs Assistance Bed Mobility: Supine to Sit     Supine to sit: Supervision     General bed mobility comments: pt using R LE to assist L LE supine to sit with bed flat; increased effort and time for pt to perform on own  Transfers Overall transfer level: Needs assistance Equipment used: Rolling walker (2 wheeled) Transfers: Sit to/from Stand Sit to Stand: Min guard;Min assist         General  transfer comment: minimal assist to stand from bed with vc's for positioning/technique; use of RW  Ambulation/Gait Ambulation/Gait assistance: Min guard Ambulation Distance (Feet): 30 Feet Assistive device: Rolling walker (2 wheeled) Gait Pattern/deviations: Step-to pattern Gait velocity: decreased   General Gait Details: vc's for walker use and positioning within RW, vc's for increasing L LE step length; step to gait pattern; occasional vc's to increase UE support through [employer]    Modified Rankin (Stroke Patients Only)       Balance Overall balance assessment: Needs assistance Sitting-balance support: Feet supported;No upper extremity supported Sitting balance-Leahy Scale: Normal Sitting balance - Comments: steady sitting reaching outside BOS   Standing balance support: Single extremity supported Standing balance-Leahy Scale: Poor Standing balance comment: pt requires at least single UE support for static standing balance                            Cognition Arousal/Alertness: Awake/alert Behavior During Therapy: WFL for tasks assessed/performed Overall Cognitive Status: Within Functional Limits for tasks assessed                                        Exercises Total Joint Exercises Ankle Circles/Pumps: AROM;Strengthening;Both;10 reps;Supine Quad Sets: AROM;Strengthening;Both;10 reps;Supine Short Arc Quad: AAROM;AROM;Strengthening;Left;10 reps;Supine Heel Slides: AAROM;Strengthening;Left;10 reps;Supine  Hip ABduction/ADduction: AAROM;Strengthening;Left;10 reps;Supine Straight Leg Raises: AAROM;AROM;Strengthening;Left;10 reps;Supine Goniometric ROM: L knee extension 10 degrees short of neutral semi-supine in bed with gentle overpressure    General Comments General comments (skin integrity, edema, etc.): L knee honeycomb dressing in place; mild drainage noted in dressing beginning and end of session.   Pt laying in bed and reporting nausea upon PT arrival (symptoms improved during session and pt reporting recent nausea meds).  No bone foam or towel roll under L LE upon PT arrival; L LE also noted to be externally rotated in bed with knee flexed.      Pertinent Vitals/Pain Pain Assessment: 0-10 Pain Score: 1  Pain Location: Left Knee Pain Descriptors / Indicators: Aching Pain Intervention(s): Limited activity within patient's tolerance;Monitored during session;Premedicated before session;Repositioned;Ice applied    Home Living                      Prior Function            PT Goals (current goals can now be found in the care plan section) Acute Rehab PT Goals Patient Stated Goal: to improve functional mobility PT Goal Formulation: With patient Time For Goal Achievement: 11/23/17 Potential to Achieve Goals: Good Additional Goals Additional Goal #1: Pt will be able to perform bed mobility/transfers with supervision and safe technique in order to improve functional independence Progress towards PT goals: Progressing toward goals    Frequency    BID      PT Plan Current plan remains appropriate    Co-evaluation              AM-PAC PT "6 Clicks" Daily Activity  Outcome Measure  Difficulty turning over in bed (including adjusting bedclothes, sheets and blankets)?: A Little Difficulty moving from lying on back to sitting on the side of the bed? : A Lot Difficulty sitting down on and standing up from a chair with arms (e.g., wheelchair, bedside commode, etc,.)?: Unable Help needed moving to and from a bed to chair (including a wheelchair)?: A Little Help needed walking in hospital room?: A Little Help needed climbing 3-5 steps with a railing? : A Lot 6 Click Score: 14    End of Session Equipment Utilized During Treatment: Gait belt Activity Tolerance: Other (comment)(Limited ambulation d/t pt c/o lightheadedness with ambulation (nursing notified)) Patient  left: in chair;with call bell/phone within reach;with chair alarm set;with family/visitor present(B heels elevated via towel rolls; polar care in place and activated) Nurse Communication: Mobility status;Precautions;Other (comment)(Pt's c/o lightheadedness with ambulation) PT Visit Diagnosis: Muscle weakness (generalized) (M62.81);Difficulty in walking, not elsewhere classified (R26.2);Pain Pain - Right/Left: Left Pain - part of body: Knee     Time: 1610-9604 PT Time Calculation (min) (ACUTE ONLY): 42 min  Charges:  $Gait Training: 8-22 mins $Therapeutic Exercise: 8-22 mins $Therapeutic Activity: 8-22 mins                    G CodesLeitha Bleak, PT 11/09/17, 11:36 AM [phone]

## 2017-11-09 NOTE — Progress Notes (Signed)
   Subjective: 5 Days Post-Op Procedure(s) (LRB): TOTAL KNEE ARTHROPLASTY (Left) CYSTOSCOPY (N/A) URETERAL DILITATION (N/A) Patient reports pain as 0 out of 10.  Diagnosed with PE Thursday, started on Eliquis. He denies any chest pain, shortness of breath, abdominal pain, cough, lower extremity pain or swelling.  He also denies any chest palpitations, dizziness or lightheadedness. We will begin physical therapy today per PT protocol.  Plan is to go Home after hospital stay. Possible discharge home today.  Objective: Vital signs in last 24 hours: Temp:  [98.3 F (36.8 C)-99.6 F (37.6 C)] 99.2 F (37.3 C) (02/10 0457) Pulse Rate:  [99-101] 101 (02/10 0457) Resp:  [18-19] 19 (02/10 0457) BP: (120-148)/(72-82) 127/72 (02/10 0457) SpO2:  [95 %-96 %] 95 % (02/10 0457)  Intake/Output from previous day: 02/09 0701 - 02/10 0700 In: 4889.2 [P.O.:360; I.V.:4529.2] Out: 4034 [Urine:3475] Intake/Output this shift: No intake/output data recorded.  No results for input(s): HGB in the last 72 hours. No results for input(s): WBC, RBC, HCT, PLT in the last 72 hours. No results for input(s): NA, K, CL, CO2, BUN, CREATININE, GLUCOSE, CALCIUM in the last 72 hours. Recent Labs    11/07/17 0120  INR 1.10    EXAM General - Patient is Alert, Appropriate and Oriented  Lungs-clear to auscultation with no wheezing rales or rhonchi Cardio- patient tachycardic, HR manually 100 Extremity - Neurovascular intact Sensation intact distally Intact pulses distally Dorsiflexion/Plantar flexion intact No cellulitis present Compartment soft  bilateral lower extremity soft, nontender to compression. Dressing - dressing C/D/I and no drainage, dressing changed to honeycomb.  Incision site appears well.  No signs of any infection. Motor Function - intact, moving foot and toes well on exam.   Past Medical History:  Diagnosis Date  . Anxiety   . Cancer [facility])    prostate 2011  . GERD (gastroesophageal  reflux disease)   . Headache    migraines    Assessment/Plan:   5 Days Post-Op Procedure(s) (LRB): TOTAL KNEE ARTHROPLASTY (Left) CYSTOSCOPY (N/A) URETERAL DILITATION (N/A) Active Problems:   Primary localized osteoarthritis of left knee   Pulmonary Emboli   Acute post op blood loss anemia   Estimated body mass index is 33.67 kg/m as calculated from the following:   Height as of this encounter: 5\' 7"  (1.702 m).   Weight as of this encounter: 97.5 kg (215 lb).  Acute post op blood loss anemia -hemoglobin stable at 11.9.    Continue with Foley catheter, follow up with Urology Tuesday.  PE- HR improving. Patient started on Eliquis. Follow up with oncology/hematology next week. Echo pending.  Begin physical therapy today after 48 hours of holding therapy per protocol. Patient has had a bowel movement. Discharge home today, but possible hold discharge depending on mobility.  Appreciate medicine Input  DVT Prophylaxis - Foot Pumps, TED hose and Eliquis 5mg  BID Weight-Bearing as tolerated to left leg   Reche Dixon, PA-C [place] 11/09/2017, 7:07 AM

## 2017-11-09 NOTE — Discharge Instructions (Signed)

## 2017-11-09 NOTE — Care Management Note (Addendum)
Case Management Note  Patient Details  Name: NYCERE PRESLEY MRN: [id number] Date of Birth: [birth year]  Subjective/Objective:  Discussed discharge planning with Mr Fahrner and his wife. They chose [facility] to be their home health provider. A referral for HH=PT, RN was called to [place] at [place]. A request for a RW and a BSC was called to [place] at [facility].  Provided Mr Mickelson with an Eliquis coupon.                 Action/Plan:   Expected Discharge Date:  11/09/17               Expected Discharge Plan:     In-House Referral:     Discharge planning Services     Post Acute Care Choice:    Choice offered to:     DME Arranged:    DME Agency:     HH Arranged:    HH Agency:     Status of Service:     If discussed at [employer], dates discussed:    Additional Comments:  Zipporah Finamore A, RN 11/09/2017, 8:27 AM

## 2017-11-10 ENCOUNTER — Encounter: Payer: Self-pay | Admitting: Orthopedic Surgery

## 2017-11-10 LAB — CBC
HCT: 29.8 % — ABNORMAL LOW (ref 40.0–52.0)
HEMOGLOBIN: 10.1 g/dL — AB (ref 13.0–18.0)
MCH: 29.7 pg (ref 26.0–34.0)
MCHC: 33.8 g/dL (ref 32.0–36.0)
MCV: 87.8 fL (ref 80.0–100.0)
Platelets: 309 10*3/uL (ref 150–440)
RBC: 3.4 MIL/uL — AB (ref 4.40–5.90)
RDW: 13.3 % (ref 11.5–14.5)
WBC: 8.3 10*3/uL (ref 3.8–10.6)

## 2017-11-10 LAB — CREATININE, SERUM
CREATININE: 0.85 mg/dL (ref 0.61–1.24)
GFR calc Af Amer: >60 mL/min (ref >60)

## 2017-11-10 NOTE — Progress Notes (Signed)
Physical Therapy Treatment Patient Details Name: Johnny Freeman MRN: [id number] DOB: [birth year] Today's Date: 11/10/2017    History of Present Illness Pt is a 67 y.o. male who was admitted to [facility] 11/04/17 for a Left TKR.  Pt noted with sinus tachycardia and diagnosed with PE 11/06/17.  Pt PMHx includes: Anxiety, CA, GERD, Headache    PT Comments    Pt able to ambulate 160 feet with RW (CGA initially but progressed to SBA); pt steady with ambulation using RW.  Pt able to navigate stairs with UE support safely with railing or RW (no knee buckling noted and pt steady; pt and pt's wife report no concerns regarding stairs and reporting no need to practice stairs again prior to discharge).  Pain 5/10 L knee with ambulation.  L knee flexion 86 degrees today; L knee extension 6 degrees short of neutral.  HR 98-120 bpm during session; O2 96% or greater on room air during session.  Pt appears safe to discharge home with HHPT and support of wife (nursing notified).  Pt and pt's wife report they will have a male friend that is going to come help with stairs for safety.    Follow Up Recommendations  Home health PT;Supervision for mobility/OOB     Equipment Recommendations  Rolling walker with 5" wheels;3in1 (PT)    Recommendations for Other Services       Precautions / Restrictions Precautions Precautions: Fall;Knee Precaution Booklet Issued: Yes (comment) Restrictions Weight Bearing Restrictions: Yes LLE Weight Bearing: Weight bearing as tolerated    Mobility  Bed Mobility Overal bed mobility: Modified Independent Bed Mobility: Supine to Sit     Supine to sit: Modified independent (Device/Increase time)     General bed mobility comments: pt able to bring L LE off of bed on own; mild increased effort to perform on own; bed flat  Transfers Overall transfer level: Needs assistance Equipment used: Rolling walker (2 wheeled) Transfers: Sit to/from [facility] Sit to Stand:  Supervision Stand pivot transfers: Supervision       General transfer comment: increased effort to stand with RW but no physical assist required; no vc's for technique required  Ambulation/Gait Ambulation/Gait assistance: Min guard;Supervision Ambulation Distance (Feet): 160 Feet Assistive device: Rolling walker (2 wheeled) Gait Pattern/deviations: Step-to pattern Gait velocity: decreased   General Gait Details: initial vc's for foot flat L LE (d/t pt ambulating on forefoot of L LE and L knee noted to be mildly flexed); with distance and improved L knee extension, pt given vc's for L heelstrike with improved gait pattern noted with distance ambulated   Stairs Stairs: Yes  Assist:  CGA x2 provided for safety but not required Stair Management: One rail Right;With walker;Step to pattern Number of Stairs: 4 General stair comments: ascended/descending 1 step with RW CGA; ascended/descended 3 stairs with R railing CGA; initial vc's and demo required for stairs technique; increased effort to perform noted but pt steady and no knee buckling noted  Wheelchair Mobility    Modified Rankin (Stroke Patients Only)       Balance Overall balance assessment: Needs assistance Sitting-balance support: Feet supported;No upper extremity supported Sitting balance-Leahy Scale: Normal Sitting balance - Comments: steady sitting reaching outside BOS   Standing balance support: Single extremity supported Standing balance-Leahy Scale: Poor Standing balance comment: pt requires at least single UE support for static standing balance  Cognition Arousal/Alertness: Awake/alert Behavior During Therapy: WFL for tasks assessed/performed Overall Cognitive Status: Within Functional Limits for tasks assessed                                        Exercises Total Joint Exercises Quad Sets: AROM;Strengthening;Both;10 reps;Supine Short Arc Quad:  AROM;Strengthening;Left;10 reps;Supine Heel Slides: AAROM;Strengthening;Left;10 reps;Supine Straight Leg Raises: AROM;Strengthening;Left;10 reps;Supine Knee Flexion: AAROM;Strengthening;Left;Seated(x3 reps with 15 second holds each) Goniometric ROM: L knee extension 6 degrees short of neutral semi-supine in bed with overpressure; L knee flexion 86 degrees AAROM sitting edge of recliner    General Comments General comments (skin integrity, edema, etc.): L knee honeycomb dressing in place; mild drainage noted in dressing beginning and end of session.  Nursing cleared pt for participation in physical therapy.  Pt agreeable to PT session.  Pt and pt's wife educated (verbal and visual demonstration) on safe car transfers and how to use gait belt safely (pt's wife demonstrating and verbalizing safe use; gait belt issued to pt for home use) for stairs safety.  Pt's new RW adjusted for appropriate fit during session.      Pertinent Vitals/Pain Pain Assessment: 0-10 Pain Score: 5  Pain Location: Left Knee Pain Descriptors / Indicators: Aching Pain Intervention(s): Limited activity within patient's tolerance;Monitored during session;Repositioned;Patient requesting pain meds-RN notified;RN gave pain meds during session;Ice applied    Home Living                      Prior Function            PT Goals (current goals can now be found in the care plan section) Acute Rehab PT Goals Patient Stated Goal: to improve functional mobility PT Goal Formulation: With patient Time For Goal Achievement: 11/23/17 Potential to Achieve Goals: Good Additional Goals Additional Goal #1: Pt will be able to perform bed mobility/transfers with supervision and safe technique in order to improve functional independence Progress towards PT goals: Progressing toward goals    Frequency    BID      PT Plan Current plan remains appropriate    Co-evaluation              AM-PAC PT "6 Clicks" Daily  Activity  Outcome Measure  Difficulty turning over in bed (including adjusting bedclothes, sheets and blankets)?: A Little Difficulty moving from lying on back to sitting on the side of the bed? : A Little Difficulty sitting down on and standing up from a chair with arms (e.g., wheelchair, bedside commode, etc,.)?: A Little Help needed moving to and from a bed to chair (including a wheelchair)?: A Little Help needed walking in hospital room?: A Little Help needed climbing 3-5 steps with a railing? : A Little 6 Click Score: 18    End of Session Equipment Utilized During Treatment: Gait belt Activity Tolerance: Other (comment)(Pt requesting rest breaks during session.) Patient left: in chair;with call bell/phone within reach;with chair alarm set;with family/visitor present(L heel elevated via towel roll; polar care in place and activated) Nurse Communication: Mobility status;Precautions;Patient requests pain meds;Weight bearing status PT Visit Diagnosis: Muscle weakness (generalized) (M62.81);Difficulty in walking, not elsewhere classified (R26.2);Pain Pain - Right/Left: Left Pain - part of body: Knee     Time: 1030-1140 PT Time Calculation (min) (ACUTE ONLY): 70 min  Charges:  $Gait Training: 23-37 mins $Therapeutic Exercise: 23-37 mins $Therapeutic Activity: 8-22 mins  G CodesLeitha Bleak, PT 11/10/17, 12:05 PM [phone]

## 2017-11-10 NOTE — Progress Notes (Signed)
   Subjective: 6 Days Post-Op Procedure(s) (LRB): TOTAL KNEE ARTHROPLASTY (Left) CYSTOSCOPY (N/A) URETERAL DILITATION (N/A) Patient reports pain as 0 out of 10.  Diagnosed with PE Thursday, started on Eliquis. He denies any chest pain, shortness of breath, abdominal pain, cough, lower extremity pain or swelling.  He also denies any chest palpitations, dizziness or lightheadedness. We will begin physical therapy today per PT protocol.  Plan is to go Home after hospital stay. Possible discharge home today.  Objective: Vital signs in last 24 hours: Temp:  [98.2 F (36.8 C)-99 F (37.2 C)] 98.2 F (36.8 C) (02/11 0344) Pulse Rate:  [91-99] 91 (02/11 0344) Resp:  [18-26] 20 (02/11 0344) BP: (134-144)/(68-76) 134/76 (02/11 0344) SpO2:  [92 %-96 %] 92 % (02/11 0344)  Intake/Output from previous day: 02/10 0701 - 02/11 0700 In: 3008.8 [P.O.:840; I.V.:2168.8] Out: 3200 [Urine:3200] Intake/Output this shift: No intake/output data recorded.  Recent Labs    11/10/17 0409  HGB 10.1*   Recent Labs    11/10/17 0409  WBC 8.3  RBC 3.40*  HCT 29.8*  PLT 309   Recent Labs    11/10/17 0409  CREATININE 0.85   No results for input(s): LABPT, INR in the last 72 hours.  EXAM General - Patient is Alert, Appropriate and Oriented  Lungs-clear to auscultation with no wheezing rales or rhonchi Cardio- Normal Heart Rate Extremity - Neurovascular intact Sensation intact distally Intact pulses distally Dorsiflexion/Plantar flexion intact No cellulitis present Compartment soft  bilateral lower extremity soft, nontender to compression. Dressing - dressing C/D/I and no drainage Incision site appears well.  No signs of any infection. Motor Function - intact, moving foot and toes well on exam.   Past Medical History:  Diagnosis Date  . Anxiety   . Cancer [facility])    prostate 2011  . GERD (gastroesophageal reflux disease)   . Headache    migraines    Assessment/Plan:   6 Days  Post-Op Procedure(s) (LRB): TOTAL KNEE ARTHROPLASTY (Left) CYSTOSCOPY (N/A) URETERAL DILITATION (N/A) Active Problems:   Primary localized osteoarthritis of left knee   Pulmonary Emboli   Acute post op blood loss anemia   Estimated body mass index is 33.67 kg/m as calculated from the following:   Height as of this encounter: 5\' 7"  (1.702 m).   Weight as of this encounter: 97.5 kg (215 lb).  Acute post op blood loss anemia -hemoglobin stable at 10.1.    Continue with Foley catheter, follow up with Urology scheduled for tomorrow. Will call urology today to see if we can remove foley today and start voiding trial.  PE- HR improving. Patient started on Eliquis. Follow up with oncology/hematology this week. Echo pending.  Continue with PT. Patient has had a bowel movement.  Possible discharge today to home with home health PT  Appreciate medicine Input  DVT Prophylaxis - Foot Pumps, TED hose and Eliquis 5mg  BID Weight-Bearing as tolerated to left leg   Duanne Guess PA-C [place] 11/10/2017, 8:11 AM

## 2017-11-10 NOTE — Progress Notes (Signed)
Discharge summary reviewed with verbal understanding. Answered all questions. 2 prescriptions given upon discharge. Belongings packed

## 2017-11-10 NOTE — Care Management (Signed)
Home health orders requested for PT as they can assess pulmonary status and urinary output. Wife is requesting RN however I have explained to her that PT can monitor for that need.  Wife/patient hope that Foley can be removed today prior to discharge however he has a follow up appointment with urologist tomorrow. I have updated Sonia Side with [facility] of patient plan for discharge today. DME has been delivered to this room.  No other RNCM needs.

## 2017-11-11 LAB — CULTURE, BLOOD (ROUTINE X 2)
CULTURE: NO GROWTH
Culture: NO GROWTH
Special Requests: ADEQUATE
Special Requests: ADEQUATE

## 2017-12-01 ENCOUNTER — Encounter (INDEPENDENT_AMBULATORY_CARE_PROVIDER_SITE_OTHER): Payer: Self-pay

## 2017-12-01 ENCOUNTER — Inpatient Hospital Stay: Payer: Managed Care, Other (non HMO) | Attending: Hematology and Oncology | Admitting: Hematology and Oncology

## 2017-12-01 ENCOUNTER — Inpatient Hospital Stay: Payer: Managed Care, Other (non HMO)

## 2017-12-01 ENCOUNTER — Encounter: Payer: Self-pay | Admitting: Hematology and Oncology

## 2017-12-01 VITALS — BP 142/76 | HR 103 | Temp 98.6°F | Resp 20 | Wt 210.5 lb

## 2017-12-01 DIAGNOSIS — I2699 Other pulmonary embolism without acute cor pulmonale: Secondary | ICD-10-CM | POA: Diagnosis present

## 2017-12-01 DIAGNOSIS — Z7901 Long term (current) use of anticoagulants: Secondary | ICD-10-CM | POA: Diagnosis not present

## 2017-12-01 DIAGNOSIS — Z8546 Personal history of malignant neoplasm of prostate: Secondary | ICD-10-CM | POA: Diagnosis not present

## 2017-12-01 DIAGNOSIS — Z8249 Family history of ischemic heart disease and other diseases of the circulatory system: Secondary | ICD-10-CM | POA: Insufficient documentation

## 2017-12-01 DIAGNOSIS — D649 Anemia, unspecified: Secondary | ICD-10-CM

## 2017-12-01 LAB — CBC WITH DIFFERENTIAL/PLATELET
BASOS ABS: 0.1 10*3/uL (ref 0–0.1)
Basophils Relative: 1 %
Eosinophils Absolute: 0.1 10*3/uL (ref 0–0.7)
Eosinophils Relative: 1 %
HEMATOCRIT: 36.6 % — AB (ref 40.0–52.0)
Hemoglobin: 12.4 g/dL — ABNORMAL LOW (ref 13.0–18.0)
Lymphocytes Relative: 30 %
Lymphs Abs: 1.9 10*3/uL (ref 1.0–3.6)
MCH: 29.4 pg (ref 26.0–34.0)
MCHC: 34 g/dL (ref 32.0–36.0)
MCV: 86.7 fL (ref 80.0–100.0)
Monocytes Absolute: 0.8 10*3/uL (ref 0.2–1.0)
Monocytes Relative: 13 %
NEUTROS ABS: 3.6 10*3/uL (ref 1.4–6.5)
NEUTROS PCT: 55 %
PLATELETS: 324 10*3/uL (ref 150–440)
RBC: 4.23 MIL/uL — AB (ref 4.40–5.90)
RDW: 13.9 % (ref 11.5–14.5)
WBC: 6.5 10*3/uL (ref 3.8–10.6)

## 2017-12-01 LAB — COMPREHENSIVE METABOLIC PANEL
ALBUMIN: 3.9 g/dL (ref 3.5–5.0)
ALT: 22 U/L (ref 17–63)
ANION GAP: 6 (ref 5–15)
AST: 21 U/L (ref 15–41)
Alkaline Phosphatase: 86 U/L (ref 38–126)
BILIRUBIN TOTAL: 0.5 mg/dL (ref 0.3–1.2)
BUN: 13 mg/dL (ref 6–20)
CO2: 26 mmol/L (ref 22–32)
Calcium: 8.9 mg/dL (ref 8.9–10.3)
Chloride: 98 mmol/L — ABNORMAL LOW (ref 101–111)
Creatinine, Ser: 0.88 mg/dL (ref 0.61–1.24)
Glucose, Bld: 102 mg/dL — ABNORMAL HIGH (ref 65–99)
POTASSIUM: 4.3 mmol/L (ref 3.5–5.1)
Sodium: 130 mmol/L — ABNORMAL LOW (ref 135–145)
TOTAL PROTEIN: 7.3 g/dL (ref 6.5–8.1)

## 2017-12-01 LAB — FERRITIN: Ferritin: 113 ng/mL (ref 24–336)

## 2017-12-01 LAB — IRON AND TIBC
IRON: 35 ug/dL — AB (ref 45–182)
SATURATION RATIOS: 11 % — AB (ref 17.9–39.5)
TIBC: 316 ug/dL (ref 250–450)
UIBC: 281 ug/dL

## 2017-12-01 LAB — PSA: PROSTATIC SPECIFIC ANTIGEN: 0.12 ng/mL (ref 0.00–4.00)

## 2017-12-01 MED ORDER — APIXABAN 5 MG PO TABS: 5.0000 mg | ORAL_TABLET | Freq: Two times a day (BID) | ORAL | 0 refills | Status: DC

## 2017-12-01 NOTE — Progress Notes (Signed)
Patient here today as hospital follow up regarding bilateral PE's after having left knee replacement.  Patient states he is doing well.

## 2017-12-01 NOTE — Progress Notes (Signed)
[facility] day:  12/01/2017  Chief Complaint: Johnny Freeman is a 67 y.o. male with acute pulmonary embolism s/p left knee replacement who is referred in consultation by Dr. Harrel Lemon for assessment and management.  HPI:  He has a history of osteoarthritis of the left knee.  He underwent left total knee arthoplasty on 11/04/2017 by Dr. Rudene Christians.  On post-operative day 2, he developed tachycardia.    Chest CT angiogram on 11/06/2017 revealed acute pulmonary emboli within distal left lower lobe pulmonary artery with extension of thrombus into multiple segmental and subsegmental branches of the left lower lobe. Additional emboli was visualized within right upper lobe segmental and subsegmental branch vessels with suspected small emboli in right lower lobe and left upper lobe subsegmental branches. Imaging was positive for acute PE with CT evidence of right heart strain (RV/LV ratio = 1.46) consistent with at least submassive (intermediate risk) PE.  He was treated with heparin and transitioned to Eliquis.  CBC on admission revealed a hematocrit of 38.9, hemoglobin 13.1, MCV 87.8, platelets 244,000, WBC 11,600.  CBC on 11/10/2017 revealed a hematocrit of 29.8 and hemoglobin 10.1.  Creatinine ranged between 0.85 - 0.99.  He has a history of prostate cancer diagnosed in 2011.  He presented with an elevated PSA.  He is followed by Dr. Yves Dill. He was treated in the [place] with the HIFU procedure.  PSA was 0.1 in 03/2017.  Symptomatically, he is doing well. He denies any pain or swelling in his lower extremities. Patient has not had any episodes of chest pain or shortness of breath. He continues on Eliquis 5 mg twice a day.  Patient denies bleeding; no hematochezia, melena, or gross hematuria. Patient has not appreciated any areas of increased bruising.    He denies any family history significant for any type of malignancy or hematologic disorders.  Father  suffered a MI at the age of 58. His son died at age 26 from a MI. Patient denies any family history of early pregnancy loss.   Last colonoscopy has been within the last 10 years. Procedure was done by Dr. Vira Agar.    Past Medical History:  Diagnosis Date  . Anxiety   . Cancer [facility])    prostate 2011  . GERD (gastroesophageal reflux disease)   . Headache    migraines    Past Surgical History:  Procedure Laterality Date  . CYSTOSCOPY N/A 11/04/2017   Procedure: CYSTOSCOPY;  Surgeon: Hessie Knows, MD;  Location: [facility] ORS;  Service: Orthopedics;  Laterality: N/A;  . ROTATOR CUFF REPAIR Right 2003  . TOTAL KNEE ARTHROPLASTY Left 11/04/2017   Procedure: TOTAL KNEE ARTHROPLASTY;  Surgeon: Hessie Knows, MD;  Location: [facility] ORS;  Service: Orthopedics;  Laterality: Left;    History reviewed. No pertinent family history.  Social History:  reports that he quit smoking about 45 years ago. he has never used smokeless tobacco. He reports that he does not drink alcohol or use drugs.  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  He lives in [place]. Patient had 2 children; one passed at age 32 from a MI. The patient is accompanied by his wife, Jacqlyn Larsen, today.  Allergies:  Allergies  Allergen Reactions  . Shellfish Allergy Swelling    Swelling in eyes    Current Medications: Current Outpatient Medications  Medication Sig Dispense Refill  . acetaminophen (TYLENOL) 500 MG tablet Take 500 mg by mouth 2 (two) times daily as needed (FOR PAIN.).    Marland Kitchen  apixaban (ELIQUIS) 5 MG TABS tablet Take 1 tablet (5 mg total) by mouth 2 (two) times daily. 60 tablet 0  . aspirin-acetaminophen-caffeine (EXCEDRIN MIGRAINE) 250-250-65 MG tablet Take 2 tablets by mouth every 6 (six) hours as needed for migraine.    . Cholecalciferol (VITAMIN D-3 PO) Take 1 tablet by mouth daily with lunch.    . diltiazem (CARDIZEM CD) 120 MG 24 hr capsule Take 120 mg by mouth daily.    Marland Kitchen docusate sodium (COLACE) 100 MG capsule Take 100  mg by mouth daily.    . methocarbamol (ROBAXIN) 500 MG tablet Take 500 mg by mouth as needed.    . Multiple Vitamin (MULTIVITAMIN WITH MINERALS) TABS tablet Take 1 tablet by mouth daily.    Marland Kitchen omeprazole (PRILOSEC) 20 MG capsule Take 20 mg by mouth daily before supper. Take 1 capsule (20 mg) by mouth 30 minutes before supper  0  . oxyCODONE (OXY IR/ROXICODONE) 5 MG immediate release tablet Take 1 tablet (5 mg total) by mouth every 4 (four) hours as needed for moderate pain ((score 4 to 6)). 40 tablet 0  . Ascorbic Acid (VITAMIN C PO) Take 1 tablet by mouth daily.    . Omega-3 Fatty Acids (FISH OIL PO) Take 2 capsules by mouth daily with lunch.     No current facility-administered medications for this visit.     Review of Systems:  GENERAL:  Feels good.  No fevers or sweats.  Weight loss of 10 pounds since surgery and a change in diet. PERFORMANCE STATUS (ECOG):  1 HEENT:  No visual changes, runny nose, sore throat, mouth sores or tenderness. Lungs: No shortness of breath.  Slight cough.  No hemoptysis. Cardiac:  No chest pain, palpitations, orthopnea, or PND. GI:  Constipation.  No nausea, vomiting, diarrhea, melena or hematochezia. Colonoscopy within the past 10 years. GU:  No urgency, frequency, dysuria, or hematuria. Musculoskeletal:  No back pain.  s/p left knee replacement.  Needs right knee done.  No muscle tenderness. Extremities:  Little swelling in left leg.  No pain. Skin:  No rashes or skin changes. Neuro:  No headache, numbness or weakness, balance or coordination issues. Endocrine:  No diabetes, thyroid issues, hot flashes or night sweats. Psych:  No mood changes, depression or anxiety. Pain:  No focal pain. Review of systems:  All other systems reviewed and found to be negative.  Physical Exam: Blood pressure (!) 142/76, pulse (!) 103, temperature 98.6 F (37 C), temperature source Tympanic, resp. rate 20, weight 210 lb 8 oz (95.5 kg). GENERAL:  Well developed, well  nourished, gentleman sitting comfortably in the exam room in no acute distress.  He has a cane at his side. MENTAL STATUS:  Alert and oriented to person, place and time. HEAD:  White hair with beard.  Normocephalic, atraumatic, face symmetric, no Cushingoid features. EYES:  Glasses.  Brown eyes.  Pupils equal round and reactive to light and accomodation.  No conjunctivitis or scleral icterus. ENT:  Oropharynx clear without lesion.  Tongue normal. Mucous membranes moist.  RESPIRATORY:  Clear to auscultation without rales, wheezes or rhonchi. CARDIOVASCULAR:  Regular rate and rhythm without murmur, rub or gallop. ABDOMEN:  Soft, non-tender, with active bowel sounds, and no hepatosplenomegaly.  No masses. SKIN:  No rashes, ulcers or lesions. EXTREMITIES: Subtle left lower extremity edema.  No skin discoloration or tenderness.  No palpable cords. LYMPH NODES: No palpable cervical, supraclavicular, axillary or inguinal adenopathy  NEUROLOGICAL: Unremarkable. PSYCH:  Appropriate.   No  visits with results within 3 Day(s) from this visit.  Latest known visit with results is:  Admission on 11/04/2017, Discharged on 11/10/2017  Component Date Value Ref Range Status  . ABO/RH(D) 11/04/2017    Final                   Value:O POS Performed at [facility], [address], [place], [place] [address]   . WBC 11/04/2017 11.6* 3.8 - 10.6 K/uL Final  . RBC 11/04/2017 4.43  4.40 - 5.90 MIL/uL Final  . Hemoglobin 11/04/2017 13.1  13.0 - 18.0 g/dL Final  . HCT 11/04/2017 38.9* 40.0 - 52.0 % Final  . MCV 11/04/2017 87.8  80.0 - 100.0 fL Final  . MCH 11/04/2017 29.6  26.0 - 34.0 pg Final  . MCHC 11/04/2017 33.7  32.0 - 36.0 g/dL Final  . RDW 11/04/2017 13.4  11.5 - 14.5 % Final  . Platelets 11/04/2017 244  150 - 440 K/uL Final   Performed at [facility], [address] [place], [place] [address]  . Creatinine, Ser 11/04/2017 0.93  0.61 - 1.24 mg/dL Final  . GFR calc non Af Amer  11/04/2017 >60  >60 mL/min Final  . GFR calc Af Amer 11/04/2017 >60  >60 mL/min Final   Comment: (NOTE) The eGFR has been calculated using the CKD EPI equation. This calculation has not been validated in all clinical situations. eGFR's persistently <60 mL/min signify possible Chronic Kidney Disease. Performed at [facility], [address], [place], [place] [address]   . WBC 11/05/2017 12.7* 3.8 - 10.6 K/uL Final  . RBC 11/05/2017 4.13* 4.40 - 5.90 MIL/uL Final  . Hemoglobin 11/05/2017 12.3* 13.0 - 18.0 g/dL Final  . HCT 11/05/2017 36.1* 40.0 - 52.0 % Final  . MCV 11/05/2017 87.4  80.0 - 100.0 fL Final  . MCH 11/05/2017 29.7  26.0 - 34.0 pg Final  . MCHC 11/05/2017 34.0  32.0 - 36.0 g/dL Final  . RDW 11/05/2017 13.2  11.5 - 14.5 % Final  . Platelets 11/05/2017 259  150 - 440 K/uL Final   Performed at [facility], [address], [place], [place] [address]  . Sodium 11/05/2017 131* 135 - 145 mmol/L Final  . Potassium 11/05/2017 4.4  3.5 - 5.1 mmol/L Final  . Chloride 11/05/2017 97* 101 - 111 mmol/L Final  . CO2 11/05/2017 25  22 - 32 mmol/L Final  . Glucose, Bld 11/05/2017 144* 65 - 99 mg/dL Final  . BUN 11/05/2017 10  6 - 20 mg/dL Final  . Creatinine, Ser 11/05/2017 0.99  0.61 - 1.24 mg/dL Final  . Calcium 11/05/2017 8.5* 8.9 - 10.3 mg/dL Final  . GFR calc non Af Amer 11/05/2017 >60  >60 mL/min Final  . GFR calc Af Amer 11/05/2017 >60  >60 mL/min Final   Comment: (NOTE) The eGFR has been calculated using the CKD EPI equation. This calculation has not been validated in all clinical situations. eGFR's persistently <60 mL/min signify possible Chronic Kidney Disease.   Georgiann Hahn gap 11/05/2017 9  5 - 15 Final   Performed at [facility], [address] [place], [place] [address]  . WBC 11/06/2017 10.9* 3.8 - 10.6 K/uL Final  . RBC 11/06/2017 3.96* 4.40 - 5.90 MIL/uL Final  . Hemoglobin 11/06/2017 11.9* 13.0 - 18.0 g/dL Final  . HCT 11/06/2017 34.8*  40.0 - 52.0 % Final  . MCV 11/06/2017 87.7  80.0 - 100.0 fL Final  . MCH  11/06/2017 30.0  26.0 - 34.0 pg Final  . MCHC 11/06/2017 34.2  32.0 - 36.0 g/dL Final  . RDW 11/06/2017 13.6  11.5 - 14.5 % Final  . Platelets 11/06/2017 230  150 - 440 K/uL Final   Performed at [facility], [address] [place], [place] [address]  . Sodium 11/06/2017 133* 135 - 145 mmol/L Final  . Potassium 11/06/2017 4.3  3.5 - 5.1 mmol/L Final  . Chloride 11/06/2017 97* 101 - 111 mmol/L Final  . CO2 11/06/2017 27  22 - 32 mmol/L Final  . Glucose, Bld 11/06/2017 143* 65 - 99 mg/dL Final  . BUN 11/06/2017 13  6 - 20 mg/dL Final  . Creatinine, Ser 11/06/2017 0.97  0.61 - 1.24 mg/dL Final  . Calcium 11/06/2017 8.6* 8.9 - 10.3 mg/dL Final  . GFR calc non Af Amer 11/06/2017 >60  >60 mL/min Final  . GFR calc Af Amer 11/06/2017 >60  >60 mL/min Final   Comment: (NOTE) The eGFR has been calculated using the CKD EPI equation. This calculation has not been validated in all clinical situations. eGFR's persistently <60 mL/min signify possible Chronic Kidney Disease.   Georgiann Hahn gap 11/06/2017 9  5 - 15 Final   Performed at [facility], [address] [place], [place] [address]  . Color, Urine 11/06/2017 YELLOW* YELLOW Final  . APPearance 11/06/2017 CLEAR* CLEAR Final  . Specific Gravity, Urine 11/06/2017 1.019  1.005 - 1.030 Final  . pH 11/06/2017 6.0  5.0 - 8.0 Final  . Glucose, UA 11/06/2017 NEGATIVE  NEGATIVE mg/dL Final  . Hgb urine dipstick 11/06/2017 SMALL* NEGATIVE Final  . Bilirubin Urine 11/06/2017 NEGATIVE  NEGATIVE Final  . Ketones, ur 11/06/2017 NEGATIVE  NEGATIVE mg/dL Final  . Protein, ur 11/06/2017 30* NEGATIVE mg/dL Final  . Nitrite 11/06/2017 NEGATIVE  NEGATIVE Final  . Leukocytes, UA 11/06/2017 NEGATIVE  NEGATIVE Final  . RBC / HPF 11/06/2017 TOO NUMEROUS TO COUNT  0 - 5 RBC/hpf Final  . WBC, UA 11/06/2017 6-30  0 - 5 WBC/hpf Final  . Bacteria, UA 11/06/2017 NONE SEEN  NONE  SEEN Final  . Squamous Epithelial / LPF 11/06/2017 NONE SEEN  NONE SEEN Final  . Mucus 11/06/2017 PRESENT   Final   Performed at [facility], [address], [place], [place] [address]  . TSH 11/06/2017 3.324  0.350 - 4.500 uIU/mL Final   Comment: Performed by a 3rd Generation assay with a functional sensitivity of <=0.01 uIU/mL. Performed at [facility], [address] [place], [place] [address]   . Specimen Description 11/06/2017 BLOOD LAC   Final  . Special Requests 11/06/2017 BOTTLES DRAWN AEROBIC AND ANAEROBIC Blood Culture adequate volume   Final  . Culture 11/06/2017    Final                   Value:NO GROWTH 5 DAYS Performed at [facility], [address] [place], [place] [address]   . Report Status 11/06/2017 11/11/2017 FINAL   Final  . Specimen Description 11/06/2017 BLOOD LEFT HAND   Final  . Special Requests 11/06/2017 BOTTLES DRAWN AEROBIC AND ANAEROBIC Blood Culture adequate volume   Final  . Culture 11/06/2017    Final                   Value:NO GROWTH 5 DAYS Performed at [facility], [address], [place], [place] [address]   . Report Status 11/06/2017 11/11/2017 FINAL  Final  . Troponin I 11/06/2017 <0.03  <0.03 ng/mL Final   Performed at [facility], [address] [place], [place] [address]  . Troponin I 11/06/2017 0.21* <0.03 ng/mL Final   Comment: CRITICAL RESULT CALLED TO, READ BACK BY AND VERIFIED WITH OLIVIA KIBERENGE @ 2216 ON 11/06/2017 BY CAF Performed at [facility] [address], [place], [place] [address]   . Troponin I 11/07/2017 <0.03  <0.03 ng/mL Final   Performed at [facility], [address] [place], [place] [address]  . Troponin I 11/07/2017 <0.03  <0.03 ng/mL Final   Performed at [facility], [address] [place], [place] [address]  . Troponin I 11/07/2017 <0.03  <0.03 ng/mL Final   Performed at [facility], [address]  [place], [place] [address]  . Weight 11/07/2017 3,440  oz Final  . Height 11/07/2017 67  in Final  . BP 11/07/2017 138/73  mmHg Final  . aPTT 11/07/2017 37* 24 - 36 seconds Final   Comment:        IF BASELINE aPTT IS ELEVATED, SUGGEST PATIENT RISK ASSESSMENT BE USED TO DETERMINE APPROPRIATE ANTICOAGULANT THERAPY. Performed at [facility], [address] [place], [place] [address]   . Prothrombin Time 11/07/2017 14.1  11.4 - 15.2 seconds Final  . INR 11/07/2017 1.10   Final   Performed at [facility], [address] [place], [place] [address]  . WBC 11/10/2017 8.3  3.8 - 10.6 K/uL Final  . RBC 11/10/2017 3.40* 4.40 - 5.90 MIL/uL Final  . Hemoglobin 11/10/2017 10.1* 13.0 - 18.0 g/dL Final  . HCT 11/10/2017 29.8* 40.0 - 52.0 % Final  . MCV 11/10/2017 87.8  80.0 - 100.0 fL Final  . MCH 11/10/2017 29.7  26.0 - 34.0 pg Final  . MCHC 11/10/2017 33.8  32.0 - 36.0 g/dL Final  . RDW 11/10/2017 13.3  11.5 - 14.5 % Final  . Platelets 11/10/2017 309  150 - 440 K/uL Final   Performed at [facility], [address], [place], [place] [address]  . Creatinine, Ser 11/10/2017 0.85  0.61 - 1.24 mg/dL Final  . GFR calc non Af Amer 11/10/2017 >60  >60 mL/min Final  . GFR calc Af Amer 11/10/2017 >60  >60 mL/min Final   Comment: (NOTE) The eGFR has been calculated using the CKD EPI equation. This calculation has not been validated in all clinical situations. eGFR's persistently <60 mL/min signify possible Chronic Kidney Disease. Performed at [facility], [address] [place],  [address]     Assessment:  Johnny Freeman is a 67 y.o. male with bilateral pulmonary emboli s/p left knee arthoplasty on 11/04/2017.  He is on Eliquis.  Chest CT angiogram on 11/06/2017 revealed acute pulmonary emboli within distal left lower lobe pulmonary artery with extension of thrombus into multiple segmental and subsegmental branches of the left lower lobe. Additional  emboli was visualized within right upper lobe segmental and subsegmental branch vessels with suspected small emboli in right lower lobe and left upper lobe subsegmental branches. Imaging was positive for acute PE with CT evidence of right heart strain (RV/LV ratio = 1.46) consistent with at least submassive (intermediate risk) PE.  He has a history of prostate cancer.  He was diagnosed in 2011 after presenting with an elevated PSA.  He is followed by Dr. Yves Dill.  He was treated in the [place] with HIFU procedure.  PSA was 0.1 in 03/2017.  Last colonoscopy  was within the past 10 years.  CBC on 11/10/2017 revealed a hematocrit of 29.8 and hemoglobin 10.1.  Creatinine ranged between 0.85 - 0.99.  Symptomatically, patient is doing well.  He denies any acute physical concerns. Patient has no claudication pain, shortness of breath, or chest pain. Exam is unremarkable.   Plan: 1.  Discuss diagnosis and management of pulmonary embolism. 2.  Labs today:  CBC with diff, CMP, ferritin, iron studies, factor V Leiden, prothrombin gene mutation, lupus anticoagulant panel, anticardiolipin antibodies, beta2-glycoprotein antibodies, PSA. 3.  Bilateral lower extremity duplex- baseline. 4.  Discuss anticoagulation. Patient will be anticoagulated for a minimum of 6 months. Hypercoagulable workup will be done today. A positive lupus anticoagulant will lead to further testing in 3 months. If that testing is possible, we discussed that patient will need to be on lifelong anticoagulation. Continue Eliquis 5 mg BID as previously prescribed. Refill sent in today for Eliquis 5 mg BID (Disp #[id number]).  5.  Discuss need to wear anti-embolism stocking during the day.  6.  RTC in 1 week for MD assessment, review of workup, and further discussion regarding direction of therapy.   Addendum:  Bilateral lower extremity duplex on 12/03/2017 revealed no evidence of DVT.   Honor Loh, NP  12/01/2017, 3:14 PM   I saw and evaluated the  patient, participating in the key portions of the service and reviewing pertinent diagnostic studies and records.  I reviewed the nurse practitioner's note and agree with the findings and the plan.  The assessment and plan were discussed with the patient.  Several questions were asked by the patient and answered.   Nolon Stalls, MD  12/01/2017, 3:14 PM

## 2017-12-02 LAB — CARDIOLIPIN ANTIBODIES, IGG, IGM, IGA

## 2017-12-02 LAB — BETA-2-GLYCOPROTEIN I ABS, IGG/M/A

## 2017-12-03 ENCOUNTER — Ambulatory Visit
Admission: RE | Admit: 2017-12-03 | Discharge: 2017-12-03 | Disposition: A | Discharge status: Home / Self Care | Payer: Managed Care, Other (non HMO) | Source: Ambulatory Visit | Attending: Urgent Care | Admitting: Urgent Care

## 2017-12-03 DIAGNOSIS — I2699 Other pulmonary embolism without acute cor pulmonale: Secondary | ICD-10-CM | POA: Diagnosis present

## 2017-12-03 DIAGNOSIS — Z86711 Personal history of pulmonary embolism: Secondary | ICD-10-CM | POA: Insufficient documentation

## 2017-12-03 DIAGNOSIS — Z9889 Other specified postprocedural states: Secondary | ICD-10-CM | POA: Insufficient documentation

## 2017-12-03 LAB — LUPUS ANTICOAGULANT PANEL
DRVVT: 48.2 s — AB (ref 0.0–47.0)
PTT LA: 34.1 s (ref 0.0–51.9)

## 2017-12-03 LAB — DRVVT MIX: DRVVT MIX: 42.9 s (ref 0.0–47.0)

## 2017-12-05 LAB — PROTHROMBIN GENE MUTATION

## 2017-12-05 LAB — FACTOR 5 LEIDEN

## 2017-12-07 DIAGNOSIS — D649 Anemia, unspecified: Secondary | ICD-10-CM | POA: Insufficient documentation

## 2017-12-08 ENCOUNTER — Encounter: Payer: Self-pay | Admitting: Hematology and Oncology

## 2017-12-08 ENCOUNTER — Inpatient Hospital Stay (HOSPITAL_BASED_OUTPATIENT_CLINIC_OR_DEPARTMENT_OTHER): Payer: Managed Care, Other (non HMO) | Admitting: Hematology and Oncology

## 2017-12-08 VITALS — BP 155/93 | HR 108 | Temp 98.8°F | Resp 20 | Wt 211.2 lb

## 2017-12-08 DIAGNOSIS — I2699 Other pulmonary embolism without acute cor pulmonale: Secondary | ICD-10-CM

## 2017-12-08 DIAGNOSIS — Z8546 Personal history of malignant neoplasm of prostate: Secondary | ICD-10-CM

## 2017-12-08 NOTE — Progress Notes (Signed)
[facility] day:  12/08/2017  Chief Complaint: Johnny Freeman is a 67 y.o. male with acute pulmonary embolism s/p left knee replacement who is seen for review of work-up and discussion regarding direction of therapy.  HPI:  The patient was last seen in the hematology clinic on 12/01/2017 for initial consultation.  He had developed a pulmonary embolism following left knee replacement.  He had a history of prostate cancer.  He was on Eliquis.  Labs revealed a hematocrit of 36.6, hemoglobin 12.4, MCV 86.7, platelets 324,000, WBC 6500 with an ANC of 3600.  Sodium was 130.  Ferritin was 113.  Iron saturation was 11% with a TIBC of 316.  Factor V Leiden and prothrombin gene mutation were negative.  Lupus anticoagulant panel was negative.  Anti-cardiolipin antibodies and beta-2 glycoprotein antibodies were normal.  PSA was 0.12.  During the interim, he has done well.  He denies any shortness of breath or increased leg swelling.  He states that he drinks a "lot of plain water' and "resticts salt".   Past Medical History:  Diagnosis Date  . Anxiety   . Cancer [facility])    prostate 2011  . GERD (gastroesophageal reflux disease)   . Headache    migraines    Past Surgical History:  Procedure Laterality Date  . CYSTOSCOPY N/A 11/04/2017   Procedure: CYSTOSCOPY;  Surgeon: Hessie Knows, MD;  Location: [facility] ORS;  Service: Orthopedics;  Laterality: N/A;  . ROTATOR CUFF REPAIR Right 2003  . TOTAL KNEE ARTHROPLASTY Left 11/04/2017   Procedure: TOTAL KNEE ARTHROPLASTY;  Surgeon: Hessie Knows, MD;  Location: [facility] ORS;  Service: Orthopedics;  Laterality: Left;    No family history on file.  Social History:  reports that he quit smoking about 45 years ago. he has never used smokeless tobacco. He reports that he does not drink alcohol or use drugs.  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  He lives in [place]. Patient had 2 children; one passed at age 41 from a  MI. The patient is accompanied by his wife, Johnny Freeman, today.  Allergies:  Allergies  Allergen Reactions  . Shellfish Allergy Swelling    Swelling in eyes    Current Medications: Current Outpatient Medications  Medication Sig Dispense Refill  . acetaminophen (TYLENOL) 500 MG tablet Take 500 mg by mouth 2 (two) times daily as needed (FOR PAIN.).    Marland Kitchen apixaban (ELIQUIS) 5 MG TABS tablet Take 1 tablet (5 mg total) by mouth 2 (two) times daily. 180 tablet 0  . Ascorbic Acid (VITAMIN C PO) Take 1 tablet by mouth daily.    Marland Kitchen aspirin-acetaminophen-caffeine (EXCEDRIN MIGRAINE) 250-250-65 MG tablet Take 2 tablets by mouth every 6 (six) hours as needed for migraine.    . Cholecalciferol (VITAMIN D-3 PO) Take 1 tablet by mouth daily with lunch.    . diltiazem (CARDIZEM CD) 120 MG 24 hr capsule Take 120 mg by mouth daily.    Marland Kitchen docusate sodium (COLACE) 100 MG capsule Take 100 mg by mouth daily.    . methocarbamol (ROBAXIN) 500 MG tablet Take 500 mg by mouth as needed.    . Multiple Vitamin (MULTIVITAMIN WITH MINERALS) TABS tablet Take 1 tablet by mouth daily.    . Omega-3 Fatty Acids (FISH OIL PO) Take 2 capsules by mouth daily with lunch.    Marland Kitchen omeprazole (PRILOSEC) 20 MG capsule Take 20 mg by mouth daily before supper. Take 1 capsule (20 mg) by mouth 30  minutes before supper  0  . oxyCODONE (OXY IR/ROXICODONE) 5 MG immediate release tablet Take 1 tablet (5 mg total) by mouth every 4 (four) hours as needed for moderate pain ((score 4 to 6)). 40 tablet 0   No current facility-administered medications for this visit.     Review of Systems:  GENERAL:  Feels good.  No fevers or sweats.  Weight up 1 pound since last appointment. PERFORMANCE STATUS (ECOG):  1 HEENT:  No visual changes, runny nose, sore throat, mouth sores or tenderness. Lungs: No shortness of breath.  Slight cough.  No hemoptysis. Cardiac:  No chest pain, palpitations, orthopnea, or PND. GI:  Constipation.  No nausea, vomiting, diarrhea,  melena or hematochezia. Colonoscopy within the past 10 years. GU:  No urgency, frequency, dysuria, or hematuria. Musculoskeletal:  No back pain.  s/p left knee replacement.  Needs right knee done.  No muscle tenderness. Extremities:  Little swelling in left leg.  No pain. Skin:  No rashes or skin changes. Neuro:  No headache, numbness or weakness, balance or coordination issues. Endocrine:  No diabetes, thyroid issues, hot flashes or night sweats. Psych:  No mood changes, depression or anxiety. Pain:  No focal pain. Review of systems:  All other systems reviewed and found to be negative.  Physical Exam: There were no vitals taken for this visit. GENERAL:  Well developed, well nourished, gentleman sitting comfortably in the exam room in no acute distress.  He has a cane at his side. MENTAL STATUS:  Alert and oriented to person, place and time. HEAD:  White hair with beard.  Normocephalic, atraumatic, face symmetric, no Cushingoid features. EYES:  Glasses.  Brown eyes.  Pupils equal round and reactive to light and accomodation.  No conjunctivitis or scleral icterus. ENT:  Oropharynx clear without lesion.  Tongue normal. Mucous membranes moist.  RESPIRATORY:  Clear to auscultation without rales, wheezes or rhonchi. CARDIOVASCULAR:  Regular rate and rhythm without murmur, rub or gallop. ABDOMEN:  Soft, non-tender, with active bowel sounds, and no hepatosplenomegaly.  No masses. SKIN:  No rashes, ulcers or lesions. EXTREMITIES: Subtle left lower extremity edema.  No skin discoloration or tenderness.  No palpable cords. LYMPH NODES: No palpable cervical, supraclavicular, axillary or inguinal adenopathy  NEUROLOGICAL: Unremarkable. PSYCH:  Appropriate.   No visits with results within 3 Day(s) from this visit.  Latest known visit with results is:  Office Visit on 12/01/2017  Component Date Value Ref Range Status  . PTT Lupus Anticoagulant 12/01/2017 34.1  0.0 - 51.9 sec Final  . DRVVT  12/01/2017 48.2* 0.0 - 47.0 sec Final  . Lupus Anticoag Interp 12/01/2017 Comment:   Corrected   Comment: (NOTE) No lupus anticoagulant was detected. An extended dRVVT that corrects on mixing with normal plasma can be caused by a deficiency of one of the common pathway factors (X, V, II or fibrinogen). Performed At: [facility] [place], [place] [id number] Rush Farmer MD AS:[id number] Performed at [facility], [address], [place], [place] [address]   . Prostatic Specific Antigen 12/01/2017 0.12  0.00 - 4.00 ng/mL Final   Comment: (NOTE) While PSA levels of <=4.0 ng/ml are reported as reference range, some men with levels below 4.0 ng/ml can have prostate cancer and many men with PSA above 4.0 ng/ml do not have prostate cancer.  Other tests such as free PSA, age specific reference ranges, PSA velocity and PSA doubling time may be helpful especially in men less than 60 years  old. Performed at [facility], [place] [address], [place], [place] [address]   . Beta-2 Glyco I IgG 12/01/2017 <9  0 - 20 GPI IgG units Final   Comment: (NOTE) The reference interval reflects a 3SD or 99th percentile interval, which is thought to represent a potentially clinically significant result in accordance with the International Consensus Statement on the classification criteria for definitive antiphospholipid syndrome (APS). J Thromb Haem 2006;4:295-306.   . Beta-2-Glycoprotein I IgM 12/01/2017 <9  0 - 32 GPI IgM units Final   Comment: (NOTE) The reference interval reflects a 3SD or 99th percentile interval, which is thought to represent a potentially clinically significant result in accordance with the International Consensus Statement on the classification criteria for definitive antiphospholipid syndrome (APS). J Thromb Haem 2006;4:295-306. Performed At: [facility] [place], [place] [id number] Rush Farmer MD QI:[id number]   .  Beta-2-Glycoprotein I IgA 12/01/2017 <9  0 - 25 GPI IgA units Final   Comment: (NOTE) The reference interval reflects a 3SD or 99th percentile interval, which is thought to represent a potentially clinically significant result in accordance with the International Consensus Statement on the classification criteria for definitive antiphospholipid syndrome (APS). J Thromb Haem 2006;4:295-306. Performed at [facility], [address] [place], [place] [address]   . Anticardiolipin IgG 12/01/2017 <9  0 - 14 GPL U/mL Final   Comment: (NOTE)                          Negative:              <15                          Indeterminate:     15 - 20                          Low-Med Positive: >20 - 80                          High Positive:         >80   . Anticardiolipin IgM 12/01/2017 <9  0 - 12 MPL U/mL Final   Comment: (NOTE)                          Negative:              <13                          Indeterminate:     13 - 20                          Low-Med Positive: >20 - 80                          High Positive:         >80   . Anticardiolipin IgA 12/01/2017 <9  0 - 11 APL U/mL Final   Comment: (NOTE)                          Negative:              <12  Indeterminate:     12 - 20                          Low-Med Positive: >20 - 80                          High Positive:         >80 Performed At: [facility] [place], [place] [id number] Rush Farmer MD PV:[id number] Performed at [facility], [address] [place], [place] [address]   . Recommendations-PTGENE: 12/01/2017 Comment   Final   Comment: (NOTE) NEGATIVE No mutation identified. Comment: A point mutation (G20210A) in the factor II (prothrombin) gene is the second most common cause of inherited thrombophilia. The incidence of this mutation in the [place] Caucasian population is about 2% and in the [place] American population it is approximately 0.5%. This  mutation is rare in the [place] and Native American population. Being heterozygous for a prothrombin mutation increases the risk for developing venous thrombosis about 2 to 3 times above the general population risk. Being homozygous for the prothrombin gene mutation increases the relative risk for venous thrombosis further, although it is not yet known how much further the risk is increased. In women heterozygous for the prothrombin gene mutation, the use of estrogen containing oral contraceptives increases the relative risk of venous thrombosis about 16 times and the risk of developing cerebral thrombosis is also significantly increased. In pregnancy the pr                          othrombin gene mutation increases risk for venous thrombosis and may increase risk for stillbirth, placental abruption, pre-eclampsia and fetal growth restriction. If the patient possesses two or more congenital or acquired thrombophilic risk factors, the risk for thrombosis may rise to more than the sum of the risk ratios for the individual mutations. This assay detects only the prothrombin G20210A mutation and does not measure genetic abnormalities elsewhere in the genome. Other thrombotic risk factors may be pursued through systematic clinical laboratory analysis. These factors include the R506Q (Leiden) mutation in the Factor V gene, plasma homocysteine levels, as well as testing for deficiencies of antithrombin III, protein C and protein S. Genetic Counselors are available for health care providers to discuss results at [phone]-GENE [phone]). Methodology: DNA analysis of the Factor II gene was performed by PCR amplification followed by restriction analysis. The di                          agnostic sensitivity is >99% for both. All the tests must be combined with clinical information for the most accurate interpretation. Molecular-based testing is highly accurate, but as in any laboratory test,  diagnostic errors may occur. This test was developed and its performance characteristics determined by [facility]. It has not been cleared or approved by the Food and Drug Administration. Poort SR, et al. Blood. 1996; 07:1219-7588. Varga EA. Circulation. 2004; 325:Q98-Y64. Mervin Hack, et [place]; 19:700-703. Allison Quarry, PhD, [facility] Ruben Reason, PhD, [facility] Annetta Maw, M.S., PhD, [facility] Alfredo Bach, PhD, [facility] Norva Riffle, PhD, [facility] Earlean Polka, PhD, [facility] Performed At: [facility] [address] [place], [place] [id number] Nechama Guard MD [id number]:[id number] Performed at [facility], [address] [place], [place] [address]   .  Recommendations-F5LEID: 12/01/2017 Comment   Final   Comment: (NOTE) Result:  Negative (no mutation found) Factor V Leiden is a specific mutation (R506Q) in the factor V gene that is associated with an increased risk of venous thrombosis. Factor V Leiden is more resistant to inactivation by activated protein C.  As a result, factor V persists in the circulation leading to a mild hyper- coagulable state.  The Leiden mutation accounts for 90% - 95% of APC resistance.  Factor V Leiden has been reported in patients with deep vein thrombosis, pulmonary embolus, central retinal vein occlusion, cerebral sinus thrombosis and hepatic vein thrombosis. Other risk factors to be considered in the workup for venous thrombosis include the G20210A mutation in the factor II (prothrombin) gene, protein S and C deficiency, and antithrombin deficiencies. Anticardiolipin antibody and lupus anticoagulant analysis may be appropriate for certain patients, as well as homocysteine levels. Contact your local [facility] for information on how to order additi                          onal testing if desired. **Genetic counselors are available for health care providers to**  discuss results at [phone]-GENE  [phone]). Methodology: DNA analysis of the Factor V gene was performed by allele-specific PCR. The diagnostic sensitivity and specificity is >99% for both. Molecular-based testing is highly accurate, but as in any laboratory test, diagnostic errors may occur. All test results must be combined with clinical information for the most accurate interpretation. This test was developed and its performance characteristics determined by [facility]. It has not been cleared or approved by the Food and Drug Administration. References: Voelkerding K (1996).  Clin Lab Med [phone]. Allison Quarry, PhD, [facility] Ruben Reason, PhD, [facility] Annetta Maw, M.S., PhD, [facility] Alfredo Bach, PhD, [facility] Norva Riffle, PhD, [facility] Earlean Polka PhD, [facility] Performed At: [facility] [address] [place], [place] [id number] Nechama Guard MD [id number]                          0 Performed at [facility], [address] [place], [place] [address]   . Iron 12/01/2017 35* 45 - 182 ug/dL Final  . TIBC 12/01/2017 316  250 - 450 ug/dL Final  . Saturation Ratios 12/01/2017 11* 17.9 - 39.5 % Final  . UIBC 12/01/2017 281  ug/dL Final   Performed at [facility], [address] [place], [place] [address]  . Ferritin 12/01/2017 113  24 - 336 ng/mL Final   Performed at [facility], [address] [place], [place] [address]  . Sodium 12/01/2017 130* 135 - 145 mmol/L Final  . Potassium 12/01/2017 4.3  3.5 - 5.1 mmol/L Final  . Chloride 12/01/2017 98* 101 - 111 mmol/L Final  . CO2 12/01/2017 26  22 - 32 mmol/L Final  . Glucose, Bld 12/01/2017 102* 65 - 99 mg/dL Final  . BUN 12/01/2017 13  6 - 20 mg/dL Final  . Creatinine, Ser 12/01/2017 0.88  0.61 - 1.24 mg/dL Final  . Calcium 12/01/2017 8.9  8.9 - 10.3 mg/dL Final  . Total Protein 12/01/2017 7.3  6.5 - 8.1 g/dL Final  . Albumin 12/01/2017 3.9  3.5 - 5.0 g/dL Final  . AST 12/01/2017 21  15 - 41 U/L Final  . ALT  12/01/2017 22  17 - 63 U/L Final  . Alkaline Phosphatase 12/01/2017 86  38 - 126 U/L Final  .  Total Bilirubin 12/01/2017 0.5  0.3 - 1.2 mg/dL Final  . GFR calc non Af Amer 12/01/2017 >60  >60 mL/min Final  . GFR calc Af Amer 12/01/2017 >60  >60 mL/min Final   Comment: (NOTE) The eGFR has been calculated using the CKD EPI equation. This calculation has not been validated in all clinical situations. eGFR's persistently <60 mL/min signify possible Chronic Kidney Disease.   Georgiann Hahn gap 12/01/2017 6  5 - 15 Final   Performed at [facility], [address] [place], [place] [address]  . WBC 12/01/2017 6.5  3.8 - 10.6 K/uL Final  . RBC 12/01/2017 4.23* 4.40 - 5.90 MIL/uL Final  . Hemoglobin 12/01/2017 12.4* 13.0 - 18.0 g/dL Final  . HCT 12/01/2017 36.6* 40.0 - 52.0 % Final  . MCV 12/01/2017 86.7  80.0 - 100.0 fL Final  . MCH 12/01/2017 29.4  26.0 - 34.0 pg Final  . MCHC 12/01/2017 34.0  32.0 - 36.0 g/dL Final  . RDW 12/01/2017 13.9  11.5 - 14.5 % Final  . Platelets 12/01/2017 324  150 - 440 K/uL Final  . Neutrophils Relative % 12/01/2017 55  % Final  . Neutro Abs 12/01/2017 3.6  1.4 - 6.5 K/uL Final  . Lymphocytes Relative 12/01/2017 30  % Final  . Lymphs Abs 12/01/2017 1.9  1.0 - 3.6 K/uL Final  . Monocytes Relative 12/01/2017 13  % Final  . Monocytes Absolute 12/01/2017 0.8  0.2 - 1.0 K/uL Final  . Eosinophils Relative 12/01/2017 1  % Final  . Eosinophils Absolute 12/01/2017 0.1  0 - 0.7 K/uL Final  . Basophils Relative 12/01/2017 1  % Final  . Basophils Absolute 12/01/2017 0.1  0 - 0.1 K/uL Final   Performed at [facility], [address] [place], [place] [address]  . dRVVT Mix 12/01/2017 42.9  0.0 - 47.0 sec Final   Comment: (NOTE) Performed At: [facility] [place], [place] [id number] Rush Farmer MD [id number] Performed at [facility], [address] [place], [place] [address]     Assessment:  Johnny Freeman is a 67  y.o. male with bilateral pulmonary emboli s/p left knee arthoplasty on 11/04/2017.  He is on Eliquis.  Chest CT angiogram on 11/06/2017 revealed acute pulmonary emboli within distal left lower lobe pulmonary artery with extension of thrombus into multiple segmental and subsegmental branches of the left lower lobe. Additional emboli was visualized within right upper lobe segmental and subsegmental branch vessels with suspected small emboli in right lower lobe and left upper lobe subsegmental branches. Imaging was positive for acute PE with CT evidence of right heart strain (RV/LV ratio = 1.46) consistent with at least submassive (intermediate risk) PE.  Bilateral lower extremity duplex on 12/03/2017 revealed no evidence of DVT.  Work-up on 12/01/2017 revealed the following normal studies:  Factor V Leiden, prothrombin gene mutation, lupus anticoagulant panel, anti-cardiolipin antibodies and beta-2 glycoprotein antibodies.  He has a history of prostate cancer.  He was diagnosed in 2011 after presenting with an elevated PSA.  He is followed by Dr. Yves Dill.  He was treated in the [place] with HIFU procedure.  PSA was 0.1 in 03/2017 and 0.12 on 12/01/2017.  Last colonoscopy was within the past 10 years.  CBC on 11/10/2017 revealed a hematocrit of 29.8 and hemoglobin 10.1.  Creatinine ranged between 0.85 - 0.99.  CBC on 12/01/2017 revealed a hematocrit was 36.6, hemoglobin 12.4, MCV 86.7, platelets 324,000, WBC 6500 with an ANC of  3600 on 12/01/2017.  Sodium was 130.  Ferritin was 113 with an iron saturation of 11% with a TIBC of 316.    Symptomatically, patient is doing well.  He denies any acute physical concerns. Patient has no claudication pain, shortness of breath, or chest pain. Exam is unremarkable.   Plan: 1.  Discuss hypercoagulable work-up: negative to date.  PSA remains normal.  Hemoglobin is improving post surgery. 2.  Discuss hyponatremia.  Patient drinking "a lot of plain water and limited  salt".  Discuss fluids with electrolytes.  Follow-up with Dr. Edwina Barth. 3.  Discuss lower extremity duplex- no evidence of DVT. 4.  Continue Eliquis. 5.  RTC in 3 months for MD assessment and labs (CBC with diff, CMP, protein C, protein S, and ATIII).   Lequita Asal, MD  12/08/2017, 4:35 PM

## 2017-12-08 NOTE — Progress Notes (Signed)
Patient offers no complaints today.  States he only takes Oxycodone on days he is having physical therapy.  Patient's BP elevated 155/93 HR 108.  States he is anxious about today's visit.

## 2018-01-23 ENCOUNTER — Ambulatory Visit: Payer: Self-pay | Admitting: Family Medicine

## 2018-01-23 VITALS — BP 159/85 | HR 94 | Resp 20 | Ht 69.0 in | Wt 216.0 lb

## 2018-01-23 DIAGNOSIS — Z008 Encounter for other general examination: Secondary | ICD-10-CM

## 2018-01-23 DIAGNOSIS — Z0189 Encounter for other specified special examinations: Principal | ICD-10-CM

## 2018-01-23 LAB — GLUCOSE, POCT (MANUAL RESULT ENTRY): POC Glucose: 106 mg/dl — AB (ref 70–99)

## 2018-01-23 NOTE — Progress Notes (Signed)
Waist- 38in.

## 2018-01-23 NOTE — Progress Notes (Signed)
Subjective: Annual biometrics screening labs Patient presents for his annual biometric screening labs only, patient has an appt scheduled with his PCP on 5/1 for his annual physical exam and wellness visit. Patient reports a history of hypertension and postoperative pulmonary embolus.  Patient reports recent left knee replacement in February.  Patient reports "stress-induced tachycardia" at that time that has resolved.  Denies any symptoms currently. PCP: Dr. Wynetta Emery  Patient works for maintenance. Patient denies any other issues or concerns.   Review of Systems Unremarkable  Objective  Physical Exam General: Awake, alert and oriented. No acute distress. Well developed, hydrated and nourished. Appears stated age.  Cardiac: Heart rate and rhythm are normal. No murmurs, gallops, or rubs are auscultated.   Assessment Annual biometrics screening labs  Plan  Lipid panel pending. Encouraged routine visits with primary care provider.  Full physical exam coming up with primary care provider on 5/1 so did not duplicate this today. Fasting blood sugar is 106 today.  Patient denies any history of this in the past.  Advised patient to follow-up with his primary care provider at his scheduled visit on 5/1 for this.  Discussed impaired fasting glucose. Patient's blood pressure is elevated at 159/85 today.  Patient reports this is because he did not take his blood pressure medication yet today, was not sure if he could take this when he was fasting.  Informed patient he could take this.

## 2018-01-24 LAB — LIPID PANEL
Chol/HDL Ratio: 4.6 ratio (ref 0.0–5.0)
Cholesterol, Total: 142 mg/dL (ref 100–199)
HDL: 31 mg/dL — ABNORMAL LOW (ref >39)
LDL Calculated: 89 mg/dL (ref 0–99)
Triglycerides: 111 mg/dL (ref 0–149)
VLDL Cholesterol Cal: 22 mg/dL (ref 5–40)

## 2018-01-26 NOTE — Progress Notes (Signed)
Larene Beach, Will you call the patient and inform them that their lipid panel came back?  Everything is normal with the exception of his HDL cholesterol, which is slightly decreased.  The HDL cholesterol ("good cholesterol") is 31, normal values are above 39. Please advise the patient to discuss this with their primary care provider at their next visit.

## 2018-02-20 ENCOUNTER — Other Ambulatory Visit
Admission: EM | Admit: 2018-02-20 | Discharge: 2018-02-20 | Disposition: A | Discharge status: Home / Self Care | Payer: Worker's Compensation | Attending: Family Medicine | Admitting: Family Medicine
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
## 2018-03-02 ENCOUNTER — Other Ambulatory Visit: Payer: Self-pay

## 2018-03-03 ENCOUNTER — Inpatient Hospital Stay: Payer: Managed Care, Other (non HMO) | Attending: Hematology and Oncology

## 2018-03-03 ENCOUNTER — Other Ambulatory Visit: Payer: Self-pay

## 2018-03-03 DIAGNOSIS — Z96652 Presence of left artificial knee joint: Secondary | ICD-10-CM | POA: Diagnosis not present

## 2018-03-03 DIAGNOSIS — Z87891 Personal history of nicotine dependence: Secondary | ICD-10-CM | POA: Diagnosis not present

## 2018-03-03 DIAGNOSIS — I2699 Other pulmonary embolism without acute cor pulmonale: Secondary | ICD-10-CM | POA: Insufficient documentation

## 2018-03-03 DIAGNOSIS — Z7901 Long term (current) use of anticoagulants: Secondary | ICD-10-CM | POA: Diagnosis not present

## 2018-03-03 LAB — CBC WITH DIFFERENTIAL/PLATELET
Basophils Absolute: 0.1 10*3/uL (ref 0–0.1)
Basophils Relative: 1 %
Eosinophils Absolute: 0.1 10*3/uL (ref 0–0.7)
Eosinophils Relative: 1 %
HCT: 39.8 % — ABNORMAL LOW (ref 40.0–52.0)
Hemoglobin: 13.7 g/dL (ref 13.0–18.0)
Lymphocytes Relative: 22 %
Lymphs Abs: 1.5 10*3/uL (ref 1.0–3.6)
MCH: 28.4 pg (ref 26.0–34.0)
MCHC: 34.3 g/dL (ref 32.0–36.0)
MCV: 82.8 fL (ref 80.0–100.0)
Monocytes Absolute: 0.7 10*3/uL (ref 0.2–1.0)
Monocytes Relative: 11 %
Neutro Abs: 4.3 10*3/uL (ref 1.4–6.5)
Neutrophils Relative %: 65 %
Platelets: 302 10*3/uL (ref 150–440)
RBC: 4.8 MIL/uL (ref 4.40–5.90)
RDW: 13.9 % (ref 11.5–14.5)
WBC: 6.7 10*3/uL (ref 3.8–10.6)

## 2018-03-03 LAB — ANTITHROMBIN III: AntiThromb III Func: 111 % (ref 75–120)

## 2018-03-05 LAB — PROTEIN S ACTIVITY: Protein S Activity: 82 % (ref 63–140)

## 2018-03-05 LAB — PROTEIN C ACTIVITY: Protein C Activity: 105 % (ref 73–180)

## 2018-03-05 LAB — PROTEIN S, TOTAL: Protein S Ag, Total: 101 % (ref 60–150)

## 2018-03-06 LAB — PROTEIN C, TOTAL: Protein C, Total: 97 % (ref 60–150)

## 2018-03-09 ENCOUNTER — Other Ambulatory Visit: Payer: Self-pay

## 2018-03-09 ENCOUNTER — Ambulatory Visit: Payer: Self-pay | Admitting: Hematology and Oncology

## 2018-03-10 ENCOUNTER — Encounter: Payer: Self-pay | Admitting: Hematology and Oncology

## 2018-03-10 ENCOUNTER — Inpatient Hospital Stay (HOSPITAL_BASED_OUTPATIENT_CLINIC_OR_DEPARTMENT_OTHER): Payer: Managed Care, Other (non HMO) | Admitting: Hematology and Oncology

## 2018-03-10 VITALS — BP 154/83 | HR 107 | Temp 98.1°F | Resp 16 | Wt 217.0 lb

## 2018-03-10 DIAGNOSIS — I2699 Other pulmonary embolism without acute cor pulmonale: Secondary | ICD-10-CM | POA: Diagnosis not present

## 2018-03-10 DIAGNOSIS — Z7901 Long term (current) use of anticoagulants: Secondary | ICD-10-CM | POA: Diagnosis not present

## 2018-03-10 DIAGNOSIS — Z87891 Personal history of nicotine dependence: Secondary | ICD-10-CM

## 2018-03-10 DIAGNOSIS — Z96652 Presence of left artificial knee joint: Secondary | ICD-10-CM

## 2018-03-10 MED ORDER — APIXABAN 5 MG PO TABS: 5.0000 mg | ORAL_TABLET | Freq: Two times a day (BID) | ORAL | 0 refills | Status: DC

## 2018-03-10 NOTE — Progress Notes (Signed)
[facility] day:  03/10/2018  Chief Complaint: Johnny Freeman is a 67 y.o. male with acute pulmonary embolism s/p left knee replacement who is seen for review of work-up and discussion regarding direction of therapy.  HPI:  The patient was last seen in the hematology clinic on 12/08/2017.  At that time, he was doing well.  He denied any acute physical concerns. Patient had no claudication pain, shortness of breath, or chest pain.  Exam was unremarkable. He continued Eliquis.  Labs on 03/03/2018 revealed a hematocrit of 39.8, hemoglobin 13.7, MCV 82.8, platelets 302,000, WBC 6700 with an ANC of 4300.  Normal studies included:  protein S antigen (101%), protein S activity (82%), protein C total (97%), protein C activity (105%), ATIII (111%),   During the interim, patient is doing well today, and does not express any acute concerns. Patient denies B symptoms. He has not experienced any significant interval infections. Patient denies chest pain, shortness of breath, and palpitations. No claudication pain in his LEFT lower extremity. He continues on apixaban as prescribed. Patient denies bruising or bleeding. Patient is anticipating having his RIGHT knee done when patient off of current anticoagulation therapy.   Patient notes that he is eating well. Weight has gained 6 pounds.   Patient denies pain in the clinic today.    Past Medical History:  Diagnosis Date  . Anxiety   . Cancer [facility])    prostate 2011  . GERD (gastroesophageal reflux disease)   . Headache    migraines    Past Surgical History:  Procedure Laterality Date  . CYSTOSCOPY N/A 11/04/2017   Procedure: CYSTOSCOPY;  Surgeon: Hessie Knows, MD;  Location: [facility] ORS;  Service: Orthopedics;  Laterality: N/A;  . ROTATOR CUFF REPAIR Right 2003  . TOTAL KNEE ARTHROPLASTY Left 11/04/2017   Procedure: TOTAL KNEE ARTHROPLASTY;  Surgeon: Hessie Knows, MD;  Location: [facility] ORS;  Service:  Orthopedics;  Laterality: Left;    History reviewed. No pertinent family history.  Social History:  reports that he quit smoking about 45 years ago. He has never used smokeless tobacco. He reports that he does not drink alcohol or use drugs.  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  He lives in [place]. Patient had 2 children; one passed at age 62 from a MI. The patient is accompanied by his wife, Johnny Freeman, today.  Allergies:  Allergies  Allergen Reactions  . Shellfish Allergy Swelling    Swelling in eyes    Current Medications: Current Outpatient Medications  Medication Sig Dispense Refill  . acetaminophen (TYLENOL) 500 MG tablet Take 500 mg by mouth 2 (two) times daily as needed (FOR PAIN.).    Marland Kitchen apixaban (ELIQUIS) 5 MG TABS tablet Take 1 tablet (5 mg total) by mouth 2 (two) times daily. 180 tablet 0  . Cholecalciferol (VITAMIN D-3 PO) Take 1 tablet by mouth daily with lunch.    . diltiazem (CARDIZEM CD) 120 MG 24 hr capsule Take 120 mg by mouth daily.    Marland Kitchen docusate sodium (COLACE) 100 MG capsule Take 100 mg by mouth daily.    . Multiple Vitamin (MULTIVITAMIN WITH MINERALS) TABS tablet Take 1 tablet by mouth daily.    Marland Kitchen omeprazole (PRILOSEC) 20 MG capsule Take 20 mg by mouth daily before supper. Take 1 capsule (20 mg) by mouth 30 minutes before supper  0  . Probiotic Product (PROBIOTIC-10 PO) Take 1 tablet by mouth daily.     No current facility-administered  medications for this visit.     Review of Systems  Constitutional: Negative for diaphoresis, fever, malaise/fatigue and weight loss.       "I feel good"  HENT: Negative.   Eyes: Negative.   Respiratory: Negative for cough, hemoptysis, sputum production and shortness of breath.   Cardiovascular: Negative for chest pain, palpitations, orthopnea, leg swelling and PND.  Gastrointestinal: Negative for abdominal pain, blood in stool, constipation, diarrhea, melena, nausea and vomiting.  Genitourinary: Negative for dysuria,  frequency, hematuria and urgency.  Musculoskeletal: Positive for joint pain (s/p left knee replacement.  Needs right knee done.). Negative for back pain, falls and myalgias.  Skin: Negative for itching and rash.  Neurological: Negative for dizziness, tremors, weakness and headaches.  Endo/Heme/Allergies: Does not bruise/bleed easily.       Anticoagulation therapy - on apixaban  Psychiatric/Behavioral: Negative for depression, memory loss and suicidal ideas. The patient is not nervous/anxious and does not have insomnia.   All other systems reviewed and are negative.  Performance status (ECOG): 1 - Symptomatic but completely ambulatory  Physical Exam: Blood pressure (!) 154/83, pulse (!) 107, temperature 98.1 F (36.7 C), temperature source Tympanic, resp. rate 16, weight 217 lb (98.4 kg). GENERAL:  Well developed, well nourished, gentleman sitting comfortably in the exam room in no acute distress. MENTAL STATUS:  Alert and oriented to person, place and time. HEAD:  White hair and beard.  Normocephalic, atraumatic, face symmetric, no Cushingoid features. EYES:  Glasses.  Brown eyes.  Pupils equal round and reactive to light and accomodation.  No conjunctivitis or scleral icterus. ENT:  Oropharynx clear without lesion.  Tongue normal. Mucous membranes moist.  RESPIRATORY:  Clear to auscultation without rales, wheezes or rhonchi. CARDIOVASCULAR:  Regular rate and rhythm without murmur, rub or gallop. ABDOMEN:  Soft, non-tender, with active bowel sounds, and no hepatosplenomegaly.  No masses. SKIN:  Well healed left knee incision.  No rashes, ulcers or lesions. EXTREMITIES: Subtle left lower extremity edema.  No skin discoloration or tenderness.  No palpable cords. LYMPH NODES: No palpable cervical, supraclavicular, axillary or inguinal adenopathy  NEUROLOGICAL: Unremarkable. PSYCH:  Appropriate.    No visits with results within 3 Day(s) from this visit.  Latest known visit with results is:   Orders Only on 03/03/2018  Component Date Value Ref Range Status  . AntiThromb III Func 03/03/2018 111  75 - 120 % Final   Performed at [facility] [address], [place], [place] [address]  . Protein S Ag, Total 03/03/2018 101  60 - 150 % Final   Comment: (NOTE) This test was developed and its performance characteristics determined by [facility]. It has not been cleared or approved by the Food and Drug Administration. Performed At: [facility] [place], [place] [id number] Rush Farmer MD [id number] Performed at [facility], [address] [place], [place] [address]   . Protein S Activity 03/03/2018 82  63 - 140 % Final   Comment: (NOTE) Protein S activity may be falsely increased (masking an abnormal, low result) in patients receiving direct Xa inhibitor (e.g., rivaroxaban, apixaban, edoxaban) or a direct thrombin inhibitor (e.g., dabigatran) anticoagulant treatment due to assay interference by these drugs. Performed At: [facility] [place], [place] [id number] Rush Farmer MD [id number] Performed at [facility], [address] [place], [place] [address]   . Protein C, Total 03/03/2018 97  60 - 150 % Final   Comment: (NOTE) Performed At: [facility]  [place], [place] [id number] Rush Farmer MD [id number] Performed at [facility], [address], [place], [place] [address]   . Protein C Activity 03/03/2018 105  73 - 180 % Final   Comment: (NOTE) Performed At: [facility] [place], [place] [id number] Rush Farmer MD [id number] Performed at [facility], [address], [place], [place] [address]   . WBC 03/03/2018 6.7  3.8 - 10.6 K/uL Final  . RBC 03/03/2018 4.80  4.40 - 5.90 MIL/uL Final  . Hemoglobin 03/03/2018 13.7  13.0 - 18.0 g/dL Final  . HCT 03/03/2018 39.8* 40.0 - 52.0 % Final  . MCV 03/03/2018 82.8  80.0 -  100.0 fL Final  . MCH 03/03/2018 28.4  26.0 - 34.0 pg Final  . MCHC 03/03/2018 34.3  32.0 - 36.0 g/dL Final  . RDW 03/03/2018 13.9  11.5 - 14.5 % Final  . Platelets 03/03/2018 302  150 - 440 K/uL Final  . Neutrophils Relative % 03/03/2018 65  % Final  . Neutro Abs 03/03/2018 4.3  1.4 - 6.5 K/uL Final  . Lymphocytes Relative 03/03/2018 22  % Final  . Lymphs Abs 03/03/2018 1.5  1.0 - 3.6 K/uL Final  . Monocytes Relative 03/03/2018 11  % Final  . Monocytes Absolute 03/03/2018 0.7  0.2 - 1.0 K/uL Final  . Eosinophils Relative 03/03/2018 1  % Final  . Eosinophils Absolute 03/03/2018 0.1  0 - 0.7 K/uL Final  . Basophils Relative 03/03/2018 1  % Final  . Basophils Absolute 03/03/2018 0.1  0 - 0.1 K/uL Final   Performed at [facility], [address] [place], [place] [address]    Assessment:  ACIE CUSTIS is a 67 y.o. male with bilateral pulmonary emboli s/p left knee arthoplasty on 11/04/2017.  He is on Eliquis.  Chest CT angiogram on 11/06/2017 revealed acute pulmonary emboli within distal left lower lobe pulmonary artery with extension of thrombus into multiple segmental and subsegmental branches of the left lower lobe. Additional emboli was visualized within right upper lobe segmental and subsegmental branch vessels with suspected small emboli in right lower lobe and left upper lobe subsegmental branches. Imaging was positive for acute PE with CT evidence of right heart strain (RV/LV ratio = 1.46) consistent with at least submassive (intermediate risk) PE.  Bilateral lower extremity duplex on 12/03/2017 revealed no evidence of DVT.  Work-up on 12/01/2017 revealed the following normal studies:  Factor V Leiden, prothrombin gene mutation, lupus anticoagulant panel, anti-cardiolipin antibodies and beta-2 glycoprotein antibodies.   Work-up on 03/03/2018 revealed the following normal studies:  protein S antigen (101%), protein S activity (82%), protein C total (97%), protein C activity  (105%), ATIII (111%),   He has a history of prostate cancer.  He was diagnosed in 2011 after presenting with an elevated PSA.  He is followed by Dr. Yves Dill.  He was treated in the [place] with HIFU procedure.  PSA was 0.1 in 03/2017 and 0.12 on 12/01/2017.  Last colonoscopy was within the past 10 years.  CBC on 11/10/2017 revealed a hematocrit of 29.8 and hemoglobin 10.1.  Creatinine ranged between 0.85 - 0.99.  CBC on 12/01/2017 revealed a hematocrit was 36.6, hemoglobin 12.4, MCV 86.7, platelets 324,000, WBC 6500 with an [place] of 3600 on 12/01/2017.  Sodium was 130.  Ferritin was 113 with an iron saturation of 11% with a TIBC of 316.    Symptomatically, patient is doing well.  He denies any acute physical  concerns. Patient has no claudication pain, shortness of breath, or chest pain. Exam is unremarkable.   Plan: 1. Review interval labs- protein C, protein S, and ATIII normal. 2. Hypercoagulable work-up revealed no laboratory abnormality.  Inciting event appears to be knee surgery. 3. Continue Eliquis for a minimum 6 months. He has been on anticoagulation therapy for 4 months at this point. Will check labs in 2 months (D-dimer). If negative, will discontinue anticoagulation. Discussed with patient that development of subsequent thrombus formation will necessitate lifelong anticoagulation.  4. Discuss plans for having RIGHT knee surgery. Plan is to have IVC filter placed 2 weeks prior to having knee procedure. Will discuss with orthopedics (Dr. Rudene Christians).  5. RTC in 2 months for MD assessment and labs (CBC with diff, CMP, D-dimer -day before), and discussions regarding anticoagulation discontinuation.   Honor Loh, NP  03/10/2018, 4:02 PM   I saw and evaluated the patient, participating in the key portions of the service and reviewing pertinent diagnostic studies and records.  I reviewed the nurse practitioner's note and agree with the findings and the plan.  The assessment and plan were discussed with  the patient.  Several questions were asked by the patient and answered.   Nolon Stalls, MD 03/10/2018,4:02 PM

## 2018-05-05 ENCOUNTER — Inpatient Hospital Stay: Payer: Managed Care, Other (non HMO) | Attending: Hematology and Oncology

## 2018-05-05 ENCOUNTER — Other Ambulatory Visit: Payer: Self-pay | Admitting: Urgent Care

## 2018-05-05 DIAGNOSIS — I2699 Other pulmonary embolism without acute cor pulmonale: Secondary | ICD-10-CM | POA: Diagnosis present

## 2018-05-05 LAB — COMPREHENSIVE METABOLIC PANEL
ALT: 23 U/L (ref 0–44)
ANION GAP: 8 (ref 5–15)
AST: 27 U/L (ref 15–41)
Albumin: 4 g/dL (ref 3.5–5.0)
Alkaline Phosphatase: 67 U/L (ref 38–126)
BUN: 13 mg/dL (ref 8–23)
CHLORIDE: 101 mmol/L (ref 98–111)
CO2: 23 mmol/L (ref 22–32)
Calcium: 9.1 mg/dL (ref 8.9–10.3)
Creatinine, Ser: 1.1 mg/dL (ref 0.61–1.24)
GFR calc non Af Amer: >60 mL/min (ref >60)
Glucose, Bld: 92 mg/dL (ref 70–99)
Potassium: 4.2 mmol/L (ref 3.5–5.1)
SODIUM: 132 mmol/L — AB (ref 135–145)
Total Bilirubin: 0.5 mg/dL (ref 0.3–1.2)
Total Protein: 7.4 g/dL (ref 6.5–8.1)

## 2018-05-05 LAB — CBC WITH DIFFERENTIAL/PLATELET
Basophils Absolute: 0.1 10*3/uL (ref 0–0.1)
Basophils Relative: 1 %
Eosinophils Absolute: 0.1 10*3/uL (ref 0–0.7)
Eosinophils Relative: 2 %
HEMATOCRIT: 39.1 % — AB (ref 40.0–52.0)
HEMOGLOBIN: 13.1 g/dL (ref 13.0–18.0)
LYMPHS ABS: 1.7 10*3/uL (ref 1.0–3.6)
LYMPHS PCT: 26 %
MCH: 27.5 pg (ref 26.0–34.0)
MCHC: 33.4 g/dL (ref 32.0–36.0)
MCV: 82.4 fL (ref 80.0–100.0)
Monocytes Absolute: 0.9 10*3/uL (ref 0.2–1.0)
Monocytes Relative: 14 %
NEUTROS ABS: 3.8 10*3/uL (ref 1.4–6.5)
NEUTROS PCT: 57 %
Platelets: 268 10*3/uL (ref 150–440)
RBC: 4.75 MIL/uL (ref 4.40–5.90)
RDW: 15.1 % — ABNORMAL HIGH (ref 11.5–14.5)
WBC: 6.6 10*3/uL (ref 3.8–10.6)

## 2018-05-05 LAB — FIBRIN DERIVATIVES D-DIMER (ARMC ONLY): Fibrin derivatives D-dimer (ARMC): 1578.14 ng/mL (FEU) — ABNORMAL HIGH (ref 0.00–499.00)

## 2018-05-06 ENCOUNTER — Telehealth: Payer: Self-pay | Admitting: *Deleted

## 2018-05-06 NOTE — Telephone Encounter (Signed)
Called patient to inform him that his d-dimer is still elevated.  Inquired as to symptoms: SOB, leg swelling, chest pain.  Patient negative for all three.  Patient advised to keep appointment with MD tomorrow.

## 2018-05-07 ENCOUNTER — Inpatient Hospital Stay (HOSPITAL_BASED_OUTPATIENT_CLINIC_OR_DEPARTMENT_OTHER): Payer: Managed Care, Other (non HMO) | Admitting: Hematology and Oncology

## 2018-05-07 ENCOUNTER — Encounter: Payer: Self-pay | Admitting: Hematology and Oncology

## 2018-05-07 ENCOUNTER — Ambulatory Visit
Admission: RE | Admit: 2018-05-07 | Discharge: 2018-05-07 | Disposition: A | Discharge status: Home / Self Care | Payer: Managed Care, Other (non HMO) | Source: Ambulatory Visit | Attending: Urgent Care | Admitting: Urgent Care

## 2018-05-07 VITALS — BP 146/88 | HR 87

## 2018-05-07 DIAGNOSIS — Z96652 Presence of left artificial knee joint: Secondary | ICD-10-CM | POA: Diagnosis not present

## 2018-05-07 DIAGNOSIS — Z7901 Long term (current) use of anticoagulants: Secondary | ICD-10-CM

## 2018-05-07 DIAGNOSIS — I2699 Other pulmonary embolism without acute cor pulmonale: Secondary | ICD-10-CM

## 2018-05-07 DIAGNOSIS — Z87891 Personal history of nicotine dependence: Secondary | ICD-10-CM

## 2018-05-07 NOTE — Progress Notes (Signed)
Patient offers no complaints today. 

## 2018-05-07 NOTE — Progress Notes (Signed)
[facility] day:  05/07/2018   Chief Complaint: Johnny Freeman is a 67 y.o. male with acute pulmonary embolism s/p left knee replacement who is seen for 2 month assessment.  HPI:  The patient was last seen in the hematology clinic on 03/10/2018.  At that time, he was doing well.  He denied any acute physical concerns. Patient had no claudication pain, shortness of breath, or chest pain. Exam was unremarkable.  Hypercoagulable work-up revealed no laboratory abnormality.  Inciting event appeared to be knee surgery.  We discussed continuation of Eliquis for a minimum 6 months  He discussed the plan for RIGHT knee surgery. Plan was to have IVC filter placed 2 weeks prior to having knee procedure.   Labs on 05/05/2018 revealed a normal CBC with diff.  CMP was notable for a sodium of 132. D-dimer was 1578.14.  Symptomatically, patient is doing well. He denies any acute complains today. He denies chest pain, shortness or breath, and palpitations. He has no claudication pain or appreciable swelling in his LEFT lower extremity. Patient is looking to have contralateral knee surgery in November or December of this year.  He is wanting to expedite the surgery due to the fact that he has meet is OOP requirement for the year with his insurance company. Additionally, patient has already exhausted his physical therapy visits (maximum) for the year. Having surgery towards the end of the year will allow for him to have physical therapy in the new calendar year.   Past Medical History:  Diagnosis Date  . Anxiety   . Cancer [facility])    prostate 2011  . GERD (gastroesophageal reflux disease)   . Headache    migraines    Past Surgical History:  Procedure Laterality Date  . CYSTOSCOPY N/A 11/04/2017   Procedure: CYSTOSCOPY;  Surgeon: Hessie Knows, MD;  Location: [facility] ORS;  Service: Orthopedics;  Laterality: N/A;  . ROTATOR CUFF REPAIR Right 2003  . TOTAL KNEE  ARTHROPLASTY Left 11/04/2017   Procedure: TOTAL KNEE ARTHROPLASTY;  Surgeon: Hessie Knows, MD;  Location: [facility] ORS;  Service: Orthopedics;  Laterality: Left;    History reviewed. No pertinent family history.  Social History:  reports that he quit smoking about 45 years ago. He has never used smokeless tobacco. He reports that he does not drink alcohol or use drugs.  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 Patient had 2 children; one passed at age 23 from a MI. The patient is accompanied by his wife, Jacqlyn Larsen, today.  Allergies:  Allergies  Allergen Reactions  . Shellfish Allergy Swelling    Swelling in eyes    Current Medications: Current Outpatient Medications  Medication Sig Dispense Refill  . acetaminophen (TYLENOL) 500 MG tablet Take 500 mg by mouth 2 (two) times daily as needed (FOR PAIN.).    Marland Kitchen Cholecalciferol (VITAMIN D-3 PO) Take 1 tablet by mouth daily with lunch.    . diltiazem (CARDIZEM CD) 180 MG 24 hr capsule Take 180 mg by mouth daily.    Marland Kitchen docusate sodium (COLACE) 100 MG capsule Take 100 mg by mouth daily.    Marland Kitchen ELIQUIS 5 MG TABS tablet TAKE ONE TABLET TWICE DAILY 180 tablet 0  . Multiple Vitamin (MULTIVITAMIN WITH MINERALS) TABS tablet Take 1 tablet by mouth daily.    Marland Kitchen omeprazole (PRILOSEC) 20 MG capsule Take 20 mg by mouth daily before supper. Take 1 capsule (20 mg) by mouth 30 minutes  before supper  0  . Probiotic Product (PROBIOTIC-10 PO) Take 1 tablet by mouth daily.     No current facility-administered medications for this visit.     Review of Systems  Constitutional: Negative for diaphoresis, fever, malaise/fatigue and weight loss (no new weight).       Feels "great".  HENT: Negative.  Negative for congestion, ear discharge, ear pain, nosebleeds, sinus pain and sore throat.   Eyes: Negative.  Negative for blurred vision, double vision, photophobia, pain, discharge and redness.  Respiratory: Negative.  Negative for cough, hemoptysis,  sputum production and shortness of breath.   Cardiovascular: Negative.  Negative for chest pain, palpitations, orthopnea, leg swelling and PND.  Gastrointestinal: Negative.  Negative for abdominal pain, blood in stool, constipation, diarrhea, melena, nausea and vomiting.  Genitourinary: Negative.  Negative for dysuria, frequency, hematuria and urgency.  Musculoskeletal: Positive for joint pain (s/p left knee replacement.  Right knee surgery planned before end of year.). Negative for back pain, falls and myalgias.  Skin: Negative for itching and rash.  Neurological: Negative for dizziness, tremors, sensory change, focal weakness, weakness and headaches.  Endo/Heme/Allergies: Does not bruise/bleed easily.       Anticoagulation therapy - on apixaban  Psychiatric/Behavioral: Negative for depression and memory loss. The patient is not nervous/anxious and does not have insomnia.   All other systems reviewed and are negative.  Performance status (ECOG): 1  Physical Exam: Blood pressure (!) 146/88, pulse 87. GENERAL:  Well developed, well nourished, gentleman sitting comfortably in the exam room in no acute distress. MENTAL STATUS:  Alert and oriented to person, place and time. HEAD:  White hair and beard.  Normocephalic, atraumatic, face symmetric, no Cushingoid features. EYES:  Glasses.  Brown eyes.  Pupils equal round and reactive to light and accomodation.  No conjunctivitis or scleral icterus. ENT:  Oropharynx clear without lesion.  Tongue normal. Mucous membranes moist.  RESPIRATORY:  Clear to auscultation without rales, wheezes or rhonchi. CARDIOVASCULAR:  Regular rate and rhythm without murmur, rub or gallop. ABDOMEN:  Soft, non-tender, with active bowel sounds, and no hepatosplenomegaly.  No masses. SKIN:  Well healed left knee incision.  No rashes, ulcers or lesions. EXTREMITIES: No edema, no skin discoloration or tenderness.  No palpable cords. LYMPH NODES: No palpable cervical,  supraclavicular, axillary or inguinal adenopathy  NEUROLOGICAL: Unremarkable. PSYCH:  Appropriate.    Appointment on 05/05/2018  Component Date Value Ref Range Status  . Sodium 05/05/2018 132* 135 - 145 mmol/L Final  . Potassium 05/05/2018 4.2  3.5 - 5.1 mmol/L Final  . Chloride 05/05/2018 101  98 - 111 mmol/L Final  . CO2 05/05/2018 23  22 - 32 mmol/L Final  . Glucose, Bld 05/05/2018 92  70 - 99 mg/dL Final  . BUN 05/05/2018 13  8 - 23 mg/dL Final  . Creatinine, Ser 05/05/2018 1.10  0.61 - 1.24 mg/dL Final  . Calcium 05/05/2018 9.1  8.9 - 10.3 mg/dL Final  . Total Protein 05/05/2018 7.4  6.5 - 8.1 g/dL Final  . Albumin 05/05/2018 4.0  3.5 - 5.0 g/dL Final  . AST 05/05/2018 27  15 - 41 U/L Final  . ALT 05/05/2018 23  0 - 44 U/L Final  . Alkaline Phosphatase 05/05/2018 67  38 - 126 U/L Final  . Total Bilirubin 05/05/2018 0.5  0.3 - 1.2 mg/dL Final  . GFR calc non Af Amer 05/05/2018 >60  >60 mL/min Final  . GFR calc Af Amer 05/05/2018 >60  >60 mL/min Final  Comment: (NOTE) The eGFR has been calculated using the CKD EPI equation. This calculation has not been validated in all clinical situations. eGFR's persistently <60 mL/min signify possible Chronic Kidney Disease.   Georgiann Hahn gap 05/05/2018 8  5 - 15 Final   Performed at [facility], [address] [place], [place] [address]  . WBC 05/05/2018 6.6  3.8 - 10.6 K/uL Final  . RBC 05/05/2018 4.75  4.40 - 5.90 MIL/uL Final  . Hemoglobin 05/05/2018 13.1  13.0 - 18.0 g/dL Final  . HCT 05/05/2018 39.1* 40.0 - 52.0 % Final  . MCV 05/05/2018 82.4  80.0 - 100.0 fL Final  . MCH 05/05/2018 27.5  26.0 - 34.0 pg Final  . MCHC 05/05/2018 33.4  32.0 - 36.0 g/dL Final  . RDW 05/05/2018 15.1* 11.5 - 14.5 % Final  . Platelets 05/05/2018 268  150 - 440 K/uL Final  . Neutrophils Relative % 05/05/2018 57  % Final  . Neutro Abs 05/05/2018 3.8  1.4 - 6.5 K/uL Final  . Lymphocytes Relative 05/05/2018 26  % Final  . Lymphs Abs 05/05/2018 1.7   1.0 - 3.6 K/uL Final  . Monocytes Relative 05/05/2018 14  % Final  . Monocytes Absolute 05/05/2018 0.9  0.2 - 1.0 K/uL Final  . Eosinophils Relative 05/05/2018 2  % Final  . Eosinophils Absolute 05/05/2018 0.1  0 - 0.7 K/uL Final  . Basophils Relative 05/05/2018 1  % Final  . Basophils Absolute 05/05/2018 0.1  0 - 0.1 K/uL Final   Performed at [facility], [address], [place], [place] [address]  . Fibrin derivatives D-dimer ([facility]) 05/05/2018 1,578.14* 0.00 - 499.00 ng/mL (FEU) Final   Comment: (NOTE) <> Exclusion of Venous Thromboembolism (VTE) - OUTPATIENT ONLY   (Emergency Department or [place])   0-499 ng/ml (FEU): With a low to intermediate pretest probability                      for VTE this test result excludes the diagnosis                      of VTE.   >499 ng/ml (FEU) : VTE not excluded; additional work up for VTE is                      required. <> Testing on Inpatients and Evaluation of Disseminated Intravascular   Coagulation (DIC) Reference Range:   0-499 ng/ml (FEU) Performed at [facility], [address] [place], [place] [address]     Assessment:  KIRTIS CHALLIS is a 67 y.o. male with bilateral pulmonary emboli s/p left knee arthoplasty on 11/04/2017.  He is on Eliquis.  Chest CT angiogram on 11/06/2017 revealed acute pulmonary emboli within distal left lower lobe pulmonary artery with extension of thrombus into multiple segmental and subsegmental branches of the left lower lobe. Additional emboli was visualized within right upper lobe segmental and subsegmental branch vessels with suspected small emboli in right lower lobe and left upper lobe subsegmental branches. Imaging was positive for acute PE with CT evidence of right heart strain (RV/LV ratio = 1.46) consistent with at least submassive (intermediate risk) PE.  Bilateral lower extremity duplex on 12/03/2017 revealed no evidence of DVT.  Work-up on 12/01/2017 revealed the following  normal studies:  Factor V Leiden, prothrombin gene mutation, lupus anticoagulant panel, anti-cardiolipin antibodies and beta-2 glycoprotein antibodies.   Work-up on 03/03/2018 revealed the following  normal studies:  protein S antigen (101%), protein S activity (82%), protein C total (97%), protein C activity (105%), ATIII (111%),   He has a history of prostate cancer.  He was diagnosed in 2011 after presenting with an elevated PSA.  He is followed by Dr. Yves Dill.  He was treated in the [place] with HIFU procedure.  PSA was 0.1 in 03/2017 and 0.12 on 12/01/2017.  Last colonoscopy was within the past 10 years.  CBC on 11/10/2017 revealed a hematocrit of 29.8 and hemoglobin 10.1.  Creatinine ranged between 0.85 - 0.99.  CBC on 12/01/2017 revealed a hematocrit was 36.6, hemoglobin 12.4, MCV 86.7, platelets 324,000, WBC 6500 with an [place] of 3600 on 12/01/2017.  Sodium was 130.  Ferritin was 113 with an iron saturation of 11% with a TIBC of 316.    Symptomatically, he is doing well.  Exam is stable.  D-dimer was 1578.  Plan: 1. Review interval labs- elevated D-dimer.  Non-specific, but could indicate active clot breakdown.   2.   Bilateral pulmonary emboli:  Continue Eliquis.  Schedule bilateral lower extremity duplex in 3 months.   3.   Knee surgery:  s/p left knee replacement with plan for right knee replacement at the end of 2019.  Discuss IVC filter placement 2 weeks prior to surgery with Dr. Rudene Christians.  4.   RTC in 3 months for MD assessment and labs (CBC with diff, CMP, D-dimer -day before), and review of ultrasound.    Honor Loh, NP 05/07/2018, 3:39 PM   I saw and evaluated the patient, participating in the key portions of the service and reviewing pertinent diagnostic studies and records.  I reviewed the nurse practitioner's note and agree with the findings and the plan.  The assessment and plan were discussed with the patient.  Several questions were asked by the patient and answered.   Nolon Stalls, MD 05/07/2018,3:39 PM

## 2018-05-12 ENCOUNTER — Other Ambulatory Visit: Payer: Self-pay

## 2018-05-14 ENCOUNTER — Ambulatory Visit: Payer: Self-pay | Admitting: Urgent Care

## 2018-05-28 ENCOUNTER — Ambulatory Visit: Payer: Self-pay | Admitting: Family Medicine

## 2018-05-28 VITALS — BP 146/88 | HR 89 | Temp 97.9°F | Resp 20

## 2018-05-28 DIAGNOSIS — L249 Irritant contact dermatitis, unspecified cause: Secondary | ICD-10-CM

## 2018-05-28 NOTE — Progress Notes (Signed)
Subjective: hand rash     Johnny Freeman is a 67 y.o. male who presents for evaluation of a very mild rash involving the hands bilaterally. Rash started 2.5 weeks ago. Patient reports washing his hands very frequently throughout the day, contact with various harsh soaps and hand sanitizers, and complains of dry, cracked hands.  Denies itching or pain but reports occasional stinging.  Appearance has remained stable and improved some since he started using "utter balm". Patient denies any associated symptoms.  Denies any other lesions or systemic symptoms.  Patient reports a history of this in the past and that his dermatologist had recommended an over-the-counter moisturizing cream that resolved it but he cannot remember the name.  Believes it was Cetaphil.  Patient has not had contacts with similar rash. Patient has not had new exposures (soaps, lotions, laundry detergents, foods, soil, medications, plants, insects or animals).  Denies current or recent tinea infection.  Review of Systems Pertinent items noted in HPI and remainder of comprehensive ROS otherwise negative.    Objective:    General:  alert, cooperative, appears stated age and no distress  Skin:   Ill-defined scaling, mildly erythematous lesions with superficial fissures between finger webs and extending slightly to the dorsum of hand.  Lesions are barely visible and very mild.  Otherwise skin is warm, dry, and intact without any other lesions.  Normal skin turgor and pigment.  Normal capillary refill and pulses.     Assessment:   Contact dermatitis   Plan:    Medications: moisturizing cream.  Discussed lifestyle modifications, such as avoiding irritating chemicals, frequent hand washing with harsh soaps, and the use of appropriate emollients. Written and verbal patient instruction given.    Advised patient to follow-up with his primary care provider or return to our clinic if symptoms do not improve or worsen. Patient's blood  pressure is elevated at 146/88 today.  Discussed normal values with patient.  Advised patient to monitor his blood pressure daily and report abnormal values to his primary care provider. Red flag symptoms and indications to seek medical care discussed.

## 2018-08-03 ENCOUNTER — Other Ambulatory Visit: Payer: Self-pay | Admitting: Urgent Care

## 2018-08-03 DIAGNOSIS — I2699 Other pulmonary embolism without acute cor pulmonale: Secondary | ICD-10-CM

## 2018-08-10 ENCOUNTER — Inpatient Hospital Stay: Payer: Managed Care, Other (non HMO) | Attending: Hematology and Oncology

## 2018-08-10 ENCOUNTER — Other Ambulatory Visit: Payer: Self-pay

## 2018-08-10 DIAGNOSIS — I2699 Other pulmonary embolism without acute cor pulmonale: Secondary | ICD-10-CM | POA: Diagnosis not present

## 2018-08-10 DIAGNOSIS — Z23 Encounter for immunization: Secondary | ICD-10-CM | POA: Insufficient documentation

## 2018-08-10 LAB — COMPREHENSIVE METABOLIC PANEL
ALT: 25 U/L (ref 0–44)
AST: 27 U/L (ref 15–41)
Albumin: 4.2 g/dL (ref 3.5–5.0)
Alkaline Phosphatase: 68 U/L (ref 38–126)
Anion gap: 10 (ref 5–15)
BUN: 11 mg/dL (ref 8–23)
CO2: 25 mmol/L (ref 22–32)
Calcium: 9.3 mg/dL (ref 8.9–10.3)
Chloride: 97 mmol/L — ABNORMAL LOW (ref 98–111)
Creatinine, Ser: 0.98 mg/dL (ref 0.61–1.24)
GFR calc Af Amer: >60 mL/min (ref >60)
GFR calc non Af Amer: >60 mL/min (ref >60)
Glucose, Bld: 104 mg/dL — ABNORMAL HIGH (ref 70–99)
Potassium: 4.2 mmol/L (ref 3.5–5.1)
Sodium: 132 mmol/L — ABNORMAL LOW (ref 135–145)
Total Bilirubin: 0.5 mg/dL (ref 0.3–1.2)
Total Protein: 7.4 g/dL (ref 6.5–8.1)

## 2018-08-10 LAB — CBC WITH DIFFERENTIAL/PLATELET
Abs Immature Granulocytes: 0.01 10*3/uL (ref 0.00–0.07)
Basophils Absolute: 0 10*3/uL (ref 0.0–0.1)
Basophils Relative: 1 %
Eosinophils Absolute: 0.1 10*3/uL (ref 0.0–0.5)
Eosinophils Relative: 2 %
HCT: 42.1 % (ref 39.0–52.0)
Hemoglobin: 13.6 g/dL (ref 13.0–17.0)
Immature Granulocytes: 0 %
Lymphocytes Relative: 29 %
Lymphs Abs: 1.7 10*3/uL (ref 0.7–4.0)
MCH: 26.9 pg (ref 26.0–34.0)
MCHC: 32.3 g/dL (ref 30.0–36.0)
MCV: 83.2 fL (ref 80.0–100.0)
Monocytes Absolute: 0.8 10*3/uL (ref 0.1–1.0)
Monocytes Relative: 14 %
Neutro Abs: 3 10*3/uL (ref 1.7–7.7)
Neutrophils Relative %: 54 %
Platelets: 246 10*3/uL (ref 150–400)
RBC: 5.06 MIL/uL (ref 4.22–5.81)
RDW: 14.1 % (ref 11.5–15.5)
WBC: 5.7 10*3/uL (ref 4.0–10.5)
nRBC: 0 % (ref 0.0–0.2)

## 2018-08-10 LAB — FIBRIN DERIVATIVES D-DIMER (ARMC ONLY): Fibrin derivatives D-dimer (ARMC): 1154.97 ng/mL (FEU) — ABNORMAL HIGH (ref 0.00–499.00)

## 2018-08-11 ENCOUNTER — Inpatient Hospital Stay (HOSPITAL_BASED_OUTPATIENT_CLINIC_OR_DEPARTMENT_OTHER): Payer: Managed Care, Other (non HMO) | Admitting: Hematology and Oncology

## 2018-08-11 ENCOUNTER — Ambulatory Visit: Payer: Managed Care, Other (non HMO)

## 2018-08-11 ENCOUNTER — Encounter: Payer: Self-pay | Admitting: Hematology and Oncology

## 2018-08-11 VITALS — BP 150/90 | HR 108 | Temp 97.5°F | Resp 18 | Wt 218.3 lb

## 2018-08-11 DIAGNOSIS — Z87891 Personal history of nicotine dependence: Secondary | ICD-10-CM | POA: Diagnosis not present

## 2018-08-11 DIAGNOSIS — Z96652 Presence of left artificial knee joint: Secondary | ICD-10-CM

## 2018-08-11 DIAGNOSIS — I2699 Other pulmonary embolism without acute cor pulmonale: Secondary | ICD-10-CM | POA: Diagnosis not present

## 2018-08-11 DIAGNOSIS — Z23 Encounter for immunization: Secondary | ICD-10-CM

## 2018-08-11 MED ORDER — INFLUENZA VAC SPLIT HIGH-DOSE 0.5 ML IM SUSY
0.5000 mL | PREFILLED_SYRINGE | Freq: Once | INTRAMUSCULAR | Status: AC
  Administered 2018-08-11: 0.5 mL via INTRAMUSCULAR
  Filled 2018-08-11: qty 0.5

## 2018-08-11 NOTE — Progress Notes (Signed)
Pt to call office once he had Surgery completed.

## 2018-08-11 NOTE — Progress Notes (Signed)
Patient here today for follow up.  States Dr. Meade Maw is adjusting his BP medication.

## 2018-08-11 NOTE — Progress Notes (Signed)
[facility] day:  08/11/2018   Chief Complaint: Johnny Freeman is a 67 y.o. male with acute pulmonary embolism s/p left knee replacement who is seen for 3 month assessment.  HPI:  The patient was last seen in the hematology clinic on 05/07/2018.  At that time, he was doing well.  Exam was stable.  D-dimer was 1578.  He continued Eliquis.  Bilateral lower extremity duplex on 05/07/2018 revealed no evidence of DVT.  Labs on 08/10/2018 revealed a hematocrit of 42.1, hemoglobin 13.6, platelets 246,000, and WBC 5700.  Creatinine was 0.98.  LFTS were normal.  D-dimers were 1154.97.  During the interim, he has done well.  He voices no complaints.  He denies any bruising or bleeding.  He notes a plan for right knee surgery before the ned of the year.   Past Medical History:  Diagnosis Date  . Anxiety   . Cancer [facility])    prostate 2011  . GERD (gastroesophageal reflux disease)   . Headache    migraines    Past Surgical History:  Procedure Laterality Date  . CYSTOSCOPY N/A 11/04/2017   Procedure: CYSTOSCOPY;  Surgeon: Hessie Knows, MD;  Location: [facility] ORS;  Service: Orthopedics;  Laterality: N/A;  . ROTATOR CUFF REPAIR Right 2003  . TOTAL KNEE ARTHROPLASTY Left 11/04/2017   Procedure: TOTAL KNEE ARTHROPLASTY;  Surgeon: Hessie Knows, MD;  Location: [facility] ORS;  Service: Orthopedics;  Laterality: Left;    History reviewed. No pertinent family history.  Social History:  reports that he quit smoking about 46 years ago. He has never used smokeless tobacco. He reports that he does not drink alcohol or use drugs.  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  He lives in [place]. Patient had 2 children; one passed at age 6 from a MI. His wife's name is Clinical cytogeneticist.  The patient is alone today.  Allergies:  Allergies  Allergen Reactions  . Shellfish Allergy Swelling    Swelling in eyes    Current Medications: Current Outpatient Medications   Medication Sig Dispense Refill  . acetaminophen (TYLENOL) 500 MG tablet Take 500 mg by mouth 2 (two) times daily as needed (FOR PAIN.).    Marland Kitchen benazepril (LOTENSIN) 10 MG tablet Take 10 mg by mouth daily.    . Cholecalciferol (VITAMIN D-3 PO) Take 1 tablet by mouth daily with lunch.    . diltiazem (CARDIZEM CD) 180 MG 24 hr capsule Take 180 mg by mouth daily.    Marland Kitchen docusate sodium (COLACE) 100 MG capsule Take 100 mg by mouth daily.    Marland Kitchen ELIQUIS 5 MG TABS tablet TAKE 1 TABLET BY MOUTH TWICE DAILY 180 tablet 0  . Multiple Vitamin (MULTIVITAMIN WITH MINERALS) TABS tablet Take 1 tablet by mouth daily.    Marland Kitchen omeprazole (PRILOSEC) 20 MG capsule Take 20 mg by mouth daily before supper. Take 1 capsule (20 mg) by mouth 30 minutes before supper  0  . Probiotic Product (PROBIOTIC-10 PO) Take 1 tablet by mouth daily.    Marland Kitchen triamcinolone cream (KENALOG) 0.1 % Apply 1 application topically 2 (two) times daily.     No current facility-administered medications for this visit.     Review of Systems  Constitutional: Negative for chills, diaphoresis, fever, malaise/fatigue and weight loss (up 1 pound).  HENT: Negative for congestion, ear discharge, ear pain, nosebleeds, sinus pain and sore throat.   Eyes: Negative.  Negative for blurred vision, double vision, photophobia, pain, discharge and redness.  Respiratory: Negative.  Negative for cough, hemoptysis, sputum production and shortness of breath.   Cardiovascular: Negative.  Negative for chest pain, palpitations, orthopnea, leg swelling and PND.  Gastrointestinal: Negative.  Negative for abdominal pain, blood in stool, constipation, diarrhea, melena, nausea and vomiting.  Genitourinary: Negative.  Negative for dysuria, frequency, hematuria and urgency.  Musculoskeletal: Positive for joint pain (s/p left knee replacement.  Right knee surgery planned before end of year.). Negative for back pain, falls and myalgias.  Skin: Negative.  Negative for itching and rash.   Neurological: Positive for headaches (slight some days). Negative for dizziness, sensory change, speech change, focal weakness and weakness.  Endo/Heme/Allergies: Does not bruise/bleed easily.       On apixaban (Eliquis).  Psychiatric/Behavioral: Negative for memory loss. The patient is not nervous/anxious.   All other systems reviewed and are negative.  Performance status (ECOG): 1  Physical Exam: Blood pressure (!) 150/90, pulse (!) 108, temperature (!) 97.5 F (36.4 C), temperature source Tympanic, resp. rate 18, weight 218 lb 5 oz (99 kg). GENERAL:  Well developed, well nourished, gentleman sitting comfortably in the exam room in no acute distress. MENTAL STATUS:  Alert and oriented to person, place and time. HEAD:  White hair and beard.  Normocephalic, atraumatic, face symmetric, no Cushingoid features. EYES:  Glasses.  Brown eyes.  Pupils equal round and reactive to light and accomodation.  No conjunctivitis or scleral icterus. ENT:  Oropharynx clear without lesion.  Tongue normal. Mucous membranes moist.  RESPIRATORY:  Clear to auscultation without rales, wheezes or rhonchi. CARDIOVASCULAR:  Regular rate and rhythm without murmur, rub or gallop. ABDOMEN:  Soft, non-tender, with active bowel sounds, and no hepatosplenomegaly.  No masses. SKIN:  No rashes, ulcers or lesions. EXTREMITIES: No edema, no skin discoloration or tenderness.  No palpable cords. LYMPH NODES: No palpable cervical, supraclavicular, axillary or inguinal adenopathy  NEUROLOGICAL: Unremarkable. PSYCH:  Appropriate.    Orders Only on 08/10/2018  Component Date Value Ref Range Status  . Fibrin derivatives D-dimer ([facility]) 08/10/2018 1,154.97* 0.00 - 499.00 ng/mL (FEU) Final   Comment: (NOTE) <> Exclusion of Venous Thromboembolism (VTE) - OUTPATIENT ONLY   (Emergency Department or [place])   0-499 ng/ml (FEU): With a low to intermediate pretest probability                      for VTE this test result excludes  the diagnosis                      of VTE.   >499 ng/ml (FEU) : VTE not excluded; additional work up for VTE is                      required. <> Testing on Inpatients and Evaluation of Disseminated Intravascular   Coagulation (DIC) Reference Range:   0-499 ng/ml (FEU) Performed at [facility], [address], [place], [place] [address]   . Sodium 08/10/2018 132* 135 - 145 mmol/L Final  . Potassium 08/10/2018 4.2  3.5 - 5.1 mmol/L Final  . Chloride 08/10/2018 97* 98 - 111 mmol/L Final  . CO2 08/10/2018 25  22 - 32 mmol/L Final  . Glucose, Bld 08/10/2018 104* 70 - 99 mg/dL Final  . BUN 08/10/2018 11  8 - 23 mg/dL Final  . Creatinine, Ser 08/10/2018 0.98  0.61 - 1.24 mg/dL Final  . Calcium 08/10/2018 9.3  8.9 - 10.3 mg/dL Final  . Total Protein  08/10/2018 7.4  6.5 - 8.1 g/dL Final  . Albumin 08/10/2018 4.2  3.5 - 5.0 g/dL Final  . AST 08/10/2018 27  15 - 41 U/L Final  . ALT 08/10/2018 25  0 - 44 U/L Final  . Alkaline Phosphatase 08/10/2018 68  38 - 126 U/L Final  . Total Bilirubin 08/10/2018 0.5  0.3 - 1.2 mg/dL Final  . GFR calc non Af Amer 08/10/2018 >60  >60 mL/min Final  . GFR calc Af Amer 08/10/2018 >60  >60 mL/min Final   Comment: (NOTE) The eGFR has been calculated using the CKD EPI equation. This calculation has not been validated in all clinical situations. eGFR's persistently <60 mL/min signify possible Chronic Kidney Disease.   Georgiann Hahn gap 08/10/2018 10  5 - 15 Final   Performed at [facility], [address] [place], [place] [address]  . WBC 08/10/2018 5.7  4.0 - 10.5 K/uL Final  . RBC 08/10/2018 5.06  4.22 - 5.81 MIL/uL Final  . Hemoglobin 08/10/2018 13.6  13.0 - 17.0 g/dL Final  . HCT 08/10/2018 42.1  39.0 - 52.0 % Final  . MCV 08/10/2018 83.2  80.0 - 100.0 fL Final  . MCH 08/10/2018 26.9  26.0 - 34.0 pg Final  . MCHC 08/10/2018 32.3  30.0 - 36.0 g/dL Final  . RDW 08/10/2018 14.1  11.5 - 15.5 % Final  . Platelets 08/10/2018 246  150 - 400 K/uL  Final  . nRBC 08/10/2018 0.0  0.0 - 0.2 % Final  . Neutrophils Relative % 08/10/2018 54  % Final  . Neutro Abs 08/10/2018 3.0  1.7 - 7.7 K/uL Final  . Lymphocytes Relative 08/10/2018 29  % Final  . Lymphs Abs 08/10/2018 1.7  0.7 - 4.0 K/uL Final  . Monocytes Relative 08/10/2018 14  % Final  . Monocytes Absolute 08/10/2018 0.8  0.1 - 1.0 K/uL Final  . Eosinophils Relative 08/10/2018 2  % Final  . Eosinophils Absolute 08/10/2018 0.1  0.0 - 0.5 K/uL Final  . Basophils Relative 08/10/2018 1  % Final  . Basophils Absolute 08/10/2018 0.0  0.0 - 0.1 K/uL Final  . Immature Granulocytes 08/10/2018 0  % Final  . Abs Immature Granulocytes 08/10/2018 0.01  0.00 - 0.07 K/uL Final   Performed at [facility], [address] [place],  [address]    Assessment:  JARY LOUVIER is a 67 y.o. male with bilateral pulmonary emboli s/p left knee arthoplasty on 11/04/2017.  He is on Eliquis.  Chest CT angiogram on 11/06/2017 revealed acute pulmonary emboli within distal left lower lobe pulmonary artery with extension of thrombus into multiple segmental and subsegmental branches of the left lower lobe. Additional emboli was visualized within right upper lobe segmental and subsegmental branch vessels with suspected small emboli in right lower lobe and left upper lobe subsegmental branches. Imaging was positive for acute PE with CT evidence of right heart strain (RV/LV ratio = 1.46) consistent with at least submassive (intermediate risk) PE.  Bilateral lower extremity duplex on 12/03/2017 revealed no evidence of DVT.  Bilateral lower extremity duplex on 05/07/2018 revealed no evidence of DVT.  Work-up on 12/01/2017 revealed the following normal studies:  Factor V Leiden, prothrombin gene mutation, lupus anticoagulant panel, anti-cardiolipin antibodies and beta-2 glycoprotein antibodies.   Work-up on 03/03/2018 revealed the following normal studies:  protein S antigen (101%), protein S activity (82%),  protein C total (97%), protein C activity (105%), ATIII (111%),   He has a history of  prostate cancer.  He was diagnosed in 2011 after presenting with an elevated PSA.  He is followed by Dr. Yves Dill.  He was treated in the [place] with HIFU procedure.  PSA was 0.1 in 03/2017 and 0.12 on 12/01/2017.  Last colonoscopy was within the past 10 years.  CBC on 11/10/2017 revealed a hematocrit of 29.8 and hemoglobin 10.1.  Creatinine ranged between 0.85 - 0.99.  CBC on 12/01/2017 revealed a hematocrit was 36.6, hemoglobin 12.4, MCV 86.7, platelets 324,000, WBC 6500 with an [place] of 3600 on 12/01/2017.  Sodium was 130.  Ferritin was 113 with an iron saturation of 11% with a TIBC of 316.    Symptomatically, he is doing well on Eliquis.  He notes a plan for right knee surgery before the end of the year.  Exam is stable.  Plan: 1.  Review interval labs.  CBC and CMP normal.  D-dimers remain elevated. 2.   Bilateral pulmonary emboli  Continue Eliquis.  Interval bilateral lower extremity duplex revealed no DVT.   3.   Knee surgery  s/p left knee replacement with plan for right knee replacement at the end of 2019.  Anticipate IVC placement prior to knee surgery.  Influenza vaccine today. 4.   Health maintenance  Influenza vaccine today. 5.   RTC after surgery for MD assessment and labs (CBC with diff, CMP, D-dimer -day before).   Nolon Stalls, MD 08/11/2018, 4:35 PM

## 2018-10-28 ENCOUNTER — Other Ambulatory Visit: Payer: Self-pay | Admitting: Orthopedic Surgery

## 2018-10-28 DIAGNOSIS — M25561 Pain in right knee: Principal | ICD-10-CM

## 2018-10-28 DIAGNOSIS — G8929 Other chronic pain: Secondary | ICD-10-CM

## 2018-11-03 ENCOUNTER — Other Ambulatory Visit: Payer: Self-pay | Admitting: Urgent Care

## 2018-11-03 ENCOUNTER — Ambulatory Visit
Admission: RE | Admit: 2018-11-03 | Discharge: 2018-11-03 | Disposition: A | Discharge status: Home / Self Care | Payer: Managed Care, Other (non HMO) | Source: Ambulatory Visit | Attending: Orthopedic Surgery | Admitting: Orthopedic Surgery

## 2018-11-03 DIAGNOSIS — M25561 Pain in right knee: Secondary | ICD-10-CM | POA: Diagnosis not present

## 2018-11-03 DIAGNOSIS — I2699 Other pulmonary embolism without acute cor pulmonale: Secondary | ICD-10-CM

## 2018-11-03 DIAGNOSIS — G8929 Other chronic pain: Secondary | ICD-10-CM | POA: Diagnosis present

## 2018-12-08 ENCOUNTER — Other Ambulatory Visit: Payer: Self-pay

## 2018-12-08 ENCOUNTER — Ambulatory Visit (INDEPENDENT_AMBULATORY_CARE_PROVIDER_SITE_OTHER): Payer: Managed Care, Other (non HMO) | Admitting: Vascular Surgery

## 2018-12-08 ENCOUNTER — Encounter (INDEPENDENT_AMBULATORY_CARE_PROVIDER_SITE_OTHER): Payer: Self-pay | Admitting: Vascular Surgery

## 2018-12-08 ENCOUNTER — Encounter (INDEPENDENT_AMBULATORY_CARE_PROVIDER_SITE_OTHER): Payer: Self-pay

## 2018-12-08 VITALS — BP 138/80 | HR 90 | Resp 16 | Ht 69.0 in | Wt 216.0 lb

## 2018-12-08 DIAGNOSIS — I2699 Other pulmonary embolism without acute cor pulmonale: Secondary | ICD-10-CM

## 2018-12-08 DIAGNOSIS — E785 Hyperlipidemia, unspecified: Secondary | ICD-10-CM

## 2018-12-08 DIAGNOSIS — M1711 Unilateral primary osteoarthritis, right knee: Secondary | ICD-10-CM | POA: Insufficient documentation

## 2018-12-08 DIAGNOSIS — I10 Essential (primary) hypertension: Secondary | ICD-10-CM

## 2018-12-08 DIAGNOSIS — Z79899 Other long term (current) drug therapy: Secondary | ICD-10-CM

## 2018-12-08 DIAGNOSIS — Z87891 Personal history of nicotine dependence: Secondary | ICD-10-CM

## 2018-12-08 NOTE — Progress Notes (Signed)
Patient ID: Johnny Freeman, male   DOB: 04-06-1951, 68 y.o.   MRN: [id number]  Chief Complaint  Patient presents with  . New Patient (Initial Visit)    ref Rudene Christians discuss ivc filter placement    HPI Johnny Freeman is a 68 y.o. male.  I am asked to see the patient by Dr. Rudene Christians for evaluation of IVC filter placement.  The patient reports with his left knee replacement last year he developed perioperative DVT and PE.  This was then treated with appropriate anticoagulation therapy which he tolerated well. No current issues, but he is scheduled to have a right knee replacement at the end of the month.  Due to his previous DVT and PE with knee replacement he is very high risk for further thromboembolic events and his orthopedist has recommended he have an IVC filter placed preliminary to his next knee replacement.  That is scheduled for March 31.  He has pretty debilitating arthritis and knee replacement is basically his only option.     Past Medical History:  Diagnosis Date  . Anxiety   . Cancer [facility])    prostate 2011  . GERD (gastroesophageal reflux disease)   . Headache    migraines    Past Surgical History:  Procedure Laterality Date  . CYSTOSCOPY N/A 11/04/2017   Procedure: CYSTOSCOPY;  Surgeon: Hessie Knows, MD;  Location: [facility] ORS;  Service: Orthopedics;  Laterality: N/A;  . ROTATOR CUFF REPAIR Right 2003  . TOTAL KNEE ARTHROPLASTY Left 11/04/2017   Procedure: TOTAL KNEE ARTHROPLASTY;  Surgeon: Hessie Knows, MD;  Location: [facility] ORS;  Service: Orthopedics;  Laterality: Left;    Family History Family History  Problem Relation Age of Onset  . Heart attack Father   No bleeding disorders, clotting disorders, or aneurysms  Social History Social History   Tobacco Use  . Smoking status: Former Smoker    Last attempt to quit: 10/03/1972    Years since quitting: 46.2  . Smokeless tobacco: Never Used  Substance Use Topics  . Alcohol use: No    Alcohol/week: 0.0 standard drinks    Frequency: Never  . Drug use: No     Allergies  Allergen Reactions  . Shellfish Allergy Swelling    Swelling in eyes    Current Outpatient Medications  Medication Sig Dispense Refill  . acetaminophen (TYLENOL) 500 MG tablet Take 500 mg by mouth 2 (two) times daily as needed (FOR PAIN.).    Marland Kitchen benazepril (LOTENSIN) 10 MG tablet Take 10 mg by mouth daily.    . Cholecalciferol (VITAMIN D-3 PO) Take 1 tablet by mouth daily with lunch.    . diltiazem (CARDIZEM CD) 180 MG 24 hr capsule Take 180 mg by mouth daily.    Marland Kitchen docusate sodium (COLACE) 100 MG capsule Take 100 mg by mouth daily.    Marland Kitchen ELIQUIS 5 MG TABS tablet TAKE 1 TABLET BY MOUTH TWICE DAILY 180 tablet 0  . Multiple Vitamin (MULTIVITAMIN WITH MINERALS) TABS tablet Take 1 tablet by mouth daily.    Marland Kitchen omeprazole (PRILOSEC) 20 MG capsule Take 20 mg by mouth daily before supper. Take 1 capsule (20 mg) by mouth 30 minutes before supper  0  . Probiotic Product (PROBIOTIC-10 PO) Take 1 tablet by mouth daily.    Marland Kitchen triamcinolone cream (KENALOG) 0.1 % Apply 1 application topically 2 (two) times daily.     No current facility-administered medications for this visit.       REVIEW OF SYSTEMS (Negative  unless checked)  Constitutional: [] Weight loss  [] Fever  [] Chills Cardiac: [] Chest pain   [] Chest pressure   [] Palpitations   [] Shortness of breath when laying flat   [] Shortness of breath at rest   [] Shortness of breath with exertion. Vascular:  [x] Pain in legs with walking   [x] Pain in legs at rest   [] Pain in legs when laying flat   [] Claudication   [] Pain in feet when walking  [] Pain in feet at rest  [] Pain in feet when laying flat   [x] History of DVT   [] Phlebitis   [] Swelling in legs   [] Varicose veins   [] Non-healing ulcers Pulmonary:   [] Uses home oxygen   [] Productive cough   [] Hemoptysis   [] Wheeze  [] COPD   [] Asthma Neurologic:  [] Dizziness  [] Blackouts   [] Seizures   [] History of stroke   [] History of TIA  [] Aphasia   [] Temporary  blindness   [] Dysphagia   [] Weakness or numbness in arms   [] Weakness or numbness in legs Musculoskeletal:  [x] Arthritis   [] Joint swelling   [x] Joint pain   [] Low back pain Hematologic:  [] Easy bruising  [] Easy bleeding   [] Hypercoagulable state   [] Anemic  [] Hepatitis Gastrointestinal:  [] Blood in stool   [] Vomiting blood  [] Gastroesophageal reflux/heartburn   [] Abdominal pain Genitourinary:  [] Chronic kidney disease   [] Difficult urination  [] Frequent urination  [] Burning with urination   [] Hematuria Skin:  [] Rashes   [] Ulcers   [] Wounds Psychological:  [] History of anxiety   []  History of major depression.    Physical Exam BP 138/80 (BP Location: Right Arm)   Pulse 90   Resp 16   Ht 5\' 9"  (1.753 m)   Wt 216 lb (98 kg)   BMI 31.90 kg/m  Gen:  WD/WN, NAD Head: [place]/AT, No temporalis wasting.  Ear/Nose/Throat: Hearing grossly intact, nares w/o erythema or drainage, oropharynx w/o Erythema/Exudate Eyes: Conjunctiva clear, sclera non-icteric  Neck: trachea midline.  No JVD.  Pulmonary:  Good air movement, respirations not labored, no use of accessory muscles  Cardiac: RRR, no JVD Vascular:  Vessel Right Left  Radial Palpable Palpable                                   Gastrointestinal:. No masses, surgical incisions, or scars. Musculoskeletal: M/S 5/5 throughout.  Extremities without ischemic changes.  No deformity or atrophy. Trace LEedema. Neurologic: Sensation grossly intact in extremities.  Symmetrical.  Speech is fluent. Motor exam as listed above. Psychiatric: Judgment intact, Mood & affect appropriate for pt's clinical situation. Dermatologic: No rashes or ulcers noted.  No cellulitis or open wounds.    Radiology No results found.  Labs No results found for this or any previous visit (from the past 2160 hour(s)).  Assessment/Plan:  Hypertension blood pressure control important in reducing the progression of atherosclerotic disease. On appropriate oral  medications.   Hyperlipidemia lipid control important in reducing the progression of atherosclerotic disease. Continue statin therapy   Arthritis of right knee Needs a knee replacement and will be at very high risk for thromboembolic complications around that time.  Pulmonary embolus (HCC) The patient underwent knee replacement last year and had a bilateral pulmonary emboli after surgery.  Given this, he is at very high risk for future thromboembolic complications with his knee replacement later this month.  I would agree with IVC filter placement around the time of his knee replacement to remove the risk of major PE.  He still is at risk for DVT, and appropriate anticoagulation around that time should be continued as possible.  I discussed both the placement of the IVC filter as well as the removal.  The patient voices understanding and is agreeable to proceed.      Leotis Pain 12/08/2018, 3:49 PM   This note was created with Dragon medical transcription system.  Any errors from dictation are unintentional.

## 2018-12-08 NOTE — Assessment & Plan Note (Signed)
Needs a knee replacement and will be at very high risk for thromboembolic complications around that time.

## 2018-12-08 NOTE — Assessment & Plan Note (Signed)
The patient underwent knee replacement last year and had a bilateral pulmonary emboli after surgery.  Given this, he is at very high risk for future thromboembolic complications with his knee replacement later this month.  I would agree with IVC filter placement around the time of his knee replacement to remove the risk of major PE.  He still is at risk for DVT, and appropriate anticoagulation around that time should be continued as possible.  I discussed both the placement of the IVC filter as well as the removal.  The patient voices understanding and is agreeable to proceed.

## 2018-12-08 NOTE — Patient Instructions (Signed)
Inferior Vena Cava Filter Insertion Insertion of an inferior vena cava (IVC) filter is a procedure in which a filter is placed into the large vein in your abdomen that carries blood from the lower part of your body to your heart (inferior vena cava). This filter helps to prevent blood clots in the legs or pelvis from traveling to your lungs, which can be very dangerous. The filter is a small, metal device that is shaped like the spokes of an umbrella. The filter is inserted through a pathway that is created in the neck or groin. Filters are inserted when blood thinners (anticoagulants) cannot be used to prevent blood clots from forming. You may need filters rather than anticoagulants because you have:  Severe platelet problems or shortages.  Recent or current major bleeding that cannot be treated.  Bleeding associated with anticoagulants.  Recurrence of blood clots while taking anticoagulants.  A need for surgery in the near future.  Bleeding in your head.  Multiple broken bones. Tell a health care provider about:  Any allergies you have, including iodine.  All medicines you are taking, including vitamins, herbs, eye drops, creams, and over-the-counter medicines.  Any blood disorders you have.  Any problems you or family members have had with anesthetic medicines.  Any medical conditions you have.  Any surgeries you have had.  Whether you are pregnant or may be pregnant. What are the risks? Generally, this is a safe procedure. However, problems may occur, including:  The filter blocking the inferior vena cava. This can cause leg swelling.  The filter eventually failing and not working properly.  The filter moving and traveling to the heart or lungs.  Damage to the vein. This is rare.  Bleeding.  Allergic reactions to medicines or dyes.  Damage to other structures or organs.  Infection.  A pool of blood (hematoma) around the site where a flexible tube is put into a  large vein (catheter insertion site). What happens before the procedure? Staying hydrated Follow instructions from your health care provider about hydration, which may include:  Up to 2 hours before the procedure - you may continue to drink clear liquids, such as water, clear fruit juice, black coffee, and plain tea. Eating and drinking restrictions Follow instructions from your health care provider about eating and drinking, which may include:  8 hours before the procedure - stop eating heavy meals or foods such as meat, fried foods, or fatty foods.  6 hours before the procedure - stop eating light meals or foods, such as toast or cereal.  6 hours before the procedure - stop drinking milk or drinks that contain milk.  2 hours before the procedure - stop drinking clear liquids. Medicines   Ask your health care provider about: ? Changing or stopping your regular medicines. This is especially important if you are taking diabetes medicines or blood thinners. ? Taking medicines such as aspirin and ibuprofen. These medicines can thin your blood. Do not take these medicines before your procedure if your health care provider instructs you not to.  You may be given antibiotic medicine to help prevent infection. General instructions  Ask your health care provider how your surgical site will be marked or identified.  You may have blood tests. These tests can help to tell how well your kidneys and liver are working. They can also show how fast your blood is clotting.  You may be asked to shower with a germ-killing soap.  Plan to have someone take you   home from the hospital or clinic.  If you will be going home right after the procedure, plan to have someone with you for 24 hours.  Do not use any products that contain nicotine or tobacco, such as cigarettes and e-cigarettes. If you need help quitting, ask your health care provider. What happens during the procedure?  To lower your risk of  infection: ? Your health care team will wash or sanitize their hands. ? Your skin will be washed with soap. ? Hair may be removed from the surgical area.  An IV tube will be inserted into one of your veins.  You will be given one or more of the following: ? A medicine to help you relax (sedative). ? A medicine to numb the area (local anesthetic).  The procedure is done through a large vein in your neck or groin. A small cut (incision) will be made in this area.  A flexible tube (catheter) will be put into a large vein where the incision was made.  Contrast dye may be injected into the inferior vena cava to help guide the catheter.  X-rays may be used to make sure that the catheter is in the correct position.  The IVC filter will be inserted into the vein through the catheter until it reaches the correct location in the inferior vena cava.  The catheter will be removed.  Pressure will be applied to the insertion site to stop bleeding.  A bandage (dressing) may be applied over the catheter insertion site.  Your IV tube will be taken out. The procedure may vary among health care providers and hospitals. What happens after the procedure?  Your blood pressure, heart rate, breathing rate, and blood oxygen level will be monitored until the medicines you were given have worn off.  Your insertion site will be monitored for the first few hours for any signs of bleeding.  Do not drive for 24 hours if you were given a sedative. Summary  The inferior vena cava filter helps to prevent blood clots in the legs or pelvis from traveling to your lungs.  The IVC filter is inserted when anticoagulants cannot be used to prevent blood clots.  Plan to have someone take you home from the hospital or clinic, and plan to have someone with you for 24 hours if you will be going home right after the procedure. This information is not intended to replace advice given to you by your health care provider.  Make sure you discuss any questions you have with your health care provider. Document Released: 11/06/2005 Document Revised: 08/07/2016 Document Reviewed: 08/07/2016 Elsevier Interactive Patient Education  2019 [employer]  

## 2018-12-08 NOTE — Assessment & Plan Note (Signed)
blood pressure control important in reducing the progression of atherosclerotic disease. On appropriate oral medications.  

## 2018-12-08 NOTE — Assessment & Plan Note (Signed)
lipid control important in reducing the progression of atherosclerotic disease. Continue statin therapy  

## 2018-12-10 ENCOUNTER — Encounter (INDEPENDENT_AMBULATORY_CARE_PROVIDER_SITE_OTHER): Payer: Self-pay

## 2018-12-16 ENCOUNTER — Other Ambulatory Visit: Payer: Self-pay

## 2018-12-16 ENCOUNTER — Encounter
Admission: RE | Admit: 2018-12-16 | Discharge: 2018-12-16 | Disposition: A | Discharge status: Home / Self Care | Payer: Managed Care, Other (non HMO) | Source: Ambulatory Visit | Attending: Orthopedic Surgery | Admitting: Orthopedic Surgery

## 2018-12-16 DIAGNOSIS — Z01810 Encounter for preprocedural cardiovascular examination: Secondary | ICD-10-CM | POA: Diagnosis not present

## 2018-12-16 DIAGNOSIS — Z01818 Encounter for other preprocedural examination: Secondary | ICD-10-CM | POA: Diagnosis not present

## 2018-12-16 DIAGNOSIS — R9431 Abnormal electrocardiogram [ECG] [EKG]: Secondary | ICD-10-CM | POA: Diagnosis not present

## 2018-12-16 HISTORY — DX: Essential (primary) hypertension: I10

## 2018-12-16 HISTORY — DX: Dyspnea, unspecified: R06.00

## 2018-12-16 HISTORY — DX: Other pulmonary embolism without acute cor pulmonale: I26.99

## 2018-12-16 LAB — BASIC METABOLIC PANEL
Anion gap: 9 (ref 5–15)
BUN: 13 mg/dL (ref 8–23)
CALCIUM: 9.2 mg/dL (ref 8.9–10.3)
CO2: 29 mmol/L (ref 22–32)
CREATININE: 1.04 mg/dL (ref 0.61–1.24)
Chloride: 97 mmol/L — ABNORMAL LOW (ref 98–111)
GFR calc Af Amer: >60 mL/min (ref >60)
Glucose, Bld: 111 mg/dL — ABNORMAL HIGH (ref 70–99)
POTASSIUM: 4 mmol/L (ref 3.5–5.1)
SODIUM: 135 mmol/L (ref 135–145)

## 2018-12-16 LAB — PROTIME-INR
INR: 1.1 (ref 0.8–1.2)
PROTHROMBIN TIME: 14.1 s (ref 11.4–15.2)

## 2018-12-16 LAB — URINALYSIS, COMPLETE (UACMP) WITH MICROSCOPIC
BILIRUBIN URINE: NEGATIVE
Bacteria, UA: NONE SEEN
GLUCOSE, UA: NEGATIVE mg/dL
KETONES UR: NEGATIVE mg/dL
LEUKOCYTE UA: NEGATIVE
NITRITE: NEGATIVE
PH: 7 (ref 5.0–8.0)
Protein, ur: NEGATIVE mg/dL
SPECIFIC GRAVITY, URINE: 1.011 (ref 1.005–1.030)
SQUAMOUS EPITHELIAL / LPF: NONE SEEN (ref 0–5)

## 2018-12-16 LAB — CBC
HCT: 45.7 % (ref 39.0–52.0)
Hemoglobin: 14.5 g/dL (ref 13.0–17.0)
MCH: 27.8 pg (ref 26.0–34.0)
MCHC: 31.7 g/dL (ref 30.0–36.0)
MCV: 87.7 fL (ref 80.0–100.0)
PLATELETS: 258 10*3/uL (ref 150–400)
RBC: 5.21 MIL/uL (ref 4.22–5.81)
RDW: 14 % (ref 11.5–15.5)
WBC: 6.6 10*3/uL (ref 4.0–10.5)
nRBC: 0 % (ref 0.0–0.2)

## 2018-12-16 LAB — TYPE AND SCREEN
ABO/RH(D): O POS
Antibody Screen: NEGATIVE

## 2018-12-16 LAB — SEDIMENTATION RATE: Sed Rate: 10 mm/hr (ref 0–20)

## 2018-12-16 LAB — SURGICAL PCR SCREEN
MRSA, PCR: NEGATIVE
Staphylococcus aureus: NEGATIVE

## 2018-12-16 LAB — APTT: APTT: 32 s (ref 24–36)

## 2018-12-16 NOTE — Pre-Procedure Instructions (Signed)
Called Dr Menz's office for orders. 

## 2018-12-16 NOTE — Patient Instructions (Signed)
Your procedure is scheduled on: 12/29/2018 Tues Report to Same Day Surgery 2nd floor medical mall [facility] Entrance-take elevator on left to 2nd floor.  Check in with surgery information desk.) To find out your arrival time please call [phone] between 1PM - 3PM on 12/28/2018 Mon  Remember: Instructions that are not followed completely may result in serious medical risk, up to and including death, or upon the discretion of your surgeon and anesthesiologist your surgery may need to be rescheduled.    _x___ 1. Do not eat food after midnight the night before your procedure. You may drink clear liquids up to 2 hours before you are scheduled to arrive at the hospital for your procedure.  Do not drink clear liquids within 2 hours of your scheduled arrival to the hospital.  Clear liquids include  --Water or Apple juice without pulp  --Clear carbohydrate beverage such as ClearFast or Gatorade  --Black Coffee or Clear Tea (No milk, no creamers, do not add anything to                  the coffee or Tea Type 1 and type 2 diabetics should only drink water.   ____Ensure clear carbohydrate drink on the way to the hospital for bariatric patients  ____Ensure clear carbohydrate drink 3 hours before surgery for Dr Dwyane Luo patients if physician instructed.   No gum chewing or hard candies.     __x__ 2. No Alcohol for 24 hours before or after surgery.   __x__3. No Smoking or e-cigarettes for 24 prior to surgery.  Do not use any chewable tobacco products for at least 6 hour prior to surgery   ____  4. Bring all medications with you on the day of surgery if instructed.    __x__ 5. Notify your doctor if there is any change in your medical condition     (cold, fever, infections).    x___6. On the morning of surgery brush your teeth with toothpaste and water.  You may rinse your mouth with mouth wash if you wish.  Do not swallow any toothpaste or mouthwash.   Do not wear jewelry, make-up,  hairpins, clips or nail polish.  Do not wear lotions, powders, or perfumes. You may wear deodorant.  Do not shave 48 hours prior to surgery. Men may shave face and neck.  Do not bring valuables to the hospital.    [facility] is not responsible for any belongings or valuables.               Contacts, dentures or bridgework may not be worn into surgery.  Leave your suitcase in the car. After surgery it may be brought to your room.  For patients admitted to the hospital, discharge time is determined by your                       treatment team.  _  Patients discharged the day of surgery will not be allowed to drive home.  You will need someone to drive you home and stay with you the night of your procedure.    Please read over the following fact sheets that you were given:   [facility] Preparing for Surgery and or MRSA Information   _x___ Take anti-hypertensive listed below, cardiac, seizure, asthma,     anti-reflux and psychiatric medicines. These include:  1. diltiazem (CARDIZEM CD) 180 MG 24 hr capsule  2.omeprazole (PRILOSEC) 20 MG capsule  3.  4.  5.  6.  ____Fleets enema or Magnesium Citrate as directed.   _x___ Use CHG Soap or sage wipes as directed on instruction sheet   ____ Use inhalers on the day of surgery and bring to hospital day of surgery  ____ Stop Metformin and Janumet 2 days prior to surgery.    ____ Take 1/2 of usual insulin dose the night before surgery and none on the morning     surgery.   _x___ Follow recommendations from Cardiologist, Pulmonologist or PCP regarding          stopping Aspirin, Coumadin, Plavix ,Eliquis, Effient, or Pradaxa, and Pletal. Stop Eliquis 3 days before procedure per Dr Rudene Christians  X____Stop Anti-inflammatories such as Advil, Aleve, Ibuprofen, Motrin, Naproxen, Naprosyn, Goodies powders or aspirin products. OK to take Tylenol and                          Celebrex.   _x___ Stop supplements until after surgery.  But may continue Vitamin D,  Vitamin B,       and multivitamin.   ____ Bring C-Pap to the hospital.

## 2018-12-17 LAB — URINE CULTURE: CULTURE: NO GROWTH

## 2018-12-17 NOTE — Pre-Procedure Instructions (Signed)
Abnormal EKG, anesthesia RN notified. "Viewed by Dr Rosey Bath, no need for clearance." per [place]

## 2018-12-18 ENCOUNTER — Telehealth (INDEPENDENT_AMBULATORY_CARE_PROVIDER_SITE_OTHER): Payer: Self-pay

## 2018-12-18 NOTE — Telephone Encounter (Signed)
Patient called to let us know that his knee surgery has been postponed and wanted to know if his IVC filter was still a go. I explained that we will cancel it and reschedule when his knee surgery is rescheduled. Patient stated he will call back to reschedule his procedure when his surgery is rescheduled.

## 2018-12-24 ENCOUNTER — Encounter: Admission: RE | Payer: Self-pay | Source: Home / Self Care

## 2018-12-24 ENCOUNTER — Ambulatory Visit
Admission: RE | Admit: 2018-12-24 | Payer: Managed Care, Other (non HMO) | Source: Home / Self Care | Admitting: Vascular Surgery

## 2018-12-24 SURGERY — IVC FILTER INSERTION
Anesthesia: Moderate Sedation

## 2018-12-29 ENCOUNTER — Encounter: Admission: RE | Payer: Self-pay | Source: Home / Self Care

## 2018-12-29 ENCOUNTER — Inpatient Hospital Stay
Admission: RE | Admit: 2018-12-29 | Payer: Managed Care, Other (non HMO) | Source: Home / Self Care | Admitting: Orthopedic Surgery

## 2018-12-29 SURGERY — ARTHROPLASTY, KNEE, TOTAL
Anesthesia: Choice | Laterality: Right

## 2019-01-28 IMAGING — CT CT KNEE*L* W/O CM
3 of 6 series · 13 of 33 positions shown, 16 images · non-contrast
Comparison: None.

CLINICAL DATA: Constant left knee pain.  Osteoarthritis.

EXAM:
CT OF THE LEFT KNEE WITHOUT CONTRAST
TECHNIQUE: Multidetector CT imaging of the left knee was performed according to
the MY KNEE CT protocol. Multiplanar CT image reconstructions were
also generated. Axial images were obtained through the hip and
ankle.

[Series 4: coronal bone · coronal · 0.38mm/px · 1 of 102 slices shown]
[im 51/102  bone]
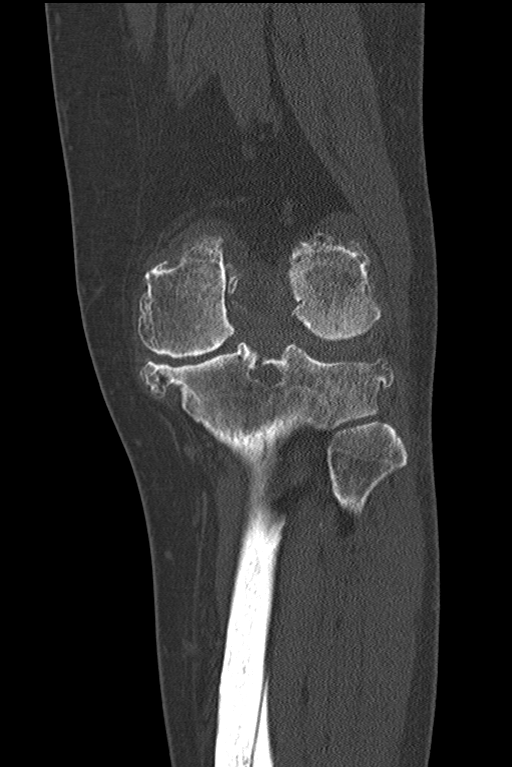

[Series 7: sagittal st · sagittal · 0.39mm/px · 5 of 99 slices shown, 6 images]
[im 33/99  bone]
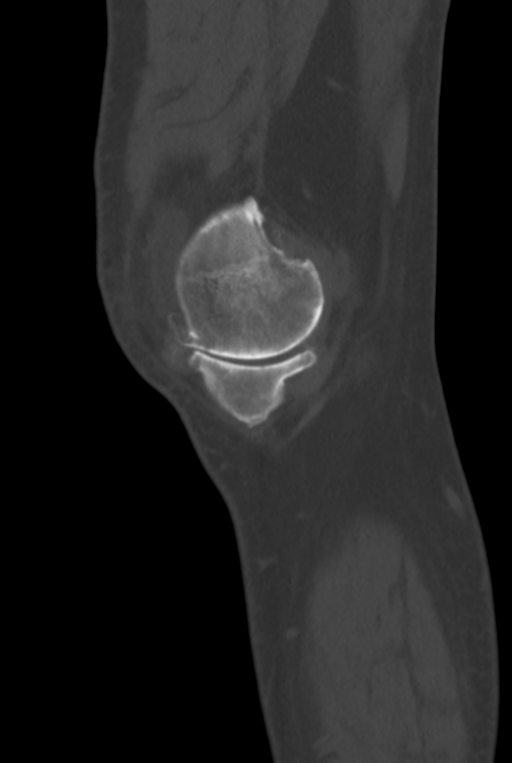
[im 41/99  bone]
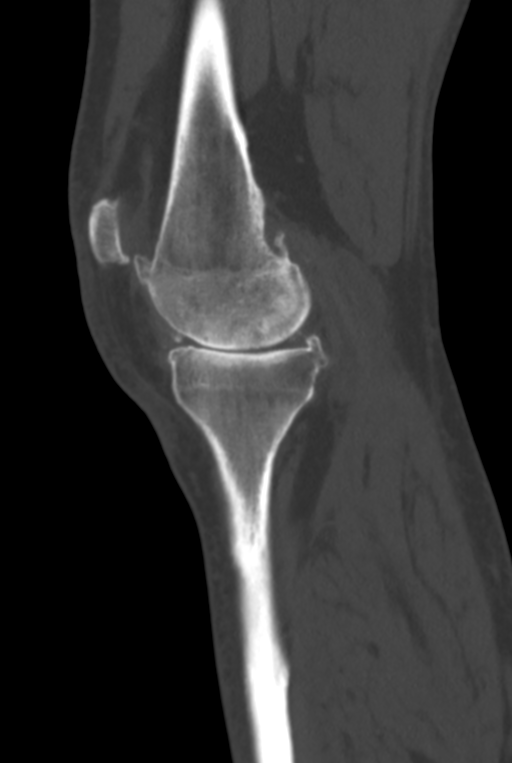
[im 50/99  soft-tissue]
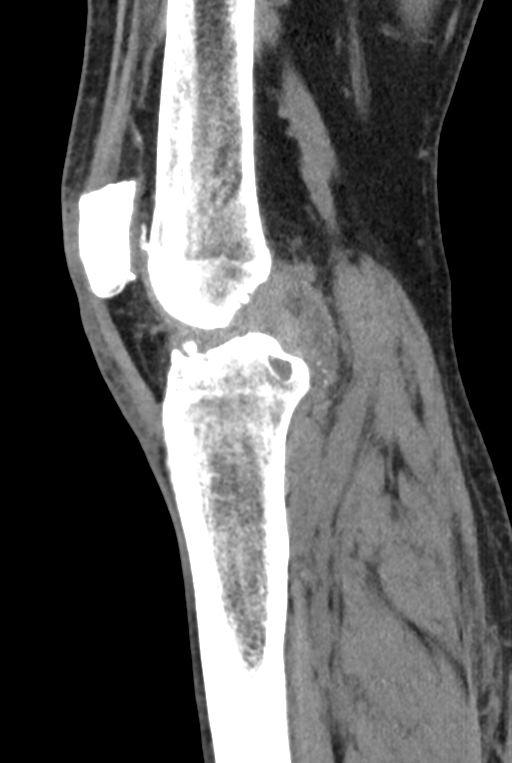
[im 50/99  bone]
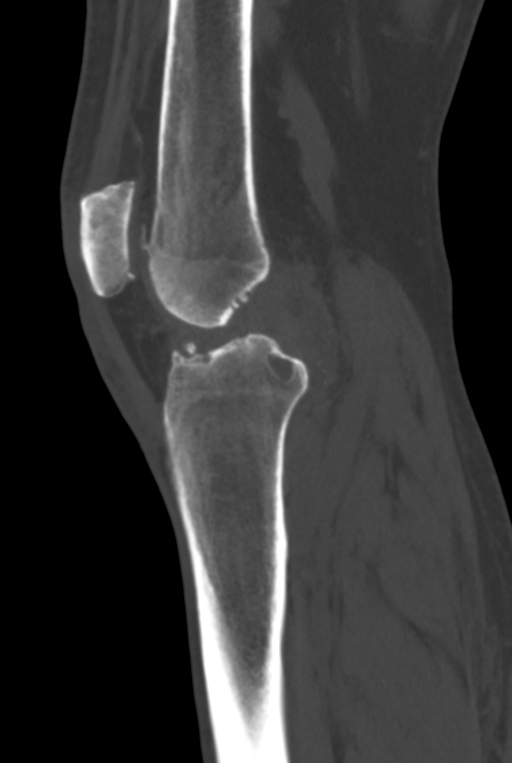
[im 58/99  bone]
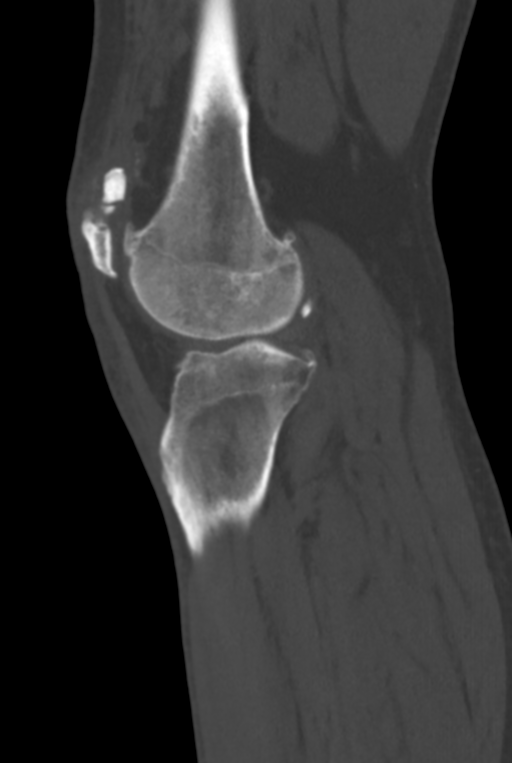
[im 66/99  bone]
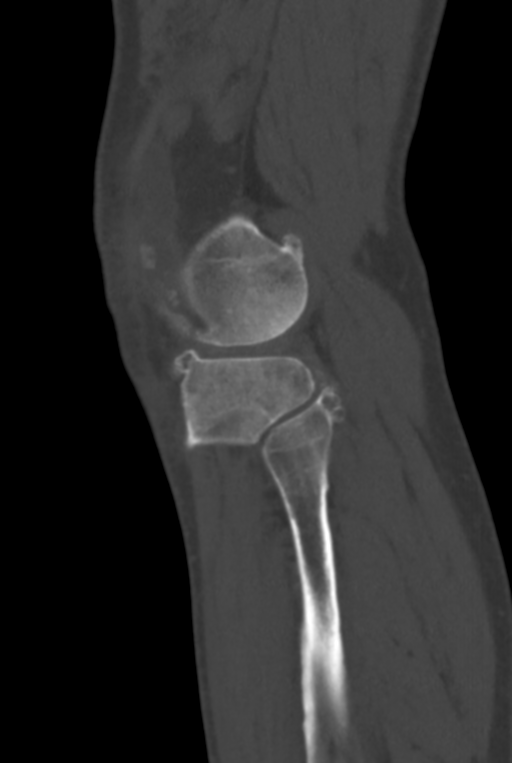

[Series 10: axial st · axial · 0.39mm/px · z∈[+492,+716]mm · 7 of 300 slices shown, 9 images]
[im 38/300  soft-tissue]
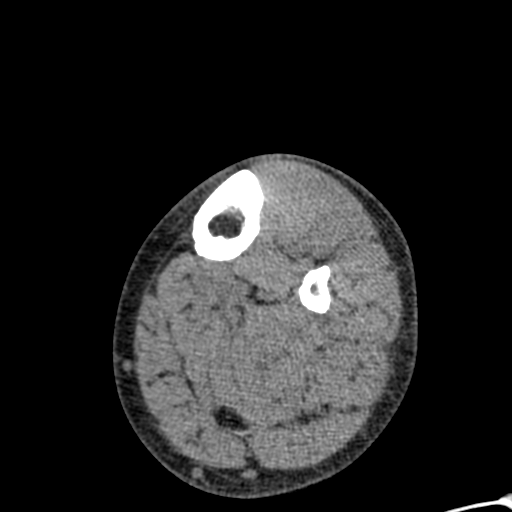
[im 38/300  bone]
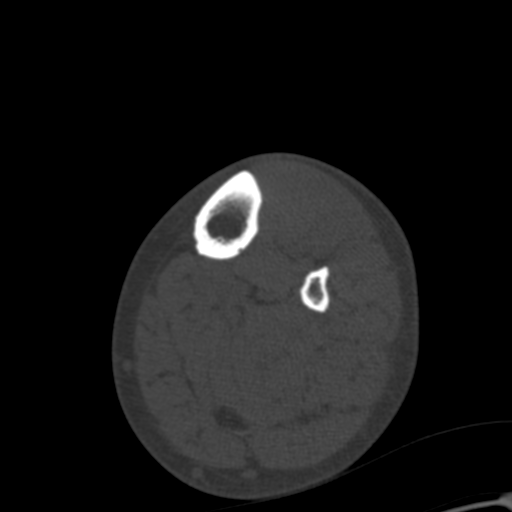
[im 75/300  bone]
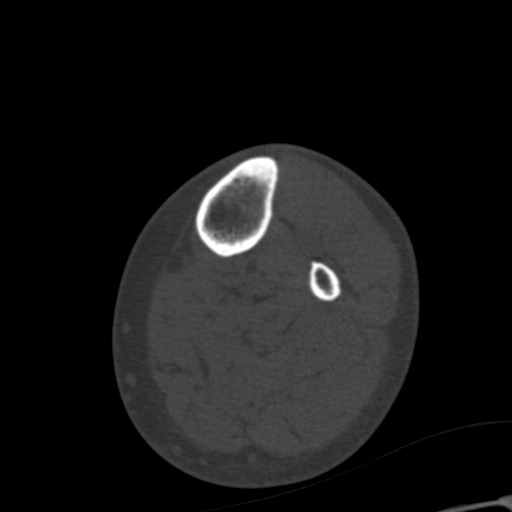
[im 113/300  bone]
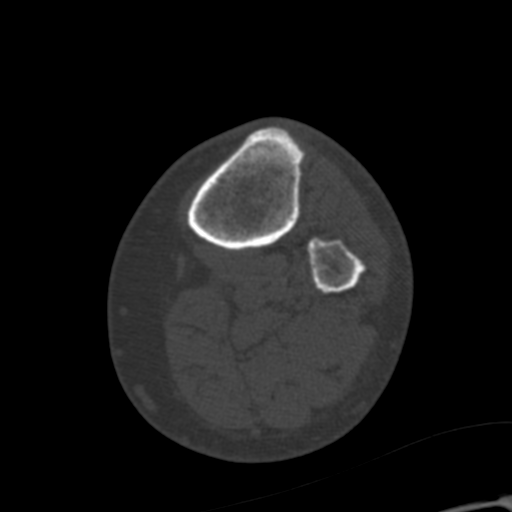
[im 150/300  bone]
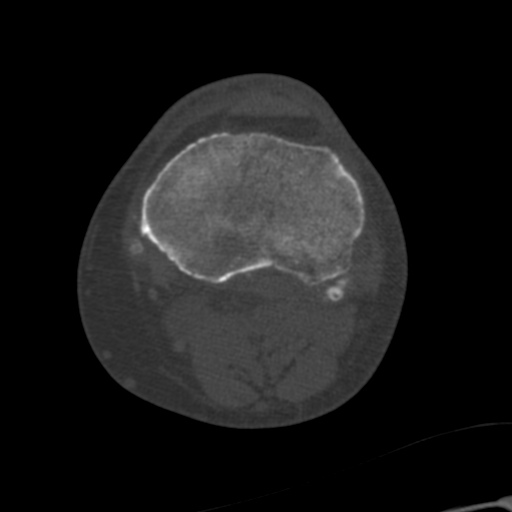
[im 187/300  soft-tissue]
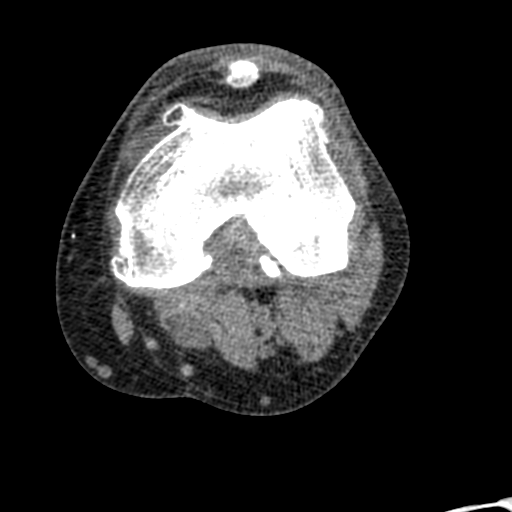
[im 187/300  bone]
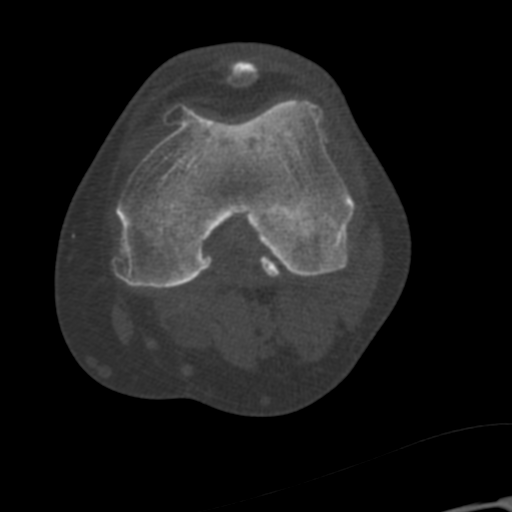
[im 225/300  bone]
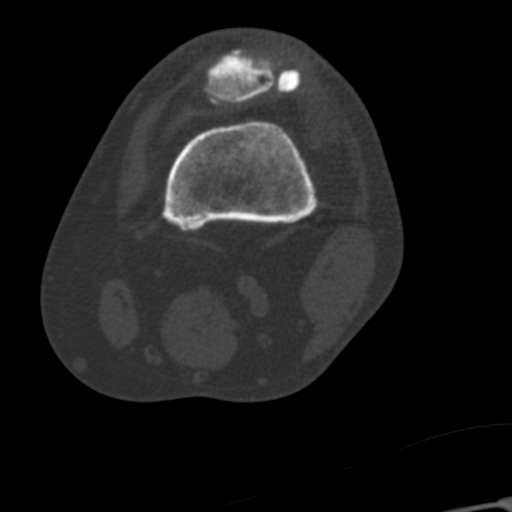
[im 262/300  bone]
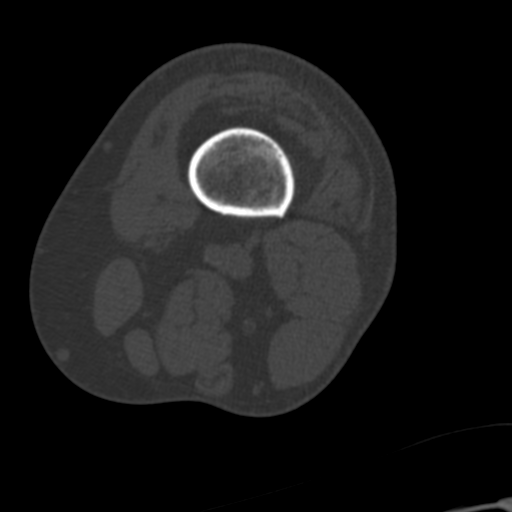

[13 of 33 positions shown; findings below may reference images not displayed]

FINDINGS: Bones/Joint/Cartilage

There are slight arthritic changes of the superolateral aspect of
the left hip acetabulum. No significant abnormality of the left
ankle.

Patient has severe tricompartmental osteoarthritis of the left knee.
There prominent marginal osteophytes as well as multiple ossified
loose bodies in the joint. Tripartite patella with focal cystic
degenerative changes at that site. Chondrocalcinosis.

Small joint effusion. Slight synovial calcification in the
suprapatellar recess.

Medial joint space narrowing with slight varus deformity.
IMPRESSION: Severe tricompartmental osteoarthritis of the left knee as
described. Slight varus deformity.

## 2019-02-02 ENCOUNTER — Other Ambulatory Visit: Payer: Self-pay | Admitting: *Deleted

## 2019-02-02 DIAGNOSIS — I2699 Other pulmonary embolism without acute cor pulmonale: Secondary | ICD-10-CM

## 2019-02-04 ENCOUNTER — Encounter (INDEPENDENT_AMBULATORY_CARE_PROVIDER_SITE_OTHER): Payer: Self-pay

## 2019-02-04 ENCOUNTER — Telehealth (INDEPENDENT_AMBULATORY_CARE_PROVIDER_SITE_OTHER): Payer: Self-pay

## 2019-02-04 MED ORDER — APIXABAN 5 MG PO TABS: 5.0000 mg | ORAL_TABLET | Freq: Two times a day (BID) | ORAL | 0 refills | Status: DC

## 2019-02-04 NOTE — Telephone Encounter (Signed)
Spoke with the patient about his IVC filter placement on 02/08/2019, 11:30 am arrival time with Dr. Lucky Cowboy. All pre-procedure instructions were gone over as well as the zero visitor policy. This will be mailed out as well.

## 2019-02-04 NOTE — Telephone Encounter (Signed)
Patient called back to let me know that his surgery with [facility] has been canceled so he needed to cancel his filter placement procedure. Patient will call back to reschedule when his surgery is rescheduled. The procedure has been canceled.

## 2019-02-04 NOTE — Telephone Encounter (Addendum)
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

## 2019-02-08 ENCOUNTER — Encounter: Admission: RE | Payer: Self-pay | Source: Home / Self Care

## 2019-02-08 ENCOUNTER — Ambulatory Visit
Admission: RE | Admit: 2019-02-08 | Payer: Managed Care, Other (non HMO) | Source: Home / Self Care | Admitting: Vascular Surgery

## 2019-02-08 SURGERY — IVC FILTER INSERTION
Anesthesia: Moderate Sedation

## 2019-02-16 ENCOUNTER — Telehealth (INDEPENDENT_AMBULATORY_CARE_PROVIDER_SITE_OTHER): Payer: Self-pay

## 2019-02-16 NOTE — Telephone Encounter (Signed)
Spoke with the patient and he is scheduled with Dr. Lucky Cowboy for a IVC filter placement on 02/23/2019 with a 2:00 pm arrival time. Patient was given his pre-procedure instructions and it was discussed him having Covid testing on 02/19/2019 between 26:71-24:58 pm. Hospital policy was also discussed.

## 2019-02-19 ENCOUNTER — Encounter
Admission: RE | Admit: 2019-02-19 | Discharge: 2019-02-19 | Disposition: A | Discharge status: Home / Self Care | Payer: Managed Care, Other (non HMO) | Source: Ambulatory Visit | Attending: Orthopedic Surgery | Admitting: Orthopedic Surgery

## 2019-02-19 ENCOUNTER — Other Ambulatory Visit: Payer: Self-pay

## 2019-02-19 DIAGNOSIS — Z1159 Encounter for screening for other viral diseases: Secondary | ICD-10-CM | POA: Diagnosis not present

## 2019-02-19 DIAGNOSIS — Z01812 Encounter for preprocedural laboratory examination: Secondary | ICD-10-CM | POA: Diagnosis not present

## 2019-02-19 HISTORY — DX: Other specified postprocedural states: Z98.890

## 2019-02-19 HISTORY — DX: Other specified postprocedural states: R11.2

## 2019-02-19 HISTORY — DX: Other complications of anesthesia, initial encounter: T88.59XA

## 2019-02-19 LAB — CBC
HCT: 42.3 % (ref 39.0–52.0)
Hemoglobin: 14.1 g/dL (ref 13.0–17.0)
MCH: 28.8 pg (ref 26.0–34.0)
MCHC: 33.3 g/dL (ref 30.0–36.0)
MCV: 86.5 fL (ref 80.0–100.0)
Platelets: 232 K/uL (ref 150–400)
RBC: 4.89 MIL/uL (ref 4.22–5.81)
RDW: 14 % (ref 11.5–15.5)
WBC: 5.4 K/uL (ref 4.0–10.5)
nRBC: 0 % (ref 0.0–0.2)

## 2019-02-19 LAB — TYPE AND SCREEN
ABO/RH(D): O POS
Antibody Screen: NEGATIVE

## 2019-02-19 LAB — URINALYSIS, ROUTINE W REFLEX MICROSCOPIC
Bacteria, UA: NONE SEEN
Bilirubin Urine: NEGATIVE
Glucose, UA: NEGATIVE mg/dL
Hgb urine dipstick: NEGATIVE
Ketones, ur: NEGATIVE mg/dL
Leukocytes,Ua: NEGATIVE
Nitrite: NEGATIVE
Protein, ur: NEGATIVE mg/dL
Specific Gravity, Urine: 1.009 (ref 1.005–1.030)
Squamous Epithelial / HPF: NONE SEEN (ref 0–5)
pH: 7 (ref 5.0–8.0)

## 2019-02-19 LAB — BASIC METABOLIC PANEL
Anion gap: 7 (ref 5–15)
BUN: 13 mg/dL (ref 8–23)
CO2: 26 mmol/L (ref 22–32)
Calcium: 9.1 mg/dL (ref 8.9–10.3)
Chloride: 101 mmol/L (ref 98–111)
Creatinine, Ser: 0.88 mg/dL (ref 0.61–1.24)
GFR calc Af Amer: >60 mL/min (ref >60)
GFR calc non Af Amer: >60 mL/min (ref >60)
Glucose, Bld: 111 mg/dL — ABNORMAL HIGH (ref 70–99)
Potassium: 4.1 mmol/L (ref 3.5–5.1)
Sodium: 134 mmol/L — ABNORMAL LOW (ref 135–145)

## 2019-02-19 LAB — APTT: aPTT: 33 seconds (ref 24–36)

## 2019-02-19 LAB — SURGICAL PCR SCREEN
MRSA, PCR: NEGATIVE
Staphylococcus aureus: NEGATIVE

## 2019-02-19 LAB — PROTIME-INR
INR: 1.2 (ref 0.8–1.2)
Prothrombin Time: 14.8 seconds (ref 11.4–15.2)

## 2019-02-19 LAB — SEDIMENTATION RATE: Sed Rate: 6 mm/hr (ref 0–20)

## 2019-02-19 NOTE — Patient Instructions (Addendum)
Your procedure is scheduled on: 03-02-19 TUESDAY Report to Same Day Surgery 2nd floor medical mall [facility] Entrance-take elevator on left to 2nd floor.  Check in with surgery information desk.) To find out your arrival time please call [phone] between 1PM - 3PM on 03-01-19 MONDAY  Remember: Instructions that are not followed completely may result in serious medical risk, up to and including death, or upon the discretion of your surgeon and anesthesiologist your surgery may need to be rescheduled.    _x___ 1. Do not eat food after midnight the night before your procedure. NO GUM OR CANDY AFTER MIDNIGHT. You may drink clear liquids up to 2 hours before you are scheduled to arrive at the hospital for your procedure.  Do not drink clear liquids within 2 hours of your scheduled arrival to the hospital.  Clear liquids include  --Water or Apple juice without pulp  --Clear carbohydrate beverage such as ClearFast or Gatorade  --Black Coffee or Clear Tea (No milk, no creamers, do not add anything to the coffee or Tea   ____Ensure clear carbohydrate drink on the way to the hospital for bariatric patients  ____Ensure clear carbohydrate drink 3 hours before surgery for Dr Dwyane Luo patients if physician instructed.    __x__ 2. No Alcohol for 24 hours before or after surgery.   __x__3. No Smoking or e-cigarettes for 24 prior to surgery.  Do not use any chewable tobacco products for at least 6 hour prior to surgery   ____  4. Bring all medications with you on the day of surgery if instructed.    __x__ 5. Notify your doctor if there is any change in your medical condition     (cold, fever, infections).    x___6. On the morning of surgery brush your teeth with toothpaste and water.  You may rinse your mouth with mouth wash if you wish.  Do not swallow any toothpaste or mouthwash.   Do not wear jewelry, make-up, hairpins, clips or nail polish.  Do not wear lotions, powders, or perfumes. You  may wear deodorant.  Do not shave 48 hours prior to surgery. Men may shave face and neck.  Do not bring valuables to the hospital.    [facility] is not responsible for any belongings or valuables.               Contacts, dentures or bridgework may not be worn into surgery.  Leave your suitcase in the car. After surgery it may be brought to your room.  For patients admitted to the hospital, discharge time is determined by your treatment team.  _  Patients discharged the day of surgery will not be allowed to drive home.  You will need someone to drive you home and stay with you the night of your procedure.    Please read over the following fact sheets that you were given:   [facility] Preparing for Surgery and or MRSA Information   _x___ TAKE THE FOLLOWING MEDICATION THE MORNING OF SURGERY WITH A SMALL SIP OF WATER. These include:  1. DILTIAZEM (CARDIZEM)  2. PRILOSEC (OMEPRAZOLE)  3. TAKE AN EXTRA PRILOSEC THE NIGHT BEFORE YOUR SURGERY  4.  5.  6.  ____Fleets enema or Magnesium Citrate as directed.   _x___ Use CHG Soap or sage wipes as directed on instruction sheet   ____ Use inhalers on the day of surgery and bring to hospital day of surgery  ____ Stop Metformin and Janumet 2 days prior to  surgery.    ____ Take 1/2 of usual insulin dose the night before surgery and none on the morning surgery.   _x___ Follow recommendations from Cardiologist, Pulmonologist or PCP regarding stopping Aspirin, Coumadin, Plavix ,Eliquis, Effient, or Pradaxa, and Pletal-PT HAS BEEN INSTRUCTED BY OFFICE TO STOP ELIQUIS 3 DAYS PRIOR TO SURGERY-LAST DOSE ON Friday, MAY 29TH  X____Stop Anti-inflammatories such as Advil, Aleve, Ibuprofen, Motrin, Naproxen, Naprosyn, Goodies powders or aspirin products NOW-OK to take Tylenol    ____ Stop supplements until after surgery.     ____ Bring C-Pap to the hospital.

## 2019-02-20 LAB — NOVEL CORONAVIRUS, NAA (HOSP ORDER, SEND-OUT TO REF LAB; TAT 18-24 HRS): SARS-CoV-2, NAA: NOT DETECTED

## 2019-02-20 LAB — URINE CULTURE: Culture: NO GROWTH

## 2019-02-22 ENCOUNTER — Other Ambulatory Visit (INDEPENDENT_AMBULATORY_CARE_PROVIDER_SITE_OTHER): Payer: Self-pay | Admitting: Nurse Practitioner

## 2019-02-22 MED ORDER — CLINDAMYCIN PHOSPHATE 300 MG/50ML IV SOLN
300.0000 mg | Freq: Once | INTRAVENOUS | Status: AC
  Administered 2019-02-23: 16:00:00 300 mg via INTRAVENOUS

## 2019-02-23 ENCOUNTER — Other Ambulatory Visit: Payer: Self-pay

## 2019-02-23 ENCOUNTER — Encounter: Payer: Self-pay | Admitting: *Deleted

## 2019-02-23 ENCOUNTER — Ambulatory Visit
Admission: RE | Admit: 2019-02-23 | Discharge: 2019-02-23 | Disposition: A | Discharge status: Home / Self Care | Payer: Managed Care, Other (non HMO) | Attending: Vascular Surgery | Admitting: Vascular Surgery

## 2019-02-23 ENCOUNTER — Encounter
Admission: RE | Disposition: A | Discharge status: Home / Self Care | Payer: Self-pay | Source: Home / Self Care | Attending: Vascular Surgery

## 2019-02-23 DIAGNOSIS — Z86718 Personal history of other venous thrombosis and embolism: Secondary | ICD-10-CM | POA: Diagnosis not present

## 2019-02-23 DIAGNOSIS — I10 Essential (primary) hypertension: Secondary | ICD-10-CM | POA: Diagnosis not present

## 2019-02-23 DIAGNOSIS — Z96652 Presence of left artificial knee joint: Secondary | ICD-10-CM | POA: Insufficient documentation

## 2019-02-23 DIAGNOSIS — Z8249 Family history of ischemic heart disease and other diseases of the circulatory system: Secondary | ICD-10-CM | POA: Insufficient documentation

## 2019-02-23 DIAGNOSIS — Z86711 Personal history of pulmonary embolism: Secondary | ICD-10-CM | POA: Insufficient documentation

## 2019-02-23 DIAGNOSIS — E785 Hyperlipidemia, unspecified: Secondary | ICD-10-CM | POA: Diagnosis not present

## 2019-02-23 DIAGNOSIS — I82409 Acute embolism and thrombosis of unspecified deep veins of unspecified lower extremity: Secondary | ICD-10-CM

## 2019-02-23 DIAGNOSIS — M1711 Unilateral primary osteoarthritis, right knee: Secondary | ICD-10-CM | POA: Insufficient documentation

## 2019-02-23 DIAGNOSIS — Z87891 Personal history of nicotine dependence: Secondary | ICD-10-CM | POA: Diagnosis not present

## 2019-02-23 DIAGNOSIS — Z79899 Other long term (current) drug therapy: Secondary | ICD-10-CM

## 2019-02-23 HISTORY — PX: IVC FILTER INSERTION: CATH118245

## 2019-02-23 SURGERY — IVC FILTER INSERTION
Anesthesia: Moderate Sedation

## 2019-02-23 MED ORDER — METHYLPREDNISOLONE SODIUM SUCC 125 MG IJ SOLR
INTRAMUSCULAR | Status: AC
  Administered 2019-02-23: 15:00:00 125 mg via INTRAVENOUS
  Filled 2019-02-23: qty 2

## 2019-02-23 MED ORDER — CLINDAMYCIN PHOSPHATE 300 MG/50ML IV SOLN
INTRAVENOUS | Status: AC
  Administered 2019-02-23: 16:00:00 300 mg via INTRAVENOUS
  Filled 2019-02-23: qty 50

## 2019-02-23 MED ORDER — FAMOTIDINE 20 MG PO TABS
40.0000 mg | ORAL_TABLET | Freq: Once | ORAL | Status: AC | PRN
  Administered 2019-02-23: 15:00:00 40 mg via ORAL

## 2019-02-23 MED ORDER — DIPHENHYDRAMINE HCL 50 MG/ML IJ SOLN
50.0000 mg | Freq: Once | INTRAMUSCULAR | Status: AC | PRN
  Administered 2019-02-23: 25 mg via INTRAVENOUS

## 2019-02-23 MED ORDER — SODIUM CHLORIDE 0.9 % IV SOLN
INTRAVENOUS | Status: DC
  Administered 2019-02-23: 15:00:00 via INTRAVENOUS

## 2019-02-23 MED ORDER — MIDAZOLAM HCL 2 MG/2ML IJ SOLN
INTRAMUSCULAR | Status: DC | PRN
  Administered 2019-02-23: 2 mg via INTRAVENOUS

## 2019-02-23 MED ORDER — METHYLPREDNISOLONE SODIUM SUCC 125 MG IJ SOLR
125.0000 mg | Freq: Once | INTRAMUSCULAR | Status: AC | PRN
  Administered 2019-02-23: 125 mg via INTRAVENOUS

## 2019-02-23 MED ORDER — FENTANYL CITRATE (PF) 100 MCG/2ML IJ SOLN
INTRAMUSCULAR | Status: DC | PRN
  Administered 2019-02-23: 50 ug via INTRAVENOUS

## 2019-02-23 MED ORDER — HYDROMORPHONE HCL 1 MG/ML IJ SOLN: 1.0000 mg | Freq: Once | INTRAMUSCULAR | Status: DC | PRN

## 2019-02-23 MED ORDER — ONDANSETRON HCL 4 MG/2ML IJ SOLN: 4.0000 mg | Freq: Four times a day (QID) | INTRAMUSCULAR | Status: DC | PRN

## 2019-02-23 MED ORDER — FAMOTIDINE 20 MG PO TABS
ORAL_TABLET | ORAL | Status: AC
  Administered 2019-02-23: 15:00:00 40 mg via ORAL
  Filled 2019-02-23: qty 2

## 2019-02-23 MED ORDER — FENTANYL CITRATE (PF) 100 MCG/2ML IJ SOLN
INTRAMUSCULAR | Status: AC
  Filled 2019-02-23: qty 2

## 2019-02-23 MED ORDER — IOHEXOL 300 MG/ML  SOLN
INTRAMUSCULAR | Status: DC | PRN
  Administered 2019-02-23: 16:00:00 15 mL via INTRA_ARTERIAL

## 2019-02-23 MED ORDER — MIDAZOLAM HCL 5 MG/5ML IJ SOLN
INTRAMUSCULAR | Status: AC
  Filled 2019-02-23: qty 5

## 2019-02-23 MED ORDER — MIDAZOLAM HCL 2 MG/ML PO SYRP: 8.0000 mg | ORAL_SOLUTION | Freq: Once | ORAL | Status: DC | PRN

## 2019-02-23 MED ORDER — DIPHENHYDRAMINE HCL 50 MG/ML IJ SOLN
INTRAMUSCULAR | Status: AC
  Administered 2019-02-23: 15:00:00 25 mg via INTRAVENOUS
  Filled 2019-02-23: qty 1

## 2019-02-23 SURGICAL SUPPLY — 3 items
FILTER VC CELECT-FEMORAL (Filter) ×2 IMPLANT
PACK ANGIOGRAPHY (CUSTOM PROCEDURE TRAY) ×2 IMPLANT
WIRE J 3MM .035X145CM (WIRE) ×2 IMPLANT

## 2019-02-23 NOTE — H&P (Signed)
[facility] Admission History & Physical  MRN : [id number]  Johnny Freeman is a 68 y.o. (1950-11-28) male who presents with chief complaint of No chief complaint on file. Marland Kitchen  History of Present Illness: Patient presents today for placement of his IVC filter.  This was scheduled about 2 months ago, but got delayed because his knee replacement got delayed due to coronavirus restrictions.  The patient reports with his left knee replacement last year he developed perioperative DVT and PE.  This was then treated with appropriate anticoagulation therapy which he tolerated well. No current issues, but he is scheduled to have a right knee replacement at the end of the month.  Due to his previous DVT and PE with knee replacement he is very high risk for further thromboembolic events and his orthopedist has recommended he have an IVC filter placed preliminary to his next knee replacement.   Current Facility-Administered Medications  Medication Dose Route Frequency Provider Last Rate Last Dose  . fentaNYL (SUBLIMAZE) 100 MCG/2ML injection           . midazolam (VERSED) 5 MG/5ML injection           . 0.9 %  sodium chloride infusion   Intravenous Continuous Eulogio Ditch E, NP      . clindamycin (CLEOCIN) 300 MG/50ML IVPB           . clindamycin (CLEOCIN) IVPB 300 mg  300 mg Intravenous Once Eulogio Ditch E, NP      . diphenhydrAMINE (BENADRYL) 50 MG/ML injection           . diphenhydrAMINE (BENADRYL) injection 50 mg  50 mg Intravenous Once PRN Eulogio Ditch E, NP      . HYDROmorphone (DILAUDID) injection 1 mg  1 mg Intravenous Once PRN Kris Hartmann, NP      . midazolam (VERSED) 2 MG/ML syrup 8 mg  8 mg Oral Once PRN Kris Hartmann, NP      . ondansetron [facility]) injection 4 mg  4 mg Intravenous Q6H PRN Kris Hartmann, NP        Past Medical History:  Diagnosis Date  . Anxiety   . Cancer [facility])    prostate 2011  . Complication of anesthesia   . Dyspnea   . GERD  (gastroesophageal reflux disease)   . Headache    migraines  . Hypertension   . PE (pulmonary thromboembolism) ([place])   . PONV (postoperative nausea and vomiting)    nauseated after prostate bx HIFU    Past Surgical History:  Procedure Laterality Date  . CYSTOSCOPY N/A 11/04/2017   Procedure: CYSTOSCOPY;  Surgeon: Hessie Knows, MD;  Location: [facility] ORS;  Service: Orthopedics;  Laterality: N/A;  . PROSTATE SURGERY    . ROTATOR CUFF REPAIR Right 2003  . TOTAL KNEE ARTHROPLASTY Left 11/04/2017   Procedure: TOTAL KNEE ARTHROPLASTY;  Surgeon: Hessie Knows, MD;  Location: [facility] ORS;  Service: Orthopedics;  Laterality: Left;    Family History Family History  Problem Relation Age of Onset  . Heart attack Father   No bleeding disorders, clotting disorders, or aneurysms  Social History Social History        Tobacco Use  . Smoking status: Former Smoker    Last attempt to quit: 10/03/1972    Years since quitting: 46.2  . Smokeless tobacco: Never Used  Substance Use Topics  . Alcohol use: No    Alcohol/week: 0.0 standard drinks    Frequency: Never  .  Drug use: No     Allergies  Allergen Reactions  . Shellfish Allergy Swelling    Swelling in eyes       REVIEW OF SYSTEMS (Negative unless checked)  Constitutional: [] ?Weight loss  [] ?Fever  [] ?Chills Cardiac: [] ?Chest pain   [] ?Chest pressure   [] ?Palpitations   [] ?Shortness of breath when laying flat   [] ?Shortness of breath at rest   [] ?Shortness of breath with exertion. Vascular:  [x] ?Pain in legs with walking   [x] ?Pain in legs at rest   [] ?Pain in legs when laying flat   [] ?Claudication   [] ?Pain in feet when walking  [] ?Pain in feet at rest  [] ?Pain in feet when laying flat   [x] ?History of DVT   [] ?Phlebitis   [] ?Swelling in legs   [] ?Varicose veins   [] ?Non-healing ulcers Pulmonary:   [] ?Uses home oxygen   [] ?Productive cough   [] ?Hemoptysis   [] ?Wheeze  [] ?COPD   [] ?Asthma Neurologic:  [] ?Dizziness   [] ?Blackouts   [] ?Seizures   [] ?History of stroke   [] ?History of TIA  [] ?Aphasia   [] ?Temporary blindness   [] ?Dysphagia   [] ?Weakness or numbness in arms   [] ?Weakness or numbness in legs Musculoskeletal:  [x] ?Arthritis   [] ?Joint swelling   [x] ?Joint pain   [] ?Low back pain Hematologic:  [] ?Easy bruising  [] ?Easy bleeding   [] ?Hypercoagulable state   [] ?Anemic  [] ?Hepatitis Gastrointestinal:  [] ?Blood in stool   [] ?Vomiting blood  [] ?Gastroesophageal reflux/heartburn   [] ?Abdominal pain Genitourinary:  [] ?Chronic kidney disease   [] ?Difficult urination  [] ?Frequent urination  [] ?Burning with urination   [] ?Hematuria Skin:  [] ?Rashes   [] ?Ulcers   [] ?Wounds Psychological:  [] ?History of anxiety   [] ? History of major depression.     Physical Examination  Vitals:   02/23/19 1442  BP: (!) 147/86  Pulse: 83  Resp: (!) 23  Temp: 98.1 F (36.7 C)  TempSrc: Oral  SpO2: 97%  Weight: 95.3 kg  Height: 5\' 8"  (1.727 m)   Body mass index is 31.93 kg/m. Gen: WD/WN, NAD Head: [place]/AT, No temporalis wasting.  Ear/Nose/Throat: Hearing grossly intact, nares w/o erythema or drainage, oropharynx w/o Erythema/Exudate,  Eyes: Conjunctiva clear, sclera non-icteric Neck: Trachea midline.  No JVD.  Pulmonary:  Good air movement, respirations not labored, no use of accessory muscles.  Cardiac: RRR, normal S1, S2. Vascular:  Vessel Right Left  Radial Palpable Palpable   Musculoskeletal: M/S 5/5 throughout.  Extremities without ischemic changes.  No deformity or atrophy.  Trace bilateral lower extremity edema Neurologic: Sensation grossly intact in extremities.  Symmetrical.  Speech is fluent. Motor exam as listed above. Psychiatric: Judgment intact, Mood & affect appropriate for pt's clinical situation. Dermatologic: No rashes or ulcers noted.  No cellulitis or open wounds.      CBC Lab Results  Component Value Date   WBC 5.4 02/19/2019   HGB 14.1 02/19/2019   HCT 42.3 02/19/2019   MCV  86.5 02/19/2019   PLT 232 02/19/2019    BMET    Component Value Date/Time   NA 134 (L) 02/19/2019 1023   NA 135 07/08/2016 0815   K 4.1 02/19/2019 1023   CL 101 02/19/2019 1023   CO2 26 02/19/2019 1023   GLUCOSE 111 (H) 02/19/2019 1023   BUN 13 02/19/2019 1023   BUN 12 07/08/2016 0815   CREATININE 0.88 02/19/2019 1023   CALCIUM 9.1 02/19/2019 1023   GFRNONAA >60 02/19/2019 1023   GFRAA >60 02/19/2019 1023   Estimated Creatinine Clearance: 88.8 mL/min (by C-G formula  based on SCr of 0.88 mg/dL).  COAG Lab Results  Component Value Date   INR 1.2 02/19/2019   INR 1.1 12/16/2018   INR 1.10 11/07/2017    Radiology No results found.    Assessment/Plan  Hypertension blood pressure control important in reducing the progression of atherosclerotic disease. On appropriate oral medications.   Hyperlipidemia lipid control important in reducing the progression of atherosclerotic disease. Continue statin therapy   Arthritis of right knee Needs a knee replacement and will be at very high risk for thromboembolic complications around that time.  Pulmonary embolus (HCC) The patient underwent knee replacement last year and had a bilateral pulmonary emboli after surgery.  Given this, he is at very high risk for future thromboembolic complications with his knee replacement later this month.  I would agree with IVC filter placement around the time of his knee replacement to remove the risk of major PE.  He still is at risk for DVT, and appropriate anticoagulation around that time should be continued as possible.  I discussed both the placement of the IVC filter as well as the removal.  The patient voices understanding and is agreeable to proceed.   Leotis Pain, MD  02/23/2019 2:52 PM

## 2019-02-23 NOTE — Op Note (Signed)
[facility]   OPERATIVE NOTE    PRE-OPERATIVE DIAGNOSIS: previous DVT/PE, upcoming knee replacement  POST-OPERATIVE DIAGNOSIS: same as above  PROCEDURE: 1.   Ultrasound guidance for vascular access to the right femoral vein 2.   Catheter placement into the inferior vena cava 3.   Inferior venacavogram 4.   Placement of a Cook Celect IVC filter  SURGEON: Leotis Pain, MD  ASSISTANT(S): None  ANESTHESIA: local with Moderate Conscious Sedation for approximately 15 minutes using 2 mg of Versed and 50 mcg of Fentanyl  ESTIMATED BLOOD LOSS: minimal  CONTRAST: 15 cc  FLUORO TIME: less than one minute  FINDING(S): 1.  Patent IVC  SPECIMEN(S):  none  INDICATIONS:   Johnny Freeman is a 68 y.o. male who presents with previous history of DVT and PE after knee surgery. He has an upcoming knee replacement and is high risk for PE.  Inferior vena cava filter is indicated for this reason.  Risks and benefits including filter thrombosis, migration, fracture, bleeding, and infection were all discussed.  We discussed that all IVC filters that we place can be removed if desired from the patient once the need for the filter has passed.    DESCRIPTION: After obtaining full informed written consent, the patient was brought back to the vascular suite. The skin was sterilely prepped and draped in a sterile surgical field was created. Moderate conscious sedation was administered during a face to face encounter with the patient throughout the procedure with my supervision of the RN administering medicines and monitoring the patient's vital signs, pulse oximetry, telemetry and mental status throughout from the start of the procedure until the patient was taken to the recovery room. The right femoral vein was accessed under direct ultrasound guidance without difficulty with a Seldinger needle and a J-wire was then placed. After skin nick and dilatation, the delivery sheath was placed  into the inferior vena cava and an inferior venacavogram was performed. This demonstrated a patent IVC with the level of the renal veins at L1.  The filter was then deployed into the inferior vena cava at the level of the bottom or L1 just below the renal veins. The delivery sheath was then removed. Pressure was held. Sterile dressings were placed. The patient tolerated the procedure well and was taken to the recovery room in stable condition.  COMPLICATIONS: None  CONDITION: Stable  Leotis Pain  02/23/2019, 3:43 PM   This note was created with Dragon Medical transcription system. Any errors in dictation are purely unintentional.

## 2019-02-24 ENCOUNTER — Encounter: Payer: Self-pay | Admitting: Vascular Surgery

## 2019-02-26 ENCOUNTER — Other Ambulatory Visit: Payer: Managed Care, Other (non HMO)

## 2019-03-01 ENCOUNTER — Other Ambulatory Visit: Payer: Self-pay

## 2019-03-01 ENCOUNTER — Encounter
Admission: RE | Admit: 2019-03-01 | Discharge: 2019-03-01 | Disposition: A | Discharge status: Home / Self Care | Payer: Managed Care, Other (non HMO) | Source: Ambulatory Visit | Attending: Orthopedic Surgery | Admitting: Orthopedic Surgery

## 2019-03-01 LAB — SARS CORONAVIRUS 2 BY RT PCR (HOSPITAL ORDER, PERFORMED IN ~~LOC~~ HOSPITAL LAB): SARS Coronavirus 2: NEGATIVE

## 2019-03-02 ENCOUNTER — Encounter
Admission: RE | Disposition: A | Discharge status: Home Health | Payer: Self-pay | Source: Home / Self Care | Attending: Orthopedic Surgery

## 2019-03-02 ENCOUNTER — Inpatient Hospital Stay
Admission: RE | Admit: 2019-03-02 | Discharge: 2019-03-04 | DRG: 470 | Disposition: A | Discharge status: Home Health | Payer: Managed Care, Other (non HMO) | Attending: Orthopedic Surgery | Admitting: Orthopedic Surgery

## 2019-03-02 ENCOUNTER — Other Ambulatory Visit: Payer: Self-pay

## 2019-03-02 ENCOUNTER — Inpatient Hospital Stay: Payer: Managed Care, Other (non HMO) | Admitting: Anesthesiology

## 2019-03-02 ENCOUNTER — Inpatient Hospital Stay: Payer: Managed Care, Other (non HMO)

## 2019-03-02 DIAGNOSIS — Z86718 Personal history of other venous thrombosis and embolism: Secondary | ICD-10-CM

## 2019-03-02 DIAGNOSIS — Z91013 Allergy to seafood: Secondary | ICD-10-CM | POA: Diagnosis not present

## 2019-03-02 DIAGNOSIS — M1711 Unilateral primary osteoarthritis, right knee: Principal | ICD-10-CM | POA: Diagnosis present

## 2019-03-02 DIAGNOSIS — Z96651 Presence of right artificial knee joint: Secondary | ICD-10-CM

## 2019-03-02 DIAGNOSIS — Z87891 Personal history of nicotine dependence: Secondary | ICD-10-CM | POA: Diagnosis not present

## 2019-03-02 DIAGNOSIS — Z8546 Personal history of malignant neoplasm of prostate: Secondary | ICD-10-CM | POA: Diagnosis not present

## 2019-03-02 DIAGNOSIS — Z7982 Long term (current) use of aspirin: Secondary | ICD-10-CM

## 2019-03-02 DIAGNOSIS — Z1159 Encounter for screening for other viral diseases: Secondary | ICD-10-CM | POA: Diagnosis not present

## 2019-03-02 DIAGNOSIS — Z8249 Family history of ischemic heart disease and other diseases of the circulatory system: Secondary | ICD-10-CM | POA: Diagnosis not present

## 2019-03-02 DIAGNOSIS — F419 Anxiety disorder, unspecified: Secondary | ICD-10-CM | POA: Diagnosis present

## 2019-03-02 DIAGNOSIS — K219 Gastro-esophageal reflux disease without esophagitis: Secondary | ICD-10-CM | POA: Diagnosis present

## 2019-03-02 DIAGNOSIS — E785 Hyperlipidemia, unspecified: Secondary | ICD-10-CM | POA: Diagnosis present

## 2019-03-02 DIAGNOSIS — I2510 Atherosclerotic heart disease of native coronary artery without angina pectoris: Secondary | ICD-10-CM | POA: Diagnosis present

## 2019-03-02 DIAGNOSIS — Z96652 Presence of left artificial knee joint: Secondary | ICD-10-CM | POA: Diagnosis present

## 2019-03-02 DIAGNOSIS — Z7901 Long term (current) use of anticoagulants: Secondary | ICD-10-CM | POA: Diagnosis not present

## 2019-03-02 DIAGNOSIS — G8918 Other acute postprocedural pain: Secondary | ICD-10-CM

## 2019-03-02 DIAGNOSIS — M25561 Pain in right knee: Secondary | ICD-10-CM | POA: Diagnosis present

## 2019-03-02 DIAGNOSIS — Z6831 Body mass index (BMI) 31.0-31.9, adult: Secondary | ICD-10-CM

## 2019-03-02 DIAGNOSIS — Z79899 Other long term (current) drug therapy: Secondary | ICD-10-CM

## 2019-03-02 DIAGNOSIS — Z86711 Personal history of pulmonary embolism: Secondary | ICD-10-CM | POA: Diagnosis not present

## 2019-03-02 DIAGNOSIS — I10 Essential (primary) hypertension: Secondary | ICD-10-CM | POA: Diagnosis present

## 2019-03-02 DIAGNOSIS — E669 Obesity, unspecified: Secondary | ICD-10-CM | POA: Diagnosis present

## 2019-03-02 HISTORY — PX: TOTAL KNEE ARTHROPLASTY: SHX125

## 2019-03-02 SURGERY — ARTHROPLASTY, KNEE, TOTAL
Anesthesia: Spinal | Site: Knee | Laterality: Right

## 2019-03-02 MED ORDER — ADULT MULTIVITAMIN W/MINERALS CH
1.0000 | ORAL_TABLET | Freq: Every day | ORAL | Status: DC
  Administered 2019-03-03 – 2019-03-04 (×2): 1 via ORAL
  Filled 2019-03-02 (×2): qty 1

## 2019-03-02 MED ORDER — OXYCODONE HCL 5 MG PO TABS
5.0000 mg | ORAL_TABLET | ORAL | Status: DC | PRN
  Administered 2019-03-02 (×2): 5 mg via ORAL
  Administered 2019-03-03 (×2): 10 mg via ORAL
  Administered 2019-03-03: 5 mg via ORAL
  Administered 2019-03-04: 10 mg via ORAL
  Filled 2019-03-02: qty 2
  Filled 2019-03-02: qty 1
  Filled 2019-03-02 (×3): qty 2
  Filled 2019-03-02: qty 1

## 2019-03-02 MED ORDER — MIDAZOLAM HCL 2 MG/2ML IJ SOLN
INTRAMUSCULAR | Status: AC
  Filled 2019-03-02: qty 2

## 2019-03-02 MED ORDER — DOCUSATE SODIUM 100 MG PO CAPS
100.0000 mg | ORAL_CAPSULE | Freq: Two times a day (BID) | ORAL | Status: DC
  Administered 2019-03-02 – 2019-03-04 (×4): 100 mg via ORAL
  Filled 2019-03-02 (×4): qty 1

## 2019-03-02 MED ORDER — ONDANSETRON HCL 4 MG/2ML IJ SOLN
4.0000 mg | Freq: Four times a day (QID) | INTRAMUSCULAR | Status: DC | PRN
  Filled 2019-03-02: qty 2

## 2019-03-02 MED ORDER — MIDAZOLAM HCL 5 MG/5ML IJ SOLN
INTRAMUSCULAR | Status: DC | PRN
  Administered 2019-03-02: 2 mg via INTRAVENOUS

## 2019-03-02 MED ORDER — CEFAZOLIN SODIUM-DEXTROSE 2-4 GM/100ML-% IV SOLN
INTRAVENOUS | Status: AC
  Filled 2019-03-02: qty 100

## 2019-03-02 MED ORDER — TRIAMCINOLONE ACETONIDE 0.1 % EX CREA
1.0000 "application " | TOPICAL_CREAM | Freq: Two times a day (BID) | CUTANEOUS | Status: DC | PRN
  Filled 2019-03-02: qty 15

## 2019-03-02 MED ORDER — MAGNESIUM HYDROXIDE 400 MG/5ML PO SUSP
30.0000 mL | Freq: Every day | ORAL | Status: DC | PRN
  Administered 2019-03-03 (×2): 30 mL via ORAL
  Filled 2019-03-02 (×2): qty 30

## 2019-03-02 MED ORDER — KETOROLAC TROMETHAMINE 30 MG/ML IJ SOLN
INTRAMUSCULAR | Status: DC | PRN
  Administered 2019-03-02: 30 mg

## 2019-03-02 MED ORDER — BUPIVACAINE-EPINEPHRINE (PF) 0.25% -1:200000 IJ SOLN
INTRAMUSCULAR | Status: AC
  Filled 2019-03-02: qty 30

## 2019-03-02 MED ORDER — SODIUM CHLORIDE 0.9 % IV SOLN
INTRAVENOUS | Status: DC | PRN
  Administered 2019-03-02: 08:00:00 25 ug/min via INTRAVENOUS

## 2019-03-02 MED ORDER — DIPHENHYDRAMINE HCL 12.5 MG/5ML PO ELIX: 12.5000 mg | ORAL_SOLUTION | ORAL | Status: DC | PRN

## 2019-03-02 MED ORDER — BUPIVACAINE-EPINEPHRINE (PF) 0.25% -1:200000 IJ SOLN
INTRAMUSCULAR | Status: DC | PRN
  Administered 2019-03-02: 30 mL

## 2019-03-02 MED ORDER — SODIUM CHLORIDE (PF) 0.9 % IJ SOLN
INTRAMUSCULAR | Status: DC | PRN
  Administered 2019-03-02: 30 mL

## 2019-03-02 MED ORDER — SODIUM CHLORIDE 0.9 % IV SOLN
INTRAVENOUS | Status: DC
  Administered 2019-03-02 – 2019-03-03 (×2): via INTRAVENOUS

## 2019-03-02 MED ORDER — PANTOPRAZOLE SODIUM 40 MG PO TBEC
40.0000 mg | DELAYED_RELEASE_TABLET | Freq: Every day | ORAL | Status: DC
  Administered 2019-03-03 – 2019-03-04 (×2): 40 mg via ORAL
  Filled 2019-03-02 (×2): qty 1

## 2019-03-02 MED ORDER — MORPHINE SULFATE 10 MG/ML IJ SOLN
INTRAMUSCULAR | Status: DC | PRN
  Administered 2019-03-02: 10 mg

## 2019-03-02 MED ORDER — CEFAZOLIN SODIUM-DEXTROSE 2-4 GM/100ML-% IV SOLN
2.0000 g | Freq: Once | INTRAVENOUS | Status: AC
  Administered 2019-03-02: 07:00:00 2 g via INTRAVENOUS

## 2019-03-02 MED ORDER — LACTATED RINGERS IV SOLN
INTRAVENOUS | Status: DC
  Administered 2019-03-02 (×2): via INTRAVENOUS

## 2019-03-02 MED ORDER — PROPOFOL 500 MG/50ML IV EMUL
INTRAVENOUS | Status: AC
  Filled 2019-03-02: qty 50

## 2019-03-02 MED ORDER — APIXABAN 5 MG PO TABS
5.0000 mg | ORAL_TABLET | Freq: Two times a day (BID) | ORAL | Status: DC
  Administered 2019-03-03 – 2019-03-04 (×3): 5 mg via ORAL
  Filled 2019-03-02 (×3): qty 1

## 2019-03-02 MED ORDER — METOCLOPRAMIDE HCL 10 MG PO TABS: 5.0000 mg | ORAL_TABLET | Freq: Three times a day (TID) | ORAL | Status: DC | PRN

## 2019-03-02 MED ORDER — ONDANSETRON HCL 4 MG/2ML IJ SOLN: 4.0000 mg | Freq: Once | INTRAMUSCULAR | Status: DC | PRN

## 2019-03-02 MED ORDER — NEOMYCIN-POLYMYXIN B GU 40-200000 IR SOLN
Status: DC | PRN
  Administered 2019-03-02: 12 mL

## 2019-03-02 MED ORDER — PROPOFOL 500 MG/50ML IV EMUL
INTRAVENOUS | Status: DC | PRN
  Administered 2019-03-02: 100 ug/kg/min via INTRAVENOUS

## 2019-03-02 MED ORDER — METHOCARBAMOL 1000 MG/10ML IJ SOLN
500.0000 mg | Freq: Four times a day (QID) | INTRAVENOUS | Status: DC | PRN
  Filled 2019-03-02: qty 5

## 2019-03-02 MED ORDER — OXYCODONE HCL 5 MG PO TABS
10.0000 mg | ORAL_TABLET | ORAL | Status: DC | PRN
  Administered 2019-03-04 (×2): 10 mg via ORAL
  Filled 2019-03-02 (×2): qty 2

## 2019-03-02 MED ORDER — ONDANSETRON HCL 4 MG PO TABS: 4.0000 mg | ORAL_TABLET | Freq: Four times a day (QID) | ORAL | Status: DC | PRN

## 2019-03-02 MED ORDER — VITAMIN D3 25 MCG (1000 UNIT) PO TABS
1000.0000 [IU] | ORAL_TABLET | Freq: Every day | ORAL | Status: DC
  Administered 2019-03-03 – 2019-03-04 (×2): 1000 [IU] via ORAL
  Filled 2019-03-02 (×5): qty 1

## 2019-03-02 MED ORDER — TRAMADOL HCL 50 MG PO TABS
50.0000 mg | ORAL_TABLET | Freq: Four times a day (QID) | ORAL | Status: DC
  Administered 2019-03-02 – 2019-03-04 (×7): 50 mg via ORAL
  Filled 2019-03-02 (×8): qty 1

## 2019-03-02 MED ORDER — SODIUM CHLORIDE 0.9 % IV SOLN
INTRAVENOUS | Status: DC | PRN
  Administered 2019-03-02: 60 mL

## 2019-03-02 MED ORDER — METOCLOPRAMIDE HCL 5 MG/ML IJ SOLN: 5.0000 mg | Freq: Three times a day (TID) | INTRAMUSCULAR | Status: DC | PRN

## 2019-03-02 MED ORDER — BUPIVACAINE HCL (PF) 0.5 % IJ SOLN
INTRAMUSCULAR | Status: DC | PRN
  Administered 2019-03-02: 3 mL

## 2019-03-02 MED ORDER — MENTHOL 3 MG MT LOZG
1.0000 | LOZENGE | OROMUCOSAL | Status: DC | PRN
  Filled 2019-03-02: qty 9

## 2019-03-02 MED ORDER — FENTANYL CITRATE (PF) 100 MCG/2ML IJ SOLN: 25.0000 ug | INTRAMUSCULAR | Status: DC | PRN

## 2019-03-02 MED ORDER — BENAZEPRIL HCL 10 MG PO TABS
10.0000 mg | ORAL_TABLET | ORAL | Status: DC
  Administered 2019-03-03 – 2019-03-04 (×2): 10 mg via ORAL
  Filled 2019-03-02 (×5): qty 1

## 2019-03-02 MED ORDER — ALUM & MAG HYDROXIDE-SIMETH 200-200-20 MG/5ML PO SUSP
30.0000 mL | ORAL | Status: DC | PRN
  Administered 2019-03-04: 30 mL via ORAL
  Filled 2019-03-02: qty 30

## 2019-03-02 MED ORDER — HYDROMORPHONE HCL 1 MG/ML IJ SOLN: 0.5000 mg | INTRAMUSCULAR | Status: DC | PRN

## 2019-03-02 MED ORDER — ACETAMINOPHEN 500 MG PO TABS
1000.0000 mg | ORAL_TABLET | Freq: Four times a day (QID) | ORAL | Status: AC
  Administered 2019-03-02 – 2019-03-03 (×4): 1000 mg via ORAL
  Filled 2019-03-02 (×4): qty 2

## 2019-03-02 MED ORDER — PHENOL 1.4 % MT LIQD
1.0000 | OROMUCOSAL | Status: DC | PRN
  Filled 2019-03-02: qty 177

## 2019-03-02 MED ORDER — METHOCARBAMOL 500 MG PO TABS: 500.0000 mg | ORAL_TABLET | Freq: Four times a day (QID) | ORAL | Status: DC | PRN

## 2019-03-02 MED ORDER — CEFAZOLIN SODIUM-DEXTROSE 2-4 GM/100ML-% IV SOLN
2.0000 g | Freq: Four times a day (QID) | INTRAVENOUS | Status: AC
  Administered 2019-03-02 (×2): 2 g via INTRAVENOUS
  Filled 2019-03-02 (×2): qty 100

## 2019-03-02 MED ORDER — ACETAMINOPHEN 325 MG PO TABS
325.0000 mg | ORAL_TABLET | Freq: Four times a day (QID) | ORAL | Status: DC | PRN
  Filled 2019-03-02: qty 2

## 2019-03-02 MED ORDER — BUPIVACAINE LIPOSOME 1.3 % IJ SUSP
INTRAMUSCULAR | Status: AC
  Filled 2019-03-02: qty 20

## 2019-03-02 MED ORDER — NEOMYCIN-POLYMYXIN B GU 40-200000 IR SOLN
Status: AC
  Filled 2019-03-02: qty 20

## 2019-03-02 MED ORDER — PROPOFOL 10 MG/ML IV BOLUS
INTRAVENOUS | Status: DC | PRN
  Administered 2019-03-02: 50 mg via INTRAVENOUS

## 2019-03-02 MED ORDER — SODIUM CHLORIDE (PF) 0.9 % IJ SOLN
INTRAMUSCULAR | Status: AC
  Filled 2019-03-02: qty 100

## 2019-03-02 MED ORDER — GABAPENTIN 300 MG PO CAPS
300.0000 mg | ORAL_CAPSULE | Freq: Three times a day (TID) | ORAL | Status: DC
  Administered 2019-03-02 – 2019-03-04 (×7): 300 mg via ORAL
  Filled 2019-03-02 (×7): qty 1

## 2019-03-02 MED ORDER — ZOLPIDEM TARTRATE 5 MG PO TABS: 5.0000 mg | ORAL_TABLET | Freq: Every evening | ORAL | Status: DC | PRN

## 2019-03-02 MED ORDER — BISACODYL 5 MG PO TBEC
5.0000 mg | DELAYED_RELEASE_TABLET | Freq: Every day | ORAL | Status: DC | PRN
  Administered 2019-03-03: 5 mg via ORAL
  Filled 2019-03-02: qty 1

## 2019-03-02 MED ORDER — MORPHINE SULFATE (PF) 10 MG/ML IV SOLN
INTRAVENOUS | Status: AC
  Filled 2019-03-02: qty 1

## 2019-03-02 MED ORDER — MAGNESIUM CITRATE PO SOLN
1.0000 | Freq: Once | ORAL | Status: AC | PRN
  Administered 2019-03-04: 11:00:00 1 via ORAL
  Filled 2019-03-02 (×3): qty 296

## 2019-03-02 MED ORDER — DILTIAZEM HCL ER COATED BEADS 180 MG PO CP24
180.0000 mg | ORAL_CAPSULE | ORAL | Status: DC
  Administered 2019-03-02 – 2019-03-04 (×3): 180 mg via ORAL
  Filled 2019-03-02 (×5): qty 1

## 2019-03-02 SURGICAL SUPPLY — 68 items
BANDAGE ACE 6X5 VEL STRL LF (GAUZE/BANDAGES/DRESSINGS) ×3 IMPLANT
BLADE SAGITTAL 25.0X1.19X90 (BLADE) ×2 IMPLANT
BLADE SAGITTAL 25.0X1.19X90MM (BLADE) ×1
BLOCK CUTTING FEMUR 6 RT (MISCELLANEOUS) ×3 IMPLANT
BLOCK CUTTING TIBIAL 4 RT MIS (MISCELLANEOUS) ×3 IMPLANT
BLOCK CUTTING TIBIAL 5 RT (MISCELLANEOUS) ×3 IMPLANT
CANISTER SUCT 1200ML W/VALVE (MISCELLANEOUS) ×3 IMPLANT
CANISTER SUCT 3000ML PPV (MISCELLANEOUS) ×6 IMPLANT
CEMENT HV SMART SET (Cement) ×6 IMPLANT
CHLORAPREP W/TINT 26 (MISCELLANEOUS) ×6 IMPLANT
COOLER POLAR GLACIER W/PUMP (MISCELLANEOUS) ×3 IMPLANT
COVER WAND RF STERILE (DRAPES) ×3 IMPLANT
CUFF TOURN SGL QUICK 24 (TOURNIQUET CUFF) ×2
CUFF TOURN SGL QUICK 30 (TOURNIQUET CUFF)
CUFF TRNQT CYL 24X4X16.5-23 (TOURNIQUET CUFF) ×1 IMPLANT
CUFF TRNQT CYL 30X4X21-28X (TOURNIQUET CUFF) IMPLANT
DRAPE SHEET LG 3/4 BI-LAMINATE (DRAPES) ×6 IMPLANT
ELECT CAUTERY BLADE 6.4 (BLADE) ×3 IMPLANT
ELECT REM PT RETURN 9FT ADLT (ELECTROSURGICAL) ×3
ELECTRODE REM PT RTRN 9FT ADLT (ELECTROSURGICAL) ×1 IMPLANT
FEMORAL COMP CEMENTED SZ6 RT (Femur) ×3 IMPLANT
FEMUR BONE MODEL (MISCELLANEOUS) ×3 IMPLANT
GAUZE SPONGE 4X4 12PLY STRL (GAUZE/BANDAGES/DRESSINGS) ×3 IMPLANT
GAUZE XEROFORM 1X8 LF (GAUZE/BANDAGES/DRESSINGS) ×3 IMPLANT
GLOVE BIOGEL PI IND STRL 9 (GLOVE) ×1 IMPLANT
GLOVE BIOGEL PI INDICATOR 9 (GLOVE) ×2
GLOVE INDICATOR 8.0 STRL GRN (GLOVE) ×3 IMPLANT
GLOVE SURG ORTHO 8.0 STRL STRW (GLOVE) ×6 IMPLANT
GLOVE SURG SYN 9.0  PF PI (GLOVE) ×4
GLOVE SURG SYN 9.0 PF PI (GLOVE) ×2 IMPLANT
GOWN SRG 2XL LVL 4 RGLN SLV (GOWNS) ×1 IMPLANT
GOWN STRL NON-REIN 2XL LVL4 (GOWNS) ×2
GOWN STRL REUS W/ TWL LRG LVL3 (GOWN DISPOSABLE) ×2 IMPLANT
GOWN STRL REUS W/ TWL XL LVL3 (GOWN DISPOSABLE) ×1 IMPLANT
GOWN STRL REUS W/TWL LRG LVL3 (GOWN DISPOSABLE) ×4
GOWN STRL REUS W/TWL XL LVL3 (GOWN DISPOSABLE) ×2
HOLDER FOLEY CATH W/STRAP (MISCELLANEOUS) ×3 IMPLANT
HOOD PEEL AWAY FLYTE STAYCOOL (MISCELLANEOUS) ×6 IMPLANT
KIT PREVENA INCISION MGT 13 (CANNISTER) ×3 IMPLANT
KIT TURNOVER KIT A (KITS) ×3 IMPLANT
NDL SAFETY ECLIPSE 18X1.5 (NEEDLE) ×1 IMPLANT
NEEDLE HYPO 18GX1.5 SHARP (NEEDLE) ×2
NEEDLE SPNL 18GX3.5 QUINCKE PK (NEEDLE) ×3 IMPLANT
NEEDLE SPNL 20GX3.5 QUINCKE YW (NEEDLE) ×3 IMPLANT
NS IRRIG 1000ML POUR BTL (IV SOLUTION) ×3 IMPLANT
PACK TOTAL KNEE (MISCELLANEOUS) ×3 IMPLANT
PAD WRAPON POLAR KNEE (MISCELLANEOUS) ×1 IMPLANT
PATELLA RESURFACING MEDACTA SZ (Bone Implant) ×3 IMPLANT
PULSAVAC PLUS IRRIG FAN TIP (DISPOSABLE) ×3
SCALPEL PROTECTED #10 DISP (BLADE) ×6 IMPLANT
SOL .9 NS 3000ML IRR  AL (IV SOLUTION) ×2
SOL .9 NS 3000ML IRR UROMATIC (IV SOLUTION) ×1 IMPLANT
STAPLER SKIN PROX 35W (STAPLE) ×3 IMPLANT
STEM EXTENSION 11MMX30MM (Stem) ×3 IMPLANT
SUCTION FRAZIER HANDLE 10FR (MISCELLANEOUS) ×2
SUCTION TUBE FRAZIER 10FR DISP (MISCELLANEOUS) ×1 IMPLANT
SUT DVC 2 QUILL PDO  T11 36X36 (SUTURE) ×2
SUT DVC 2 QUILL PDO T11 36X36 (SUTURE) ×1 IMPLANT
SUT V-LOC 90 ABS DVC 3-0 CL (SUTURE) ×3 IMPLANT
SYR 20CC LL (SYRINGE) ×3 IMPLANT
SYR 50ML LL SCALE MARK (SYRINGE) ×6 IMPLANT
TIBIAL INSERT FIXED SZ5 RIGHT (Insert) ×3 IMPLANT
TIBIAL TRAY FIXED (Bone Implant) ×3 IMPLANT
TIP FAN IRRIG PULSAVAC PLUS (DISPOSABLE) ×1 IMPLANT
TOWEL OR 17X26 4PK STRL BLUE (TOWEL DISPOSABLE) ×3 IMPLANT
TOWER CARTRIDGE SMART MIX (DISPOSABLE) ×3 IMPLANT
TRAY FOLEY MTR SLVR 16FR STAT (SET/KITS/TRAYS/PACK) ×3 IMPLANT
WRAPON POLAR PAD KNEE (MISCELLANEOUS) ×3

## 2019-03-02 NOTE — H&P (Signed)
Reviewed paper H+P, will be scanned into chart. No changes noted.  

## 2019-03-02 NOTE — TOC Benefit Eligibility Note (Signed)
Transition of Care [facility]) Benefit Eligibility Note    Patient Details  Name: Johnny Freeman MRN: [id number] Date of Birth: 03/12/1951   Medication/Dose: Lovenox 42m once day for 14 days  Covered?: Yes  Prescription Coverage Preferred Pharmacy: Any in-network retail pharmacy  Spoke with Person/Company/Phone Number:: [email or web address] [employer] at [phone] Co-Pay: $141.26 estimated copay  Prior Approval: No  Deductible: Unmet(Individual deductible of $1,500 unmet.  Per rep, copay would apply directly to individual OOP of $4,000.  $2,571.39 met as of time of call.)  Additional Notes: Generic Enoxaparin covered with no PA required.  Estimated copay: $100.    HDannette BarbaraPhone Number: [phone] [phone]/10/2018, 2:20 PM

## 2019-03-02 NOTE — Anesthesia Procedure Notes (Signed)
Date/Time: 03/02/2019 7:39 AM Performed by: Nelda Marseille, CRNA Pre-anesthesia Checklist: Patient identified, Emergency Drugs available, Suction available, Patient being monitored and Timeout performed Oxygen Delivery Method: Simple face mask

## 2019-03-02 NOTE — Anesthesia Post-op Follow-up Note (Signed)
Anesthesia QCDR form completed.        

## 2019-03-02 NOTE — Anesthesia Procedure Notes (Signed)
Spinal  Patient location during procedure: OR Start time: 03/02/2019 7:19 AM End time: 03/02/2019 7:25 AM Staffing Anesthesiologist: Emmie Niemann, MD Resident/CRNA: Nelda Marseille, CRNA Performed: resident/CRNA  Preanesthetic Checklist Completed: patient identified, site marked, surgical consent, pre-op evaluation, timeout performed, IV checked, risks and benefits discussed and monitors and equipment checked Spinal Block Patient position: sitting Prep: ChloraPrep Patient monitoring: heart rate, continuous pulse ox, blood pressure and cardiac monitor Approach: midline Location: L4-5 Injection technique: single-shot Needle Needle type: Introducer and Pencil-Tip  Needle gauge: 24 G Needle length: 9 cm Additional Notes Negative paresthesia. Negative blood return. Positive free-flowing CSF. Expiration date of kit checked and confirmed. Patient tolerated procedure well, without complications.

## 2019-03-02 NOTE — Transfer of Care (Signed)
Immediate Anesthesia Transfer of Care Note  Patient: Johnny Freeman  Procedure(s) Performed: TOTAL KNEE ARTHROPLASTY RIGHT (Right Knee)  Patient Location: PACU  Anesthesia Type:Spinal  Level of Consciousness: awake, alert  and oriented  Airway & Oxygen Therapy: Patient Spontanous Breathing and Patient connected to face mask oxygen  Post-op Assessment: Report given to RN and Post -op Vital signs reviewed and stable  Post vital signs: Reviewed and stable  Last Vitals:  Vitals Value Taken Time  BP    Temp    Pulse 89 03/02/2019  9:30 AM  Resp 19 03/02/2019  9:30 AM  SpO2 100 % 03/02/2019  9:30 AM  Vitals shown include unvalidated device data.  Last Pain:  Vitals:   03/02/19 0617  TempSrc: Oral  PainSc: 0-No pain         Complications: No apparent anesthesia complications

## 2019-03-02 NOTE — Anesthesia Preprocedure Evaluation (Signed)
Anesthesia Evaluation  Patient identified by MRN, date of birth, ID band Patient awake    Reviewed: Allergy & Precautions, NPO status , Patient's Chart, lab work & pertinent test results  History of Anesthesia Complications (+) PONV and history of anesthetic complications  Airway Mallampati: II  TM Distance: >3 FB Neck ROM: Full    Dental no notable dental hx.    Pulmonary neg sleep apnea, neg COPD, former smoker, PE   breath sounds clear to auscultation- rhonchi (-) wheezing      Cardiovascular hypertension, Pt. on medications (-) CAD, (-) Past MI, (-) Cardiac Stents and (-) CABG  Rhythm:Regular Rate:Normal - Systolic murmurs and - Diastolic murmurs    Neuro/Psych  Headaches, neg Seizures Anxiety    GI/Hepatic Neg liver ROS, GERD  ,  Endo/Other  negative endocrine ROSneg diabetes  Renal/GU negative Renal ROS     Musculoskeletal  (+) Arthritis ,   Abdominal (+) + obese,   Peds  Hematology  (+) anemia ,   Anesthesia Other Findings Past Medical History: No date: Anxiety No date: Cancer [facility])     Comment:  prostate [id number] No date: Complication of anesthesia No date: Dyspnea No date: GERD (gastroesophageal reflux disease) No date: Headache     Comment:  migraines No date: Hypertension No date: PE (pulmonary thromboembolism) ([place]) No date: PONV (postoperative nausea and vomiting)     Comment:  nauseated after prostate bx HIFU   Reproductive/Obstetrics                             Lab Results  Component Value Date   WBC 5.4 02/19/2019   HGB 14.1 02/19/2019   HCT 42.3 02/19/2019   MCV 86.5 02/19/2019   PLT 232 02/19/2019    Anesthesia Physical Anesthesia Plan  ASA: III  Anesthesia Plan: Spinal   Post-op Pain Management:    Induction:   PONV Risk Score and Plan: 2 and Propofol infusion  Airway Management Planned: Natural Airway  Additional Equipment:   Intra-op Plan:    Post-operative Plan:   Informed Consent: I have reviewed the patients History and Physical, chart, labs and discussed the procedure including the risks, benefits and alternatives for the proposed anesthesia with the patient or authorized representative who has indicated his/her understanding and acceptance.     Dental advisory given  Plan Discussed with: CRNA and Anesthesiologist  Anesthesia Plan Comments:         Anesthesia Quick Evaluation

## 2019-03-02 NOTE — TOC Progression Note (Signed)
Transition of Care [facility]) - Progression Note    Patient Details  Name: Johnny Freeman MRN: [id number] Date of Birth: 1951/07/11  Transition of [facility]) CM/SW [place], RN Phone Number: 03/02/2019, 10:32 AM  Clinical Narrative:     lovenox price check requested from [facility]       Expected Discharge Plan and Services                                                 Social Determinants of Health (SDOH) Interventions    Readmission Risk Interventions No flowsheet data found.

## 2019-03-02 NOTE — Evaluation (Signed)
Physical Therapy Evaluation Patient Details Name: QUARAN KEDZIERSKI MRN: [id number] DOB: 1951/09/13 Today's Date: 03/02/2019   History of Present Illness  admitted for acute hospitalization s/p R TKR (03/02/19), WBAT.  Of note, s/p IVC filter placement 5/26 due to history of clotting issues after L TKR one year prior.  Clinical Impression  Upon evaluation, patient alert and oriented; follows commands, eager for OOB activities.  R LE pain controlled, rated 2/10 with full sensory return reported.  Excellent R quad control and activation; easily completing all supine therex (including SLR) without difficulty or lag noted.  Able to complete bed mobility indep; sit/stand, basic transfers and gait (50') with RW, cga/close sup.  Good tolerance for R LE stance time/WBing; excellent knee control and good overall comfort/confidence with gait trials.  Anticipate excellent progression towards mobility goals throughout course of rehab. Would benefit from skilled PT to address above deficits and promote optimal return to PLOF; Recommend transition to [place] upon discharge from acute hospitalization.     Follow Up Recommendations Home health PT    Equipment Recommendations       Recommendations for Other Services       Precautions / Restrictions Restrictions Weight Bearing Restrictions: Yes      Mobility  Bed Mobility Overal bed mobility: Modified Independent                Transfers Overall transfer level: Needs assistance Equipment used: Rolling walker (2 wheeled) Transfers: Sit to/from Stand Sit to Stand: Supervision         General transfer comment: good positioning, active use of R LE  Ambulation/Gait Ambulation/Gait assistance: Min guard Gait Distance (Feet): 50 Feet Assistive device: Rolling walker (2 wheeled)       General Gait Details: step to progressing to step through gait pattern; good WBing R LE; good R knee control/stability.  Stairs            Wheelchair  Mobility    Modified Rankin (Stroke Patients Only)       Balance Overall balance assessment: Needs assistance Sitting-balance support: No upper extremity supported;Feet supported Sitting balance-Leahy Scale: Normal     Standing balance support: Bilateral upper extremity supported Standing balance-Leahy Scale: Fair                               Pertinent Vitals/Pain Pain Assessment: 0-10 Pain Score: 2  Pain Location: R knee Pain Descriptors / Indicators: Aching;Grimacing;Guarding Pain Intervention(s): Monitored during session;Limited activity within patient's tolerance;Repositioned;Premedicated before session    [place] expects to be discharged to:: Private residence Living Arrangements: Spouse/significant other Available Help at Discharge: Family;Available 24 hours/day Type of Home: House Home Access: Stairs to enter Entrance Stairs-Rails: Right Entrance Stairs-Number of Steps: 2 Home Layout: One level Home Equipment: Walker - 2 wheels;Cane - single point Additional Comments: single step to enter/exit living room    Prior Function Level of Independence: Independent         Comments: Indep with ADLs, household and community mobilization; 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  Excellent quad function and overall activation)       Communication   Communication: No difficulties  Cognition Arousal/Alertness: Awake/alert Behavior  During Therapy: WFL for tasks assessed/performed Overall Cognitive Status: Within Functional Limits for tasks assessed                                        General Comments      Exercises Total Joint  Exercises Goniometric ROM: R knee: 0-100 degrees, act assist; L knee: 118 degrees flex Other Exercises Other Exercises: Supine R LE therex, 1x10, AROM: ankle pumps, quad sets, heel slides, SLR. EXcellent R quad activation and control; easily completes SLR without lag/difficulty.   Assessment/Plan    PT Assessment Patient needs continued PT services  PT Problem List Decreased strength;Decreased range of motion;Decreased activity tolerance;Decreased balance;Decreased mobility;Pain       PT Treatment Interventions DME instruction;Gait training;Stair training;Functional mobility training;Therapeutic activities;Therapeutic exercise;Balance training;Patient/family education    PT Goals (Current goals can be found in the Care Plan section)  Acute Rehab PT Goals Patient Stated Goal: to return home PT Goal Formulation: With patient Time For Goal Achievement: 03/16/19 Potential to Achieve Goals: Good    Frequency BID   Barriers to discharge        Co-evaluation               AM-PAC PT "6 Clicks" Mobility  Outcome Measure Help needed turning from your back to your side while in a flat bed without using bedrails?: None Help needed moving from lying on your back to sitting on the side of a flat bed without using bedrails?: None Help needed moving to and from a bed to a chair (including a wheelchair)?: A Little Help needed standing up from a chair using your arms (e.g., wheelchair or bedside chair)?: A Little Help needed to walk in hospital room?: A Little Help needed climbing 3-5 steps with a railing? : A Little 6 Click Score: 20    End of Session Equipment Utilized During Treatment: Gait belt Activity Tolerance: Patient tolerated treatment well Patient left: in chair;with call bell/phone within reach;with chair alarm set Nurse Communication: Mobility status PT Visit Diagnosis: Muscle weakness (generalized) (M62.81);Difficulty in walking, not elsewhere classified  (R26.2);Pain Pain - Right/Left: Right Pain - part of body: Knee    Time: 3545-6256 PT Time Calculation (min) (ACUTE ONLY): 26 min   Charges:   PT Evaluation $PT Eval Moderate Complexity: 1 Mod PT Treatments $Therapeutic Exercise: 8-22 mins        Taras Rask H. Owens Shark, PT, DPT, NCS 03/02/19, 9:09 PM [phone]

## 2019-03-02 NOTE — Op Note (Signed)
03/02/2019  9:28 AM  PATIENT:  Johnny Freeman  68 y.o. male  PRE-OPERATIVE DIAGNOSIS:  PRIMARY OSEOARTHRITIS OF RIGHT KNEE  POST-OPERATIVE DIAGNOSIS:  PRIMARY OSEOARTHRITIS OF RIGHT KNEE  PROCEDURE:  Procedure(s): TOTAL KNEE ARTHROPLASTY RIGHT (Right)  SURGEON: Laurene Footman, MD  ASSISTANTS: Rachelle Hora, PA-C  ANESTHESIA:   spinal  EBL:  Total I/O In: 1000 [I.V.:1000] Out: 225 [Urine:200; Blood:25]  BLOOD ADMINISTERED:none  DRAINS: none   LOCAL MEDICATIONS USED:  MARCAINE    and OTHER Exparel morphine and Toradol  SPECIMEN:  No Specimen  DISPOSITION OF SPECIMEN:  N/A  COUNTS:  YES  TOURNIQUET:   Total Tourniquet Time Documented: Thigh (Right) - 83 minutes Total: Thigh (Right) - 83 minutes   IMPLANTS: Medacta GM K sphere right 6 femur, right 5 tibia with short stem and 10 mm insert, 3 patella, all components cemented  DICTATION: .Dragon Dictation   patient brought the operating room and after adequate anesthesia was obtained theright leg was prepped and draped in sterile fashion was turned by the upper thigh. After patient identification and timeout procedures were completed, tourniquet raisedmidline incision was made followed by medial parapatellar arthrotomy. Inspection revealed extensive degenerative changes throughout the knee particularly medialcompartment there is exposed bone over the entire condyle tibial and femoral bone loss. Is also moderate synovitis which was subsequently excised. Fat pad and PCL and anterior cruciate ligament were excised at this time and proximal tibia cutting block applied with proximal tibia cut carried out.. The 4-in-1 cutting block was applied anterior posterior and chamfer cuts made. The proximal tibia was prepared with removal of posterior horns of menisci. Placement of the5tibia baseplate and proximal tibial preparation with drillingwithproximal preparation for short stem. With the trial baseplate in place [id number] trial was  placed and a 33mm insert gave excellent stability. Distal femoral drill holes were made followed by the trochlear groove cut with thereaming for the trochlear groove. These trials were then removed and the patella cut using the patellar cutting guide and measured to a size3injection of the above local was placed and the tourniquet then raised.Bony surfaces were thoroughly irrigated and dried,the tibial component was cemented into place first followed by the placement of the polyethylene component,set screw with torque screwdriver and femoral component with excess cement removed and the knee held in extension patellar button was then clamped into place after the cementhadset the patella did track well, tourniquet then let down and bleeding checked electrocautery. After thorough irrigation of the knee the arthrotomy was repaired heavy Quill suture to close the capsule.3-0 v-locsubcutaneously followed by skin staples and incisional wound VAC, followed by Ace wrap  and Polar Care  PLAN OF CARE: Admit to inpatient   PATIENT DISPOSITION:  PACU - hemodynamically stable.

## 2019-03-03 LAB — BASIC METABOLIC PANEL
Anion gap: 5 (ref 5–15)
BUN: 15 mg/dL (ref 8–23)
CO2: 25 mmol/L (ref 22–32)
Calcium: 8.2 mg/dL — ABNORMAL LOW (ref 8.9–10.3)
Chloride: 102 mmol/L (ref 98–111)
Creatinine, Ser: 0.98 mg/dL (ref 0.61–1.24)
GFR calc Af Amer: >60 mL/min (ref >60)
GFR calc non Af Amer: >60 mL/min (ref >60)
Glucose, Bld: 117 mg/dL — ABNORMAL HIGH (ref 70–99)
Potassium: 4.1 mmol/L (ref 3.5–5.1)
Sodium: 132 mmol/L — ABNORMAL LOW (ref 135–145)

## 2019-03-03 LAB — CBC
HCT: 34 % — ABNORMAL LOW (ref 39.0–52.0)
Hemoglobin: 11.1 g/dL — ABNORMAL LOW (ref 13.0–17.0)
MCH: 28.5 pg (ref 26.0–34.0)
MCHC: 32.6 g/dL (ref 30.0–36.0)
MCV: 87.2 fL (ref 80.0–100.0)
Platelets: 187 10*3/uL (ref 150–400)
RBC: 3.9 MIL/uL — ABNORMAL LOW (ref 4.22–5.81)
RDW: 14.1 % (ref 11.5–15.5)
WBC: 10.3 10*3/uL (ref 4.0–10.5)
nRBC: 0 % (ref 0.0–0.2)

## 2019-03-03 NOTE — Anesthesia Postprocedure Evaluation (Signed)
Anesthesia Post Note  Patient: Johnny Freeman  Procedure(s) Performed: TOTAL KNEE ARTHROPLASTY RIGHT (Right Knee)  Patient location during evaluation: Nursing Unit Anesthesia Type: Spinal Level of consciousness: awake, awake and alert and oriented Pain management: pain level controlled Vital Signs Assessment: post-procedure vital signs reviewed and stable Respiratory status: spontaneous breathing, nonlabored ventilation and respiratory function stable Cardiovascular status: blood pressure returned to baseline and stable Postop Assessment: no headache and no backache Anesthetic complications: no     Last Vitals:  Vitals:   03/03/19 0450 03/03/19 0740  BP: 113/70 102/61  Pulse: 81 78  Resp: 18 16  Temp: 36.8 C 36.7 C  SpO2: 94% 94%    Last Pain:  Vitals:   03/03/19 0740  TempSrc: Oral  PainSc:                  [name]

## 2019-03-03 NOTE — TOC Initial Note (Signed)
Transition of Care [facility]) - Initial/Assessment Note    Patient Details  Name: Johnny Freeman MRN: [id number] Date of Birth: [birth year]  Transition of Care [facility]) CM/SW Contact:    Su Hilt, RN Phone Number: 03/03/2019, 11:52 AM  Clinical Narrative:                 Met with the patient to discuss DC plan and needs He states that he lives at home with his spouse and she will be providing transportation He has DME at home from Previous surgery and has no needs for more DME,  [facility], notified teresa that the patient would like to use [facility] the PT that he had in the past He is on Elequis and will not need Lovenox, he can afford his medications and uses [facility] Sees Dr Edwina Barth as PCP and is up to date He states that he has no other needs   Expected Discharge Plan: [place] Barriers to Discharge: Continued Medical Work up   Patient Goals and CMS Choice Patient states their goals for this hospitalization and ongoing recovery are:: go home with his wife CMS Medicare.gov Compare Post Acute Care list provided to:: Patient Choice offered to / list presented to : Patient  Expected Discharge Plan and Services Expected Discharge Plan: [place]   Discharge Planning Services: CM Consult Post Acute Care Choice: [place] arrangements for the past 2 months: Single Family Home                 DME Arranged: Patient refused services         HH Arranged: PT [place] Agency: [facility] [employer] [facility] [employer] Date [place]: 03/03/19 Time [place]: 1150 Representative spoke with at [place]: [place] Arrangements/Services Living arrangements for the past 2 months: [place] Lives with:: Spouse Patient language and need for interpreter reviewed:: No Do you feel safe going back to the place where you live?: Yes      Need for Family  Participation in Patient Care: No (Comment) Care giver support system in place?: Yes (comment) Current home services: DME(RW) Criminal Activity/Legal Involvement Pertinent to Current Situation/Hospitalization: No - Comment as needed  Activities of Daily Living Home Assistive Devices/Equipment: None ADL Screening (condition at time of admission) Patient's cognitive ability adequate to safely complete daily activities?: Yes Is the patient deaf or have difficulty hearing?: No Does the patient have difficulty seeing, even when wearing glasses/contacts?: No Does the patient have difficulty concentrating, remembering, or making decisions?: No Patient able to express need for assistance with ADLs?: Yes Does the patient have difficulty dressing or bathing?: No Independently performs ADLs?: Yes (appropriate for developmental age) Does the patient have difficulty walking or climbing stairs?: Yes Weakness of Legs: Right Weakness of Arms/Hands: None  Permission Sought/Granted Permission sought to share information with : Case Manager Permission granted to share information with : Yes, Verbal Permission Granted              Emotional Assessment Appearance:: Appears stated age Attitude/Demeanor/Rapport: Engaged Affect (typically observed): Accepting Orientation: : Oriented to Self, Oriented to Place, Oriented to  Time, Oriented to Situation Alcohol / Substance Use: Never Used Psych Involvement: No (comment)  Admission diagnosis:  PRIMARY OSEOARTHRITIS OF RIGHT KNEE Patient Active Problem List   Diagnosis Date Noted  . Status post total knee replacement  using cement, right 03/02/2019  . Arthritis of right knee 12/08/2018  . Anemia 12/07/2017  . Pulmonary embolus ([place]) 12/01/2017  . History of prostate cancer 12/01/2017  . Primary localized osteoarthritis of left knee 11/04/2017  . Obesity, unspecified 04/17/2017  . CAD (coronary artery disease) 12/26/2014  . Hyperlipidemia 12/26/2014   . Hypertension 12/26/2014   PCP:  Baxter Hire, MD Pharmacy:   [place] [place] [facility] [address] [place] [address] Phone: [phone] Fax: [phone]     Social Determinants of Health (SDOH) Interventions    Readmission Risk Interventions No flowsheet data found.

## 2019-03-03 NOTE — Progress Notes (Signed)
   Subjective: 1 Day Post-Op Procedure(s) (LRB): TOTAL KNEE ARTHROPLASTY RIGHT (Right) Patient reports pain as mild.   Patient is well, and has had no acute complaints or problems Denies any CP, SOB, ABD pain. We will continue therapy today.  Plan is to go Home after hospital stay.  Objective: Vital signs in last 24 hours: Temp:  [97 F (36.1 C)-98.2 F (36.8 C)] 98 F (36.7 C) (06/03 0740) Pulse Rate:  [67-88] 78 (06/03 0740) Resp:  [14-19] 16 (06/03 0740) BP: (95-132)/(61-82) 102/61 (06/03 0740) SpO2:  [94 %-100 %] 94 % (06/03 0740)  Intake/Output from previous day: 06/02 0701 - 06/03 0700 In: 3235 [P.O.:480; I.V.:2655; IV Piggyback:100] Out: 1540 [Urine:1515; Blood:25] Intake/Output this shift: No intake/output data recorded.  Recent Labs    03/03/19 0248  HGB 11.1*   Recent Labs    03/03/19 0248  WBC 10.3  RBC 3.90*  HCT 34.0*  PLT 187   Recent Labs    03/03/19 0248  NA 132*  K 4.1  CL 102  CO2 25  BUN 15  CREATININE 0.98  GLUCOSE 117*  CALCIUM 8.2*   No results for input(s): LABPT, INR in the last 72 hours.  EXAM General - Patient is Alert, Appropriate and Oriented Extremity - Neurovascular intact Sensation intact distally Intact pulses distally Dorsiflexion/Plantar flexion intact No cellulitis present Compartment soft Dressing - dressing C/D/I and no drainage, Praveena intact Motor Function - intact, moving foot and toes well on exam.   Past Medical History:  Diagnosis Date  . Anxiety   . Cancer [facility])    prostate 2011  . Complication of anesthesia   . Dyspnea   . GERD (gastroesophageal reflux disease)   . Headache    migraines  . Hypertension   . PE (pulmonary thromboembolism) ([place])   . PONV (postoperative nausea and vomiting)    nauseated after prostate bx HIFU     Assessment/Plan:   1 Day Post-Op Procedure(s) (LRB): TOTAL KNEE ARTHROPLASTY RIGHT (Right) Active Problems:   Status post total knee replacement using cement,  right  Estimated body mass index is 31.92 kg/m as calculated from the following:   Height as of this encounter: 5\' 8"  (1.727 m).   Weight as of this encounter: 95.2 kg. Advance diet Up with therapy  Labs stable Vital signs stable Pain well controlled Needs bowel movement Care management to assist with discharge to home with home health PT  DVT Prophylaxis - Foot Pumps and TED hose, Eliquis  weight-Bearing as tolerated to right leg   T. Rachelle Hora, PA-C [place] 03/03/2019, 7:53 AM

## 2019-03-03 NOTE — Evaluation (Signed)
Occupational Therapy Evaluation Patient Details Name: Johnny Freeman MRN: [id number] DOB: January 27, 1951 Today's Date: 03/03/2019    History of Present Illness admitted for acute hospitalization s/p R TKR (03/02/19), WBAT.  Of note, s/p IVC filter placement 5/26 due to history of clotting issues after L TKR one year prior.   Clinical Impression   Pt seen for OT evaluation this date, POD#1 from above surgery. 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 Pt is eager to return to PLOF with less pain and improved safety and independence. Pt currently requires PRN minimal assist for LB dressing and bathing while in seated position due to pain and limited AROM of R knee. Pt instructed in polar care mgt, falls prevention strategies including pet care considerations, home/routines modifications, DME/AE for LB bathing and dressing tasks, and compression stocking mgt. Pt verbalized understanding and recall from previous surgery. Able to verbalize plan for spouse to assist with all aspects of recovery. Pt benefited maximally from OT evaluation and instruction provided. Do not currently anticipate any OT needs following this hospitalization. Will sign off. Please re-consult if additional needs arise.      Follow Up Recommendations  No OT follow up    Equipment Recommendations  None recommended by OT    Recommendations for Other Services       Precautions / Restrictions Precautions Precautions: Fall Restrictions Weight Bearing Restrictions: Yes RLE Weight Bearing: Weight bearing as tolerated      Mobility Bed Mobility Overal bed mobility: Modified Independent                Transfers                 General transfer comment: pt declined    Balance Overall balance assessment: Needs assistance Sitting-balance support: No upper extremity supported;Feet supported Sitting balance-Leahy Scale: Normal                                      ADL either performed or assessed with clinical judgement   ADL Overall ADL's : Needs assistance/impaired                                       General ADL Comments: PRN Min assist for LB ADL and Max assist for compression stocking mgt - spouse able to provide needed level of assist, familiar with previous L TKA     Vision Patient Visual Report: No change from baseline       Perception     Praxis      Pertinent Vitals/Pain Pain Assessment: No/denies pain Pain Intervention(s): Monitored during session;Ice applied     Hand Dominance Right   Extremity/Trunk Assessment Upper Extremity Assessment Upper Extremity Assessment: Generalized weakness   Lower Extremity Assessment Lower Extremity Assessment: Defer to PT evaluation(R LE grossly 4-/5; full sensory return.  Excellent quad function and overall activation)   Cervical / Trunk Assessment Cervical / Trunk Assessment: Normal   Communication Communication Communication: No difficulties   Cognition Arousal/Alertness: Awake/alert Behavior During Therapy: WFL for tasks assessed/performed Overall Cognitive Status: Within Functional Limits for tasks assessed  General Comments       Exercises Other Exercises Other Exercises: Pt instructed in polar care mgt, compression stockings mgt, falls prevention including pet care considerations, and AE/DME for ADL tasks   Shoulder Instructions      Home Living Family/patient expects to be discharged to:: Private residence Living Arrangements: Spouse/significant other Available Help at Discharge: Family;Available 24 hours/day Type of Home: House Home Access: Stairs to enter [employer] of Steps: 2 Entrance Stairs-Rails: Right Home Layout: One level               Home Equipment: Walker - 2 wheels;Cane - single point   Additional Comments: single step to  enter/exit living room      Prior Functioning/Environment Level of Independence: Independent        Comments: Indep with ADLs, household and community mobilization; 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        OT Problem List: Decreased strength;Decreased range of motion      OT Treatment/Interventions:      OT Goals(Current goals can be found in the care plan section) Acute Rehab OT Goals Patient Stated Goal: to return home OT Goal Formulation: All assessment and education complete, DC therapy  OT Frequency:     Barriers to D/C:            Co-evaluation              AM-PAC OT "6 Clicks" Daily Activity     Outcome Measure Help from another person eating meals?: None Help from another person taking care of personal grooming?: None Help from another person toileting, which includes using toliet, bedpan, or urinal?: A Little Help from another person bathing (including washing, rinsing, drying)?: A Little Help from another person to put on and taking off regular upper body clothing?: None Help from another person to put on and taking off regular lower body clothing?: A Little 6 Click Score: 21   End of Session    Activity Tolerance: Patient tolerated treatment well Patient left: in bed;with call bell/phone within reach;with bed alarm set;with SCD's reapplied;Other (comment)(polar care in place)  OT Visit Diagnosis: Other abnormalities of gait and mobility (R26.89)                Time: 6945-0388 OT Time Calculation (min): 10 min Charges:  OT General Charges $OT Visit: 1 Visit OT Evaluation $OT Eval Low Complexity: 1 Low  Jeni Salles, MPH, MS, OTR/L ascom [phone] 03/03/19, 9:11 AM

## 2019-03-03 NOTE — Progress Notes (Signed)
Physical Therapy Treatment Patient Details Name: Johnny Freeman MRN: [id number] DOB: [birth year] Today's Date: 03/03/2019    History of Present Illness admitted for acute hospitalization s/p R TKR (03/02/19), WBAT.  Of note, s/p IVC filter placement 5/26 due to history of clotting issues after L TKR one year prior.    PT Comments    Patient just returned to bed after toileting with RN; requests to defer additional OOB/stairs this PM due to fatigue and increased pain in R LE.  Session focus on isolated strength and muscular endurance/ROM to R LE; continues with good strength and ROM, good control and overall participation with therex. Will address stairs next session.   Follow Up Recommendations  Home health PT     Equipment Recommendations       Recommendations for Other Services       Precautions / Restrictions Precautions Precautions: Fall Restrictions Weight Bearing Restrictions: Yes RLE Weight Bearing: Weight bearing as tolerated    Mobility  Bed Mobility Overal bed mobility: Modified Independent                Transfers Overall transfer level: Needs assistance Equipment used: Rolling walker (2 wheeled) Transfers: Sit to/from Stand Sit to Stand: Supervision         General transfer comment: patient declined; just returned to bed from toileting, increased pain reported  Ambulation/Gait Ambulation/Gait assistance: Supervision Gait Distance (Feet): 200 Feet Assistive device: Rolling walker (2 wheeled)   Gait velocity: 10' walk time, 10-11 seconds   General Gait Details: patient declined; just returned to bed from toileting, increased pain reported   Stairs             Wheelchair Mobility    Modified Rankin (Stroke Patients Only)       Balance Overall balance assessment: Needs assistance Sitting-balance support: No upper extremity supported;Feet supported Sitting balance-Leahy Scale: Good     Standing balance support: Bilateral upper  extremity supported Standing balance-Leahy Scale: Fair                              Cognition Arousal/Alertness: Awake/alert Behavior During Therapy: WFL for tasks assessed/performed Overall Cognitive Status: Within Functional Limits for tasks assessed                                        Exercises Total Joint Exercises Goniometric ROM: R knee: 3-100 degrees Other Exercises Other Exercises: Supine LE therex, 1x15, AROM for muscular strength/endurance: ankle pumps, quad sets, SAQs, heel slides, hip abduct/adduct and SLR.  Increased pain reported this date, but muscular strength/control remains appropriate. Other Exercises: Supine/sit, mod indep-indep manages R LE over edge of bed without difficulty    General Comments        Pertinent Vitals/Pain Pain Assessment: Faces Pain Score: 2  Faces Pain Scale: Hurts even more Pain Location: R knee Pain Descriptors / Indicators: Aching;Grimacing;Guarding Pain Intervention(s): Limited activity within patient's tolerance;Monitored during session;Repositioned    Home Living                      Prior Function            PT Goals (current goals can now be found in the care plan section) Acute Rehab PT Goals Patient Stated Goal: to return home PT Goal Formulation: With patient Time For Goal Achievement: 03/16/19  Potential to Achieve Goals: Good Progress towards PT goals: Progressing toward goals    Frequency    BID      PT Plan Current plan remains appropriate    Co-evaluation              AM-PAC PT "6 Clicks" Mobility   Outcome Measure  Help needed turning from your back to your side while in a flat bed without using bedrails?: None Help needed moving from lying on your back to sitting on the side of a flat bed without using bedrails?: None Help needed moving to and from a bed to a chair (including a wheelchair)?: None Help needed standing up from a chair using your arms  (e.g., wheelchair or bedside chair)?: A Little Help needed to walk in hospital room?: A Little Help needed climbing 3-5 steps with a railing? : A Little 6 Click Score: 21    End of Session Equipment Utilized During Treatment: Gait belt Activity Tolerance: Patient tolerated treatment well Patient left: in bed;with call bell/phone within reach;with bed alarm set Nurse Communication: Mobility status PT Visit Diagnosis: Muscle weakness (generalized) (M62.81);Difficulty in walking, not elsewhere classified (R26.2);Pain Pain - Right/Left: Right Pain - part of body: Knee     Time: 1541-1606 PT Time Calculation (min) (ACUTE ONLY): 25 min  Charges:  $Gait Training: 8-22 mins $Therapeutic Exercise: 8-22 mins $Therapeutic Activity: 8-22 mins                     Johnny Freeman, PT, DPT, NCS 03/03/19, 4:14 PM [phone]

## 2019-03-03 NOTE — Progress Notes (Signed)
Physical Therapy Treatment Patient Details Name: Johnny Freeman MRN: [id number] DOB: Feb 01, 1951 Today's Date: 03/03/2019    History of Present Illness admitted for acute hospitalization s/p R TKR (03/02/19), WBAT.  Of note, s/p IVC filter placement 5/26 due to history of clotting issues after L TKR one year prior.    PT Comments    Able to complete full lap around nursing station (200') with RW, close sup this date; min cuing for R heel strike/toe off and overall R knee mechanics. Good stability and overall control. Steady cadence withotu buckling or LOB (10' walk time, 10-11 seconds).  Patient encouraged by progress and performance Plan to initiate stair training this PM.     Follow Up Recommendations  Home health PT     Equipment Recommendations       Recommendations for Other Services       Precautions / Restrictions Precautions Precautions: Fall Restrictions Weight Bearing Restrictions: Yes RLE Weight Bearing: Weight bearing as tolerated    Mobility  Bed Mobility Overal bed mobility: Modified Independent                Transfers Overall transfer level: Needs assistance Equipment used: Rolling walker (2 wheeled) Transfers: Sit to/from Stand Sit to Stand: Supervision            Ambulation/Gait Ambulation/Gait assistance: Supervision Gait Distance (Feet): 200 Feet Assistive device: Rolling walker (2 wheeled)   Gait velocity: 10' walk time, 10-11 seconds   General Gait Details: step through gait pattern with good R LE stance time; min cuing for heel strike/toe off and overall R knee mechanics   Stairs             Wheelchair Mobility    Modified Rankin (Stroke Patients Only)       Balance Overall balance assessment: Needs assistance Sitting-balance support: No upper extremity supported;Feet supported Sitting balance-Leahy Scale: Normal     Standing balance support: Bilateral upper extremity supported Standing balance-Leahy Scale:  Fair                              Cognition Arousal/Alertness: Awake/alert Behavior During Therapy: WFL for tasks assessed/performed Overall Cognitive Status: Within Functional Limits for tasks assessed                                        Exercises Total Joint Exercises Goniometric ROM: R knee: 3-100 degrees Other Exercises Other Exercises: Seated LE therex, 1x15, act assist ROM: ankle pumps, LAQs with end-range knee flexion, marching; educated in ankle pumps and quad sets while `long-sitting in recliner.  Patient voiced agreement/understanding.    General Comments        Pertinent Vitals/Pain Pain Assessment: 0-10 Pain Score: 2  Pain Location: R knee Pain Descriptors / Indicators: Aching;Grimacing;Guarding Pain Intervention(s): Limited activity within patient's tolerance;Monitored during session;Repositioned;Premedicated before session    Home Living                      Prior Function            PT Goals (current goals can now be found in the care plan section) Acute Rehab PT Goals Patient Stated Goal: to return home PT Goal Formulation: With patient Time For Goal Achievement: 03/16/19 Potential to Achieve Goals: Good Progress towards PT goals: Progressing toward goals    Frequency  BID      PT Plan Current plan remains appropriate    Co-evaluation              AM-PAC PT "6 Clicks" Mobility   Outcome Measure  Help needed turning from your back to your side while in a flat bed without using bedrails?: None Help needed moving from lying on your back to sitting on the side of a flat bed without using bedrails?: None Help needed moving to and from a bed to a chair (including a wheelchair)?: None Help needed standing up from a chair using your arms (e.g., wheelchair or bedside chair)?: None Help needed to walk in hospital room?: A Little Help needed climbing 3-5 steps with a railing? : A Little 6 Click Score:  22    End of Session Equipment Utilized During Treatment: Gait belt Activity Tolerance: Patient tolerated treatment well Patient left: in chair;with call bell/phone within reach;with chair alarm set Nurse Communication: Mobility status PT Visit Diagnosis: Muscle weakness (generalized) (M62.81);Difficulty in walking, not elsewhere classified (R26.2);Pain Pain - Right/Left: Right Pain - part of body: Knee     Time: 1030-1053 PT Time Calculation (min) (ACUTE ONLY): 23 min  Charges:  $Gait Training: 8-22 mins $Therapeutic Exercise: 8-22 mins                     Brihanna Devenport H. Owens Shark, PT, DPT, NCS 03/03/19, 1:45 PM [phone]

## 2019-03-04 MED ORDER — OXYCODONE HCL 5 MG PO TABS: 5.0000 mg | ORAL_TABLET | ORAL | 0 refills | Status: DC | PRN

## 2019-03-04 MED ORDER — BISACODYL 10 MG RE SUPP
10.0000 mg | Freq: Once | RECTAL | Status: AC
  Administered 2019-03-04: 10 mg via RECTAL
  Filled 2019-03-04: qty 1

## 2019-03-04 NOTE — Discharge Instructions (Signed)

## 2019-03-04 NOTE — Discharge Summary (Signed)
Physician Discharge Summary  Patient ID: Johnny Freeman MRN: [id number] DOB/AGE: 1951/09/27 68 y.o.  Admit date: 03/02/2019 Discharge date: 03/04/2019  Admission Diagnoses:  PRIMARY OSEOARTHRITIS OF RIGHT KNEE   Discharge Diagnoses: Patient Active Problem List   Diagnosis Date Noted  . Status post total knee replacement using cement, right 03/02/2019  . Arthritis of right knee 12/08/2018  . Anemia 12/07/2017  . Pulmonary embolus ([place] ) 12/01/2017  . History of prostate cancer 12/01/2017  . Primary localized osteoarthritis of left knee 11/04/2017  . Obesity, unspecified 04/17/2017  . CAD (coronary artery disease) 12/26/2014  . Hyperlipidemia 12/26/2014  . Hypertension 12/26/2014    Past Medical History:  Diagnosis Date  . Anxiety   . Cancer [facility])    prostate 2011  . Complication of anesthesia   . Dyspnea   . GERD (gastroesophageal reflux disease)   . Headache    migraines  . Hypertension   . PE (pulmonary thromboembolism) ([place])   . PONV (postoperative nausea and vomiting)    nauseated after prostate bx HIFU     Transfusion: none   Consultants (if any):   Discharged Condition: Improved  Hospital Course: Johnny Freeman is an 68 y.o. male who was admitted 03/02/2019 with a diagnosis of right knee osteoarthritis and went to the operating room on 03/02/2019 and underwent the above named procedures.    Surgeries: Procedure(s): TOTAL KNEE ARTHROPLASTY RIGHT on 03/02/2019 Patient tolerated the surgery well. Taken to PACU where she was stabilized and then transferred to the orthopedic floor.  Started on Eliquis, teds, SCDs. Heels elevated on bed with rolled towels. No evidence of DVT. Negative Homan. Physical therapy started on day #1 for gait training and transfer. OT started day #1 for ADL and assisted devices.  Patient's foley was d/c on day #1. Patient's IV was d/c on day #2.  On post op day #1 patient was stable and ready for discharge to home with home health  PT.  Implants: Medacta GM K sphere right 6 femur, right 5 tibia with short stem and 10 mm insert, 3 patella, all components cemented  He was given perioperative antibiotics:  Anti-infectives (From admission, onward)   Start     Dose/Rate Route Frequency Ordered Stop   03/02/19 1330  ceFAZolin (ANCEF) IVPB 2g/100 mL premix     2 g 200 mL/hr over 30 Minutes Intravenous Every 6 hours 03/02/19 1101 03/02/19 1908   03/02/19 0630  ceFAZolin (ANCEF) IVPB 2g/100 mL premix     2 g 200 mL/hr over 30 Minutes Intravenous  Once 03/02/19 0616 03/02/19 0735   03/02/19 0620  ceFAZolin (ANCEF) 2-4 GM/100ML-% IVPB    Note to Pharmacy:  Ronnell Freshwater   : cabinet override      03/02/19 0620 03/02/19 1829    .  He was given sequential compression devices, early ambulation, and Eliquis, teds for DVT prophylaxis.  He benefited maximally from the hospital stay and there were no complications.    Recent vital signs:  Vitals:   03/04/19 0508 03/04/19 0818  BP: 135/72 117/70  Pulse: 98 91  Resp: 19 16  Temp: 98.8 F (37.1 C) 99.1 F (37.3 C)  SpO2: 95% 94%    Recent laboratory studies:  Lab Results  Component Value Date   HGB 11.1 (L) 03/03/2019   HGB 14.1 02/19/2019   HGB 14.5 12/16/2018   Lab Results  Component Value Date   WBC 10.3 03/03/2019   PLT 187 03/03/2019   Lab Results  Component  Value Date   INR 1.2 02/19/2019   Lab Results  Component Value Date   NA 132 (L) 03/03/2019   K 4.1 03/03/2019   CL 102 03/03/2019   CO2 25 03/03/2019   BUN 15 03/03/2019   CREATININE 0.98 03/03/2019   GLUCOSE 117 (H) 03/03/2019    Discharge Medications:   Allergies as of 03/04/2019      Reactions   Shellfish Allergy Swelling   Swelling in eyes      Medication List    TAKE these medications   acetaminophen 500 MG tablet Commonly known as:  TYLENOL Take 500 mg by mouth 2 (two) times daily as needed (FOR PAIN.).   apixaban 5 MG Tabs tablet Commonly known as:  Eliquis Take 1 tablet  (5 mg total) by mouth 2 (two) times daily. What changed:  how much to take   benazepril 10 MG tablet Commonly known as:  LOTENSIN Take 10 mg by mouth every morning.   diltiazem 180 MG 24 hr capsule Commonly known as:  CARDIZEM CD Take 180 mg by mouth every morning.   docusate sodium 100 MG capsule Commonly known as:  COLACE Take 100 mg by mouth every other day.   multivitamin with minerals Tabs tablet Take 1 tablet by mouth daily.   omeprazole 20 MG capsule Commonly known as:  PRILOSEC Take 20 mg by mouth every evening.   oxyCODONE 5 MG immediate release tablet Commonly known as:  Oxy IR/ROXICODONE Take 1-2 tablets (5-10 mg total) by mouth every 4 (four) hours as needed for moderate pain (pain score 4-6).   PROBIOTIC-10 PO Take 1 tablet by mouth daily.   triamcinolone cream 0.1 % Commonly known as:  KENALOG Apply 1 application topically 2 (two) times daily as needed (dry skin).   Vitamin D-3 25 MCG (1000 UT) Caps Take 1 tablet by mouth daily with lunch.            Durable Medical Equipment  (From admission, onward)         Start     Ordered   03/02/19 1102  DME Walker rolling  Once    Question:  Patient needs a walker to treat with the following condition  Answer:  Status post total knee replacement using cement, right   03/02/19 1101   03/02/19 1102  DME 3 n 1  Once     03/02/19 1101   03/02/19 1102  DME Bedside commode  Once    Question:  Patient needs a bedside commode to treat with the following condition  Answer:  Status post total knee replacement using cement, right   03/02/19 1101          Diagnostic Studies: Dg Knee 1-2 Views Right  Result Date: 03/02/2019 CLINICAL DATA:  Status post right knee replacement today. EXAM: RIGHT KNEE - 1-2 VIEW COMPARISON:  Preoperative planning CT scan 11/03/2018. FINDINGS: New total knee arthroplasty is in place. The device is located. No fracture. Surgical drain and staples are seen. There is gas in the soft  tissues from surgery. IMPRESSION: Status post right knee replacement.  No acute finding. Electronically Signed   By: Inge Rise M.D.   On: 03/02/2019 09:52    Disposition:     Follow-up Information    Duanne Guess, PA-C Follow up in 2 week(s).   Specialties:  Orthopedic Surgery, Emergency Medicine Contact information: [place] [address] [phone]            Signed:  [place] 03/04/2019, 8:54 AM

## 2019-03-04 NOTE — TOC Transition Note (Signed)
Transition of Care [facility]) - CM/SW Discharge Note   Patient Details  Name: Johnny Freeman MRN: [id number] Date of Birth: 09-Jul-1951  Transition of Care [facility]) CM/SW Contact:  Johnny Hilt, RN Phone Number: 03/04/2019, 9:10 AM   Clinical Narrative:    Patient to DC home with his wife, he has DME at home and has no other  DME needs, I notified Johnny that the patient is [name] today and Johnny Freeman stated they are ready, He is on Elequis and can afford medications, no further needs   Final next level of care: Home w Home Health Services Barriers to Discharge: Barriers Resolved   Patient Goals and CMS Choice Patient states their goals for this hospitalization and ongoing recovery are:: go home with his wife CMS Medicare.gov Compare Post Acute Care list provided to:: Patient Choice offered to / list presented to : Patient  Discharge Placement                       Discharge Plan and Services   Discharge Planning Services: CM Consult Post Acute Care Choice: Home Health          DME Arranged: Patient refused services         HH Arranged: PT [place] Agency: [facility] (formerly [employer]) Date [place]: 03/03/19 Time [place]: 1150 Representative spoke with at [place]: Johnny Freeman (Johnny Freeman) Interventions     Readmission Risk Interventions No flowsheet data found.

## 2019-03-04 NOTE — Progress Notes (Signed)
Physical Therapy Treatment Patient Details Name: Johnny Freeman MRN: [id number] DOB: 08/26/1951 Today's Date: 03/04/2019    History of Present Illness admitted for acute hospitalization s/p R TKR (03/02/19), WBAT.  Of note, s/p IVC filter placement 5/26 due to history of clotting issues after L TKR one year prior.    PT Comments    Pt presents with min deficits in strength, transfers, mobility, gait, balance, R knee ROM, and activity tolerance.  Pt was able to ambulate 150' with a RW and SBA with step-to gait pattern progressing to step-through as R knee pain improved.  Pt was able to ascend and descend two steps with bilateral rails and then with one rail with good stability with verbal and visual cues for sequencing.  Pt will benefit from HHPT services upon discharge to safely address above deficits for decreased caregiver assistance and eventual return to PLOF.     Follow Up Recommendations  Home health PT     Equipment Recommendations       Recommendations for Other Services       Precautions / Restrictions Precautions Precautions: Fall Restrictions Weight Bearing Restrictions: No RLE Weight Bearing: Weight bearing as tolerated    Mobility  Bed Mobility Overal bed mobility: Modified Independent             General bed mobility comments: Extra time and effort required but no physical assistance needed  Transfers Overall transfer level: Needs assistance Equipment used: Rolling walker (2 wheeled) Transfers: Sit to/from Stand Sit to Stand: Supervision         General transfer comment: Good eccentric and concentric control during transfers with min verbal cues needed for general sequencing.   Ambulation/Gait Ambulation/Gait assistance: Supervision Gait Distance (Feet): 150 Feet Assistive device: Rolling walker (2 wheeled) Gait Pattern/deviations: Step-through pattern;Antalgic;Step-to pattern     General Gait Details: Step-to pattern progressing to beginning  step-through pattern during the session with slow, antalgic cadence   Stairs Stairs: Yes Stairs assistance: Min guard Stair Management: Two rails;One rail Right Number of Stairs: 2 General stair comments: Ascend/descend 2 steps with B rails and then with right rail with increased effort required but pt was steady without buckling   Wheelchair Mobility    Modified Rankin (Stroke Patients Only)       Balance Overall balance assessment: Needs assistance Sitting-balance support: No upper extremity supported;Feet supported Sitting balance-Leahy Scale: Good     Standing balance support: Bilateral upper extremity supported Standing balance-Leahy Scale: Good                              Cognition Arousal/Alertness: Awake/alert Behavior During Therapy: WFL for tasks assessed/performed Overall Cognitive Status: Within Functional Limits for tasks assessed                                        Exercises Total Joint Exercises Ankle Circles/Pumps: AROM;Both;10 reps Quad Sets: AROM;Strengthening;Both;10 reps;15 reps Gluteal Sets: Strengthening;Both;10 reps Hip ABduction/ADduction: AROM;AAROM;Both;10 reps Straight Leg Raises: AROM;AAROM;Both;10 reps Long Arc Quad: AROM;Strengthening;Both;10 reps;15 reps Knee Flexion: AROM;Strengthening;Both;10 reps;15 reps Goniometric ROM: R knee AROM: 8-92 deg Other Exercises Other Exercises: Car transfer verbal education with simulation using room chair for general sequencing Other Exercises: HEP education and review    General Comments        Pertinent Vitals/Pain Pain Assessment: 0-10 Pain Score: 2  Pain  Location: R knee Pain Descriptors / Indicators: Aching;Sore Pain Intervention(s): Premedicated before session;Monitored during session    Home Living                      Prior Function            PT Goals (current goals can now be found in the care plan section) Progress towards PT goals:  Progressing toward goals    Frequency    BID      PT Plan Current plan remains appropriate    Co-evaluation              AM-PAC PT "6 Clicks" Mobility   Outcome Measure  Help needed turning from your back to your side while in a flat bed without using bedrails?: None Help needed moving from lying on your back to sitting on the side of a flat bed without using bedrails?: None Help needed moving to and from a bed to a chair (including a wheelchair)?: None Help needed standing up from a chair using your arms (e.g., wheelchair or bedside chair)?: A Little Help needed to walk in hospital room?: A Little Help needed climbing 3-5 steps with a railing? : A Little 6 Click Score: 21    End of Session Equipment Utilized During Treatment: Gait belt Activity Tolerance: Patient tolerated treatment well Patient left: in bed;with call bell/phone within reach;with bed alarm set;Other (comment)(pt declined up in chair in order to rest) Nurse Communication: Mobility status PT Visit Diagnosis: Muscle weakness (generalized) (M62.81);Difficulty in walking, not elsewhere classified (R26.2);Pain Pain - Right/Left: Right Pain - part of body: Knee     Time: 5003-7048 PT Time Calculation (min) (ACUTE ONLY): 45 min  Charges:  $Gait Training: 8-22 mins $Therapeutic Exercise: 8-22 mins $Therapeutic Activity: 8-22 mins                     D. Scott Selinda Korzeniewski PT, DPT 03/04/19, 1:00 PM

## 2019-03-04 NOTE — Progress Notes (Signed)
DISCHARGE NOTE:  Pt given discharge instructions and script. Pt verbalized understanding. Pt given extra honeycomb dressing, provina wound vac in place. Pt waiting on ride.

## 2019-03-04 NOTE — Progress Notes (Signed)
Pt wheeled to car by staff. 

## 2019-03-04 NOTE — Progress Notes (Signed)
   Subjective: 2 Days Post-Op Procedure(s) (LRB): TOTAL KNEE ARTHROPLASTY RIGHT (Right) Patient reports pain as mild.   Patient is well, and has had no acute complaints or problems Denies any CP, SOB, ABD pain. We will continue therapy today.  Plan is to go Home after hospital stay.  Objective: Vital signs in last 24 hours: Temp:  [98.1 F (36.7 C)-99.1 F (37.3 C)] 99.1 F (37.3 C) (06/04 0818) Pulse Rate:  [74-98] 91 (06/04 0818) Resp:  [16-20] 16 (06/04 0818) BP: (112-141)/(69-75) 117/70 (06/04 0818) SpO2:  [94 %-98 %] 94 % (06/04 0818)  Intake/Output from previous day: 06/03 0701 - 06/04 0700 In: 960 [P.O.:960] Out: 1925 [Urine:1925] Intake/Output this shift: No intake/output data recorded.  Recent Labs    03/03/19 0248  HGB 11.1*   Recent Labs    03/03/19 0248  WBC 10.3  RBC 3.90*  HCT 34.0*  PLT 187   Recent Labs    03/03/19 0248  NA 132*  K 4.1  CL 102  CO2 25  BUN 15  CREATININE 0.98  GLUCOSE 117*  CALCIUM 8.2*   No results for input(s): LABPT, INR in the last 72 hours.  EXAM General - Patient is Alert, Appropriate and Oriented Extremity - Neurovascular intact Sensation intact distally Intact pulses distally Dorsiflexion/Plantar flexion intact No cellulitis present Compartment soft Dressing - dressing C/D/I and no drainage, Praveena intact Motor Function - intact, moving foot and toes well on exam.   Past Medical History:  Diagnosis Date  . Anxiety   . Cancer [facility])    prostate 2011  . Complication of anesthesia   . Dyspnea   . GERD (gastroesophageal reflux disease)   . Headache    migraines  . Hypertension   . PE (pulmonary thromboembolism) ([place])   . PONV (postoperative nausea and vomiting)    nauseated after prostate bx HIFU     Assessment/Plan:   2 Days Post-Op Procedure(s) (LRB): TOTAL KNEE ARTHROPLASTY RIGHT (Right) Active Problems:   Status post total knee replacement using cement, right  Estimated body mass index is  31.92 kg/m as calculated from the following:   Height as of this encounter: 5\' 8"  (1.727 m).   Weight as of this encounter: 95.2 kg. Advance diet Up with therapy  Labs stable Vital signs stable Pain well controlled Needs bowel movement Care management to assist with discharge to home with home health PT.  Discharged home today pending bowel movement and completion of PT goals.  DVT Prophylaxis - Foot Pumps and TED hose, Eliquis  weight-Bearing as tolerated to right leg   T. Rachelle Hora, PA-C [place] 03/04/2019, 8:51 AM

## 2019-04-01 ENCOUNTER — Other Ambulatory Visit (INDEPENDENT_AMBULATORY_CARE_PROVIDER_SITE_OTHER): Payer: Self-pay | Admitting: Vascular Surgery

## 2019-04-01 DIAGNOSIS — Z86711 Personal history of pulmonary embolism: Secondary | ICD-10-CM

## 2019-04-01 DIAGNOSIS — Z86718 Personal history of other venous thrombosis and embolism: Secondary | ICD-10-CM

## 2019-04-01 DIAGNOSIS — Z95828 Presence of other vascular implants and grafts: Secondary | ICD-10-CM

## 2019-04-06 ENCOUNTER — Ambulatory Visit (INDEPENDENT_AMBULATORY_CARE_PROVIDER_SITE_OTHER): Payer: Managed Care, Other (non HMO) | Admitting: Nurse Practitioner

## 2019-04-06 ENCOUNTER — Ambulatory Visit (INDEPENDENT_AMBULATORY_CARE_PROVIDER_SITE_OTHER): Payer: Managed Care, Other (non HMO)

## 2019-04-06 ENCOUNTER — Other Ambulatory Visit: Payer: Self-pay

## 2019-04-06 ENCOUNTER — Encounter (INDEPENDENT_AMBULATORY_CARE_PROVIDER_SITE_OTHER): Payer: Self-pay | Admitting: Nurse Practitioner

## 2019-04-06 VITALS — BP 130/78 | HR 89 | Resp 14 | Ht 69.0 in | Wt 211.0 lb

## 2019-04-06 DIAGNOSIS — I10 Essential (primary) hypertension: Secondary | ICD-10-CM

## 2019-04-06 DIAGNOSIS — Z87891 Personal history of nicotine dependence: Secondary | ICD-10-CM

## 2019-04-06 DIAGNOSIS — Z95828 Presence of other vascular implants and grafts: Secondary | ICD-10-CM

## 2019-04-06 DIAGNOSIS — M1711 Unilateral primary osteoarthritis, right knee: Secondary | ICD-10-CM

## 2019-04-06 DIAGNOSIS — Z86711 Personal history of pulmonary embolism: Secondary | ICD-10-CM

## 2019-04-06 DIAGNOSIS — Z86718 Personal history of other venous thrombosis and embolism: Secondary | ICD-10-CM | POA: Diagnosis not present

## 2019-04-06 DIAGNOSIS — Z79899 Other long term (current) drug therapy: Secondary | ICD-10-CM

## 2019-04-06 DIAGNOSIS — Z7901 Long term (current) use of anticoagulants: Secondary | ICD-10-CM

## 2019-04-06 DIAGNOSIS — Z8546 Personal history of malignant neoplasm of prostate: Secondary | ICD-10-CM

## 2019-04-11 ENCOUNTER — Encounter (INDEPENDENT_AMBULATORY_CARE_PROVIDER_SITE_OTHER): Payer: Self-pay | Admitting: Nurse Practitioner

## 2019-04-11 NOTE — Progress Notes (Signed)
SUBJECTIVE:  Patient ID: Johnny Freeman, male    DOB: 11-23-1950, 68 y.o.   MRN: [id number] Chief Complaint  Patient presents with  . Follow-up    HPI  Johnny Freeman is a 68 y.o. male That had an IVC filter inserted on 02/23/2019 prior to right knee replacement.  At left knee replacement the patient had DVT and PE so this was done as a preventative measure.  Since the time the patient denies any signs and symptoms of DVT, only soreness related the recent replacement.  He denies any fever, chills, nausea, vomiting or diarrhea.  Bilateral lower extremity ultrasound revealed no evidence of DVT in the bilateral lower extremities with a possible enlarged lymph node in the left groin.    Past Medical History:  Diagnosis Date  . Anxiety   . Cancer [facility])    prostate 2011  . Complication of anesthesia   . Dyspnea   . GERD (gastroesophageal reflux disease)   . Headache    migraines  . Hypertension   . PE (pulmonary thromboembolism) ([place])   . PONV (postoperative nausea and vomiting)    nauseated after prostate bx HIFU    Past Surgical History:  Procedure Laterality Date  . CYSTOSCOPY N/A 11/04/2017   Procedure: CYSTOSCOPY;  Surgeon: Hessie Knows, MD;  Location: [facility] OR[facility];  Service: Orthopedics;  Laterality: N/A;  . IVC FILTER INSERTION N/A 02/23/2019   Procedure: IVC FILTER INSERTION;  Surgeon: Algernon Huxley, MD;  Location: [facility];  Service: Cardiovascular;  Laterality: N/A;  . PROSTATE SURGERY    . ROTATOR CUFF REPAIR Right 2003  . TOTAL KNEE ARTHROPLASTY Left 11/04/2017   Procedure: TOTAL KNEE ARTHROPLASTY;  Surgeon: Hessie Knows, MD;  Location: [facility];  Service: Orthopedics;  Laterality: Left;  . TOTAL KNEE ARTHROPLASTY Right 03/02/2019   Procedure: TOTAL KNEE ARTHROPLASTY RIGHT;  Surgeon: Hessie Knows, MD;  Location: [facility];  Service: Orthopedics;  Laterality: Right;    Social History   Socioeconomic History  . Marital status: Married    Spouse name: Not on  file  . Number of children: Not on file  . Years of education: Not on file  . Highest education level: Not on file  Occupational History  . Not on file  Social Needs  . Financial resource strain: Not on file  . Food insecurity    Worry: Not on file    Inability: Not on file  . Transportation needs    Medical: Not on file    Non-medical: Not on file  Tobacco Use  . Smoking status: Former Smoker    Quit date: 10/03/1972    Years since quitting: 46.5  . Smokeless tobacco: Never Used  Substance and Sexual Activity  . Alcohol use: No    Alcohol/week: 0.0 standard drinks    Frequency: Never  . Drug use: No  . Sexual activity: Not on file  Lifestyle  . Physical activity    Days per week: Not on file    Minutes per session: Not on file  . Stress: Not on file  Relationships  . Social Herbalist on phone: Not on file    Gets together: Not on file    Attends religious service: Not on file    Active member of club or organization: Not on file    Attends meetings of clubs or organizations: Not on file    Relationship status: Not on file  . Intimate partner violence  Fear of current or ex partner: Not on file    Emotionally abused: Not on file    Physically abused: Not on file    Forced sexual activity: Not on file  Other Topics Concern  . Not on file  Social History Narrative  . Not on file    Family History  Problem Relation Age of Onset  . Heart attack Father     Allergies  Allergen Reactions  . Shellfish Allergy Swelling    Swelling in eyes     Review of Systems   Review of Systems: Negative Unless Checked Constitutional: [] Weight loss  [] Fever  [] Chills Cardiac: [] Chest pain   []  Atrial Fibrillation  [] Palpitations   [] Shortness of breath when laying flat   [] Shortness of breath with exertion. [] Shortness of breath at rest Vascular:  [] Pain in legs with walking   [] Pain in legs with standing [] Pain in legs when laying flat   [] Claudication    [] Pain  in feet when laying flat    [x] History of DVT   [] Phlebitis   [x] Swelling in legs   [] Varicose veins   [] Non-healing ulcers Pulmonary:   [] Uses home oxygen   [] Productive cough   [] Hemoptysis   [] Wheeze  [] COPD   [] Asthma Neurologic:  [] Dizziness   [] Seizures  [] Blackouts [] History of stroke   [] History of TIA  [] Aphasia   [] Temporary Blindness   [] Weakness or numbness in arm   [] Weakness or numbness in leg Musculoskeletal:   [] Joint swelling   [] Joint pain   [] Low back pain  [x]  History of Knee Replacement [x] Arthritis [] back Surgeries  []  Spinal Stenosis    Hematologic:  [] Easy bruising  [] Easy bleeding   [] Hypercoagulable state   [] Anemic Gastrointestinal:  [] Diarrhea   [] Vomiting  [] Gastroesophageal reflux/heartburn   [] Difficulty swallowing. [] Abdominal pain Genitourinary:  [] Chronic kidney disease   [] Difficult urination  [] Anuric   [] Blood in urine [] Frequent urination  [] Burning with urination   [] Hematuria Skin:  [] Rashes   [] Ulcers [] Wounds Psychological:  [x] History of anxiety   []  History of major depression  []  Memory Difficulties      OBJECTIVE:   Physical Exam  BP 130/78 (BP Location: Left Arm, Patient Position: Sitting, Cuff Size: Large)   Pulse 89   Resp 14   Ht 5\' 9"  (1.753 m)   Wt 211 lb (95.7 kg)   BMI 31.16 kg/m   Gen: WD/WN, NAD Head: [place]/AT, No temporalis wasting.  Ear/Nose/Throat: Hearing grossly intact, nares w/o erythema or drainage Eyes: PER, EOMI, sclera nonicteric.  Neck: Supple, no masses.  No JVD.  Pulmonary:  Good air movement, no use of accessory muscles.  Cardiac: RRR Vascular:  Vessel Right Left  Radial Palpable Palpable   Gastrointestinal: soft, non-distended. No guarding/no peritoneal signs.  Musculoskeletal: M/S 5/5 throughout. Recent right knee replacement, using a cane.   Neurologic: Pain and light touch intact in extremities.  Symmetrical.  Speech is fluent. Motor exam as listed above. Psychiatric: Judgment intact, Mood & affect appropriate  for pt's clinical situation. Dermatologic: No Venous rashes. No Ulcers Noted.  No changes consistent with cellulitis. Lymph : No Cervical lymphadenopathy, no lichenification or skin changes of chronic lymphedema.       ASSESSMENT AND PLAN:  1. Arthritis of right knee Patient has resumed anticoagulation and has no evidence of DVT.  IVC filter is now eligible to be removed.  Risks, benefits and alternatives discussed with patient and his wife.  Patient wishes to wait several weeks to allow knee to  heal more.  Will contact our office when he is ready to have procedure scheduled.    2. Essential hypertension Continue antihypertensive medications as already ordered, these medications have been reviewed and there are no changes at this time.   3. History of prostate cancer The enlarge lymph node could be due to recent surgical intervention, however in an abundance of caution I have instructed the patient to follow up with PCP and urologist to determine if they feel further testing is warranted.  Patient and wife understand.     Current Outpatient Medications on File Prior to Visit  Medication Sig Dispense Refill  . acetaminophen (TYLENOL) 500 MG tablet Take 500 mg by mouth 2 (two) times daily as needed (FOR PAIN.).    Marland Kitchen apixaban (ELIQUIS) 5 MG TABS tablet Take 1 tablet (5 mg total) by mouth 2 (two) times daily. (Patient taking differently: Take 10 mg by mouth 2 (two) times daily. ) 180 tablet 0  . benazepril (LOTENSIN) 10 MG tablet Take 10 mg by mouth every morning.     . Cholecalciferol (VITAMIN D-3) 25 MCG (1000 UT) CAPS Take 1 tablet by mouth daily with lunch.     . diltiazem (CARDIZEM CD) 180 MG 24 hr capsule Take 180 mg by mouth every morning.     . docusate sodium (COLACE) 100 MG capsule Take 100 mg by mouth every other day.     . Multiple Vitamin (MULTIVITAMIN WITH MINERALS) TABS tablet Take 1 tablet by mouth daily.    Marland Kitchen omeprazole (PRILOSEC) 20 MG capsule Take 20 mg by mouth every  evening.   0  . oxyCODONE (OXY IR/ROXICODONE) 5 MG immediate release tablet Take 1-2 tablets (5-10 mg total) by mouth every 4 (four) hours as needed for moderate pain (pain score 4-6). 40 tablet 0  . Probiotic Product (PROBIOTIC-10 PO) Take 1 tablet by mouth daily.    Marland Kitchen triamcinolone cream (KENALOG) 0.1 % Apply 1 application topically 2 (two) times daily as needed (dry skin).      No current facility-administered medications on file prior to visit.     There are no Patient Instructions on file for this visit. Return if symptoms worsen or fail to improve.   Kris Hartmann, NP  This note was completed with Sales executive.  Any errors are purely unintentional.

## 2019-04-27 ENCOUNTER — Encounter (INDEPENDENT_AMBULATORY_CARE_PROVIDER_SITE_OTHER): Payer: Self-pay | Admitting: Vascular Surgery

## 2019-04-27 ENCOUNTER — Other Ambulatory Visit: Payer: Self-pay

## 2019-04-27 ENCOUNTER — Ambulatory Visit (INDEPENDENT_AMBULATORY_CARE_PROVIDER_SITE_OTHER): Payer: Managed Care, Other (non HMO) | Admitting: Vascular Surgery

## 2019-04-27 VITALS — BP 167/81 | HR 109 | Resp 12 | Ht 68.0 in | Wt 215.0 lb

## 2019-04-27 DIAGNOSIS — M1711 Unilateral primary osteoarthritis, right knee: Secondary | ICD-10-CM | POA: Diagnosis not present

## 2019-04-27 DIAGNOSIS — Z79899 Other long term (current) drug therapy: Secondary | ICD-10-CM

## 2019-04-27 DIAGNOSIS — E785 Hyperlipidemia, unspecified: Secondary | ICD-10-CM

## 2019-04-27 DIAGNOSIS — Z95828 Presence of other vascular implants and grafts: Secondary | ICD-10-CM | POA: Diagnosis not present

## 2019-04-27 DIAGNOSIS — I2699 Other pulmonary embolism without acute cor pulmonale: Secondary | ICD-10-CM | POA: Diagnosis not present

## 2019-04-27 DIAGNOSIS — I10 Essential (primary) hypertension: Secondary | ICD-10-CM

## 2019-04-27 NOTE — Assessment & Plan Note (Signed)
Previous.  No new symptoms with IVC filter placed around the time of his knee surgery.

## 2019-04-27 NOTE — Progress Notes (Signed)
MRN : [id number]  Johnny Freeman is a 68 y.o. (1951-07-14) male who presents with chief complaint of  Chief Complaint  Patient presents with  . Follow-up  .  History of Present Illness: Patient returns today in follow up of his IVC filter which was placed preliminary to a knee replacement approximately 8 weeks ago.  He is doing well.  His therapy has continued and he is scheduled to go back to work next week.  His incision is well-healed.  No worrisome leg swelling, chest pain, or shortness of breath following the procedure.  A recent duplex showed no evidence of DVT in the lower extremities.  Current Outpatient Medications  Medication Sig Dispense Refill  . acetaminophen (TYLENOL) 500 MG tablet Take 500 mg by mouth 2 (two) times daily as needed (FOR PAIN.).    Marland Kitchen apixaban (ELIQUIS) 5 MG TABS tablet Take 1 tablet (5 mg total) by mouth 2 (two) times daily. (Patient taking differently: Take 10 mg by mouth 2 (two) times daily. ) 180 tablet 0  . benazepril (LOTENSIN) 10 MG tablet Take 10 mg by mouth every morning.     . Cholecalciferol (VITAMIN D-3) 25 MCG (1000 UT) CAPS Take 1 tablet by mouth daily with lunch.     . diltiazem (CARDIZEM CD) 180 MG 24 hr capsule Take 180 mg by mouth every morning.     . docusate sodium (COLACE) 100 MG capsule Take 100 mg by mouth every other day.     . Multiple Vitamin (MULTIVITAMIN WITH MINERALS) TABS tablet Take 1 tablet by mouth daily.    Marland Kitchen omeprazole (PRILOSEC) 20 MG capsule Take 20 mg by mouth every evening.   0  . Probiotic Product (PROBIOTIC-10 PO) Take 1 tablet by mouth daily.    Marland Kitchen triamcinolone cream (KENALOG) 0.1 % Apply 1 application topically 2 (two) times daily as needed (dry skin).     Marland Kitchen oxyCODONE (OXY IR/ROXICODONE) 5 MG immediate release tablet Take 1-2 tablets (5-10 mg total) by mouth every 4 (four) hours as needed for moderate pain (pain score 4-6). (Patient not taking: Reported on 04/27/2019) 40 tablet 0   No current facility-administered  medications for this visit.     Past Medical History:  Diagnosis Date  . Anxiety   . Cancer [facility])    prostate 2011  . Complication of anesthesia   . Dyspnea   . GERD (gastroesophageal reflux disease)   . Headache    migraines  . Hypertension   . PE (pulmonary thromboembolism) ([place])   . PONV (postoperative nausea and vomiting)    nauseated after prostate bx HIFU    Past Surgical History:  Procedure Laterality Date  . CYSTOSCOPY N/A 11/04/2017   Procedure: CYSTOSCOPY;  Surgeon: Hessie Knows, MD;  Location: [facility] ORS;  Service: Orthopedics;  Laterality: N/A;  . IVC FILTER INSERTION N/A 02/23/2019   Procedure: IVC FILTER INSERTION;  Surgeon: Algernon Huxley, MD;  Location: [place] [facility];  Service: Cardiovascular;  Laterality: N/A;  . PROSTATE SURGERY    . ROTATOR CUFF REPAIR Right 2003  . TOTAL KNEE ARTHROPLASTY Left 11/04/2017   Procedure: TOTAL KNEE ARTHROPLASTY;  Surgeon: Hessie Knows, MD;  Location: [facility] ORS;  Service: Orthopedics;  Laterality: Left;  . TOTAL KNEE ARTHROPLASTY Right 03/02/2019   Procedure: TOTAL KNEE ARTHROPLASTY RIGHT;  Surgeon: Hessie Knows, MD;  Location: [facility] OR[facility];  Service: Orthopedics;  Laterality: Right;    Social History Social History   Tobacco Use  . Smoking status: Former  Smoker    Quit date: 10/03/1972    Years since quitting: 46.5  . Smokeless tobacco: Never Used  Substance Use Topics  . Alcohol use: No    Alcohol/week: 0.0 standard drinks    Frequency: Never  . Drug use: No    Family History Family History  Problem Relation Age of Onset  . Heart attack Father     Allergies  Allergen Reactions  . Shellfish Allergy Swelling    Swelling in eyes    REVIEW OF SYSTEMS (Negative unless checked)  Constitutional: [] ?Weight loss  [] ?Fever  [] ?Chills Cardiac: [] ?Chest pain   [] ?Chest pressure   [] ?Palpitations   [] ?Shortness of breath when laying flat   [] ?Shortness of breath at rest   [] ?Shortness of breath with exertion. Vascular:   [x] ?Pain in legs with walking   [x] ?Pain in legs at rest   [] ?Pain in legs when laying flat   [] ?Claudication   [] ?Pain in feet when walking  [] ?Pain in feet at rest  [] ?Pain in feet when laying flat   [x] ?History of DVT   [] ?Phlebitis   [] ?Swelling in legs   [] ?Varicose veins   [] ?Non-healing ulcers Pulmonary:   [] ?Uses home oxygen   [] ?Productive cough   [] ?Hemoptysis   [] ?Wheeze  [] ?COPD   [] ?Asthma Neurologic:  [] ?Dizziness  [] ?Blackouts   [] ?Seizures   [] ?History of stroke   [] ?History of TIA  [] ?Aphasia   [] ?Temporary blindness   [] ?Dysphagia   [] ?Weakness or numbness in arms   [] ?Weakness or numbness in legs Musculoskeletal:  [x] ?Arthritis   [] ?Joint swelling   [x] ?Joint pain   [] ?Low back pain Hematologic:  [] ?Easy bruising  [] ?Easy bleeding   [] ?Hypercoagulable state   [] ?Anemic  [] ?Hepatitis Gastrointestinal:  [] ?Blood in stool   [] ?Vomiting blood  [] ?Gastroesophageal reflux/heartburn   [] ?Abdominal pain Genitourinary:  [] ?Chronic kidney disease   [] ?Difficult urination  [] ?Frequent urination  [] ?Burning with urination   [] ?Hematuria Skin:  [] ?Rashes   [] ?Ulcers   [] ?Wounds Psychological:  [] ?History of anxiety   [] ? History of major depression.     Physical Examination  BP (!) 167/81 (BP Location: Left Arm, Patient Position: Sitting, Cuff Size: Normal)   Pulse (!) 109   Resp 12   Ht 5\' 8"  (1.727 m)   Wt 215 lb (97.5 kg)   BMI 32.69 kg/m  Gen:  WD/WN, NAD Head: [place]/AT, No temporalis wasting. Ear/Nose/Throat: Hearing grossly intact, nares w/o erythema or drainage Eyes: Conjunctiva clear. Sclera non-icteric Neck: Supple.  Trachea midline Pulmonary:  Good air movement, no use of accessory muscles.  Cardiac: RRR, no JVD Vascular:  Vessel Right Left  Radial Palpable Palpable                           Musculoskeletal: M/S 5/5 throughout.  No deformity or atrophy.  1-2+ right lower extremity edema, trace left lower extremity edema. Neurologic: Sensation grossly intact in  extremities.  Symmetrical.  Speech is fluent.  Psychiatric: Judgment intact, Mood & affect appropriate for pt's clinical situation. Dermatologic: No rashes or ulcers noted.  Incision from right knee replacement is healing well       Labs Recent Results (from the past 2160 hour(s))  Surgical pcr screen     Status: None   Collection Time: 02/19/19 10:23 AM   Specimen: Nasal Mucosa; Nasal Swab  Result Value Ref Range   MRSA, PCR NEGATIVE NEGATIVE   Staphylococcus aureus NEGATIVE NEGATIVE    Comment: (NOTE) The Xpert SA Assay (  FDA approved for NASAL specimens in patients 61 years of age and older), is one component of a comprehensive surveillance program. It is not intended to diagnose infection nor to guide or monitor treatment. Performed at [facility], [address] [place], [place] [address]   CBC     Status: None   Collection Time: 02/19/19 10:23 AM  Result Value Ref Range   WBC 5.4 4.0 - 10.5 K/uL   RBC 4.89 4.22 - 5.81 MIL/uL   Hemoglobin 14.1 13.0 - 17.0 g/dL   HCT 42.3 39.0 - 52.0 %   MCV 86.5 80.0 - 100.0 fL   MCH 28.8 26.0 - 34.0 pg   MCHC 33.3 30.0 - 36.0 g/dL   RDW 14.0 11.5 - 15.5 %   Platelets 232 150 - 400 K/uL   nRBC 0.0 0.0 - 0.2 %    Comment: Performed at [facility], [address] [place], [place] [address]  Basic metabolic panel     Status: Abnormal   Collection Time: 02/19/19 10:23 AM  Result Value Ref Range   Sodium 134 (L) 135 - 145 mmol/L   Potassium 4.1 3.5 - 5.1 mmol/L   Chloride 101 98 - 111 mmol/L   CO2 26 22 - 32 mmol/L   Glucose, Bld 111 (H) 70 - 99 mg/dL   BUN 13 8 - 23 mg/dL   Creatinine, Ser 0.88 0.61 - 1.24 mg/dL   Calcium 9.1 8.9 - 10.3 mg/dL   GFR calc non Af Amer >60 >60 mL/min   GFR calc Af Amer >60 >60 mL/min   Anion gap 7 5 - 15    Comment: Performed at [facility], [address] [place], [place] [address]  Protime-INR     Status: None   Collection Time: 02/19/19 10:23 AM  Result Value  Ref Range   Prothrombin Time 14.8 11.4 - 15.2 seconds   INR 1.2 0.8 - 1.2    Comment: (NOTE) INR goal varies based on device and disease states. Performed at [facility], [address] [place], [place] [address]   APTT     Status: None   Collection Time: 02/19/19 10:23 AM  Result Value Ref Range   aPTT 33 24 - 36 seconds    Comment: Performed at [facility], [address] [place], [place] [address]  Type and screen [place]     Status: None   Collection Time: 02/19/19 10:23 AM  Result Value Ref Range   ABO/RH(D) O POS    Antibody Screen NEG    Sample Expiration 03/05/2019,2359    Extend sample reason      NO TRANSFUSIONS OR PREGNANCY IN THE PAST 3 MONTHS Performed at [facility], [address] [place],  [address]   Urinalysis, Routine w reflex microscopic     Status: Abnormal   Collection Time: 02/19/19 10:23 AM  Result Value Ref Range   Color, Urine YELLOW (A) YELLOW   APPearance CLOUDY (A) CLEAR   Specific Gravity, Urine 1.009 1.005 - 1.030   pH 7.0 5.0 - 8.0   Glucose, UA NEGATIVE NEGATIVE mg/dL   Hgb urine dipstick NEGATIVE NEGATIVE   Bilirubin Urine NEGATIVE NEGATIVE   Ketones, ur NEGATIVE NEGATIVE mg/dL   Protein, ur NEGATIVE NEGATIVE mg/dL   Nitrite NEGATIVE NEGATIVE   Leukocytes,Ua NEGATIVE NEGATIVE   RBC / HPF 0-5 0 - 5 RBC/hpf   WBC, UA 0-5 0 - 5 WBC/hpf   Bacteria, UA  NONE SEEN NONE SEEN   Squamous Epithelial / LPF NONE SEEN 0 - 5   Mucus PRESENT     Comment: Performed at [facility], [address], [place], [place] [address]  Urine culture     Status: None   Collection Time: 02/19/19 10:23 AM   Specimen: Urine, Clean Catch  Result Value Ref Range   Specimen Description      URINE, CLEAN CATCH Performed at [facility], [address], [place], [place] [address]    Special Requests      NONE Performed at [facility], [address] [place],  [place] [address]    Culture      NO GROWTH Performed at [facility], [place] [address] [place], [place] [address]    Report Status 02/20/2019 FINAL   Novel Coronavirus, NAA (hospital order; send-out to ref lab)     Status: None   Collection Time: 02/19/19 10:23 AM   Specimen: Nasopharyngeal Swab; Respiratory  Result Value Ref Range   SARS-CoV-2, NAA NOT DETECTED NOT DETECTED    Comment: (NOTE) Testing was performed using the cobas(R) SARS-CoV-2 test. This test was developed and its performance characteristics determined by [employer]. This test has not been FDA cleared or approved. This test has been authorized by FDA under an Emergency Use Authorization (EUA). This test is only authorized for the duration of time the declaration that circumstances exist justifying the authorization of the emergency use of in vitro diagnostic tests for detection of SARS-CoV-2 virus and/or diagnosis of COVID-19 infection under section 564(b)(1) of the Act, 21 U.S.C. 950DTO-6(Z)(1), unless the authorization is terminated or revoked sooner. When diagnostic testing is negative, the possibility of a false negative result should be considered in the context of a patient's recent exposures and the presence of clinical signs and symptoms consistent with COVID-19. An individual without symptoms of COVID-19 and who is not shedding SARS-CoV-2 virus would expect to have  a negative (not detected) result in this assay. Performed At: [facility] [address] [place], [place] [id number] Rush Farmer MD JA:[id number]    Coronavirus Source NASOPHARYNGEAL     Comment: Performed at [facility], [address] [place], [place] [address]  Sedimentation rate     Status: None   Collection Time: 02/19/19 10:23 AM  Result Value Ref Range   Sed Rate 6 0 - 20 mm/hr    Comment: Performed at [facility], [address] [place], [place] [address]  SARS Coronavirus 2 (CEPHEID -  Performed in [place] hospital lab), Hosp Order     Status: None   Collection Time: 03/01/19 11:06 AM   Specimen: Nasopharyngeal Swab  Result Value Ref Range   SARS Coronavirus 2 NEGATIVE NEGATIVE    Comment: (NOTE) If result is NEGATIVE SARS-CoV-2 target nucleic acids are NOT DETECTED. The SARS-CoV-2 RNA is generally detectable in upper and lower  respiratory specimens during the acute phase of infection. The lowest  concentration of SARS-CoV-2 viral copies this assay can detect is 250  copies / mL. A negative result does not preclude SARS-CoV-2 infection  and should not be used as the sole basis for treatment or other  patient management decisions.  A negative result may occur with  improper specimen collection / handling, submission of specimen other  than nasopharyngeal swab, presence of viral mutation(s) within the  areas targeted by this assay, and inadequate number of viral copies  (<250 copies / mL). A negative  result must be combined with clinical  observations, patient history, and epidemiological information. If result is POSITIVE SARS-CoV-2 target nucleic acids are DETECTED. The SARS-CoV-2 RNA is generally detectable in upper and lower  respiratory specimens dur ing the acute phase of infection.  Positive  results are indicative of active infection with SARS-CoV-2.  Clinical  correlation with patient history and other diagnostic information is  necessary to determine patient infection status.  Positive results do  not rule out bacterial infection or co-infection with other viruses. If result is PRESUMPTIVE POSTIVE SARS-CoV-2 nucleic acids MAY BE PRESENT.   A presumptive positive result was obtained on the submitted specimen  and confirmed on repeat testing.  While 2019 novel coronavirus  (SARS-CoV-2) nucleic acids may be present in the submitted sample  additional confirmatory testing may be necessary for epidemiological  and / or clinical management purposes  to  differentiate between  SARS-CoV-2 and other Sarbecovirus currently known to infect humans.  If clinically indicated additional testing with an alternate test  methodology [phone]) is advised. The SARS-CoV-2 RNA is generally  detectable in upper and lower respiratory sp ecimens during the acute  phase of infection. The expected result is Negative. Fact Sheet for Patients:  StrictlyIdeas.no Fact Sheet for Healthcare Providers: BankingDealers.co.za This test is not yet approved or cleared by the [place] FDA and has been authorized for detection and/or diagnosis of SARS-CoV-2 by FDA under an Emergency Use Authorization (EUA).  This EUA will remain in effect (meaning this test can be used) for the duration of the COVID-19 declaration under Section 564(b)(1) of the Act, 21 U.S.C. section 360bbb-3(b)(1), unless the authorization is terminated or revoked sooner. Performed at [facility], [address] [place], [place] [address]   CBC     Status: Abnormal   Collection Time: 03/03/19  2:48 AM  Result Value Ref Range   WBC 10.3 4.0 - 10.5 K/uL   RBC 3.90 (L) 4.22 - 5.81 MIL/uL   Hemoglobin 11.1 (L) 13.0 - 17.0 g/dL   HCT 34.0 (L) 39.0 - 52.0 %   MCV 87.2 80.0 - 100.0 fL   MCH 28.5 26.0 - 34.0 pg   MCHC 32.6 30.0 - 36.0 g/dL   RDW 14.1 11.5 - 15.5 %   Platelets 187 150 - 400 K/uL   nRBC 0.0 0.0 - 0.2 %    Comment: Performed at [facility], [address] [place], [place] [address]  Basic metabolic panel     Status: Abnormal   Collection Time: 03/03/19  2:48 AM  Result Value Ref Range   Sodium 132 (L) 135 - 145 mmol/L   Potassium 4.1 3.5 - 5.1 mmol/L   Chloride 102 98 - 111 mmol/L   CO2 25 22 - 32 mmol/L   Glucose, Bld 117 (H) 70 - 99 mg/dL   BUN 15 8 - 23 mg/dL   Creatinine, Ser 0.98 0.61 - 1.24 mg/dL   Calcium 8.2 (L) 8.9 - 10.3 mg/dL   GFR calc non Af Amer >60 >60 mL/min   GFR calc Af Amer >60 >60  mL/min   Anion gap 5 5 - 15    Comment: Performed at [facility], [address], [place], [place] [address]    Radiology Vas [place] Lower Extremity Venous (dvt)  Result Date: 04/06/2019  Lower Venous Study Other Indications: 02/23/2019: IVC Filter placed. S/P IVC placement. Risk Factors: Hx of PE DVT Follow-up DVT. Performing Technologist: Almira Coaster RVS  Examination Guidelines: A complete  evaluation includes B-mode imaging, spectral Doppler, color Doppler, and power Doppler as needed of all accessible portions of each vessel. Bilateral testing is considered an integral part of a complete examination. Limited examinations for reoccurring indications may be performed as noted.  +---------+---------------+---------+-----------+----------+-------+ RIGHT    CompressibilityPhasicitySpontaneityPropertiesSummary +---------+---------------+---------+-----------+----------+-------+ CFV      Full           Yes      Yes                          +---------+---------------+---------+-----------+----------+-------+ SFJ      Full           Yes      Yes                          +---------+---------------+---------+-----------+----------+-------+ FV Prox  Full           Yes      Yes                          +---------+---------------+---------+-----------+----------+-------+ FV Mid   Full           Yes      Yes                          +---------+---------------+---------+-----------+----------+-------+ FV DistalFull           Yes      Yes                          +---------+---------------+---------+-----------+----------+-------+ PFV      Full           Yes      Yes                          +---------+---------------+---------+-----------+----------+-------+ POP      Full           Yes      Yes                          +---------+---------------+---------+-----------+----------+-------+ PTV      Full           Yes      Yes                           +---------+---------------+---------+-----------+----------+-------+ PERO     Full           Yes      Yes                          +---------+---------------+---------+-----------+----------+-------+ GSV      Full           Yes      Yes                          +---------+---------------+---------+-----------+----------+-------+ SSV      Full           Yes      Yes                          +---------+---------------+---------+-----------+----------+-------+   +---------+---------------+---------+-----------+----------+-------+ LEFT     CompressibilityPhasicitySpontaneityPropertiesSummary +---------+---------------+---------+-----------+----------+-------+ CFV  Full           Yes      Yes                          +---------+---------------+---------+-----------+----------+-------+ SFJ      Full           Yes      Yes                          +---------+---------------+---------+-----------+----------+-------+ FV Prox  Full           Yes      Yes                          +---------+---------------+---------+-----------+----------+-------+ FV Mid   Full           Yes      Yes                          +---------+---------------+---------+-----------+----------+-------+ FV DistalFull           Yes      Yes                          +---------+---------------+---------+-----------+----------+-------+ PFV      Full           Yes      Yes                          +---------+---------------+---------+-----------+----------+-------+ POP      Full           Yes      Yes                          +---------+---------------+---------+-----------+----------+-------+ PTV      Full           Yes      Yes                          +---------+---------------+---------+-----------+----------+-------+ PERO     Full           Yes      Yes                          +---------+---------------+---------+-----------+----------+-------+ GSV      Full            Yes      Yes                          +---------+---------------+---------+-----------+----------+-------+ SSV      Full           Yes      Yes                          +---------+---------------+---------+-----------+----------+-------+     Summary: Right: There is no evidence of deep vein thrombosis in the lower extremity.There is no evidence of superficial venous thrombosis. Left: There is no evidence of deep vein thrombosis in the lower extremity.There is no evidence of superficial venous thrombosis. Ultrasound characteristics of enlarged lymph nodes noted in the groin.  *See table(s) above for measurements and observations. Electronically signed by  Leotis Pain MD on 04/06/2019 at 5:10:19 PM.    Final     Assessment/Plan Hypertension blood pressure control important in reducing the progression of atherosclerotic disease. On appropriate oral medications.   Hyperlipidemia lipid control important in reducing the progression of atherosclerotic disease. Continue statin therapy   Arthritis of right knee About 8 weeks status post right total knee replacement and has done well.  No perioperative thromboembolic complications.  At least 80% of his mobility has already returned and he is back to work next week.  Pulmonary embolus (HCC) Previous.  No new symptoms with IVC filter placed around the time of his knee surgery.  S/P IVC filter The patient had a recent duplex which was negative for DVT.  He is nearly completely recovered from his knee replacement and doing reasonably well.  At this point, I think we can remove his IVC filter safely.  After that, if his primary care physician decides he can come off of anticoagulation, I would be okay with that.  He should continue anticoagulation until the filter is removed.  Risks and benefits of IVC filter removal were discussed with the patient and he is agreeable to proceed.    Leotis Pain, MD  04/27/2019 3:05 PM    This note  was created with Dragon medical transcription system.  Any errors from dictation are purely unintentional

## 2019-04-27 NOTE — Assessment & Plan Note (Signed)
The patient had a recent duplex which was negative for DVT.  He is nearly completely recovered from his knee replacement and doing reasonably well.  At this point, I think we can remove his IVC filter safely.  After that, if his primary care physician decides he can come off of anticoagulation, I would be okay with that.  He should continue anticoagulation until the filter is removed.  Risks and benefits of IVC filter removal were discussed with the patient and he is agreeable to proceed.

## 2019-04-28 ENCOUNTER — Telehealth (INDEPENDENT_AMBULATORY_CARE_PROVIDER_SITE_OTHER): Payer: Self-pay

## 2019-04-28 NOTE — Telephone Encounter (Signed)
Spoke with the patient and he is scheduled with Dr. Lucky Cowboy for 05/10/2019 with a 6:45 am arrival time to the [facility]. Patient will Covid testing on 05/07/2019 between 12:30-2:30 pm at the [place]. Pre-procedure instructions will be mailed to the patient.

## 2019-05-03 ENCOUNTER — Other Ambulatory Visit: Payer: Self-pay | Admitting: Hematology and Oncology

## 2019-05-03 DIAGNOSIS — I2699 Other pulmonary embolism without acute cor pulmonale: Secondary | ICD-10-CM

## 2019-05-07 ENCOUNTER — Other Ambulatory Visit
Admission: RE | Admit: 2019-05-07 | Discharge: 2019-05-07 | Disposition: A | Discharge status: Home / Self Care | Payer: Managed Care, Other (non HMO) | Source: Ambulatory Visit | Attending: Vascular Surgery | Admitting: Vascular Surgery

## 2019-05-07 ENCOUNTER — Other Ambulatory Visit: Payer: Self-pay

## 2019-05-07 DIAGNOSIS — Z20828 Contact with and (suspected) exposure to other viral communicable diseases: Secondary | ICD-10-CM | POA: Diagnosis not present

## 2019-05-08 LAB — SARS CORONAVIRUS 2 (TAT 6-24 HRS): SARS Coronavirus 2: NEGATIVE

## 2019-05-09 ENCOUNTER — Other Ambulatory Visit (INDEPENDENT_AMBULATORY_CARE_PROVIDER_SITE_OTHER): Payer: Self-pay | Admitting: Nurse Practitioner

## 2019-05-10 ENCOUNTER — Other Ambulatory Visit: Payer: Self-pay

## 2019-05-10 ENCOUNTER — Ambulatory Visit
Admission: RE | Admit: 2019-05-10 | Discharge: 2019-05-10 | Disposition: A | Discharge status: Home / Self Care | Payer: Managed Care, Other (non HMO) | Attending: Vascular Surgery | Admitting: Vascular Surgery

## 2019-05-10 ENCOUNTER — Encounter
Admission: RE | Disposition: A | Discharge status: Home / Self Care | Payer: Self-pay | Source: Home / Self Care | Attending: Vascular Surgery

## 2019-05-10 DIAGNOSIS — I70262 Atherosclerosis of native arteries of extremities with gangrene, left leg: Secondary | ICD-10-CM

## 2019-05-10 DIAGNOSIS — I82409 Acute embolism and thrombosis of unspecified deep veins of unspecified lower extremity: Secondary | ICD-10-CM | POA: Diagnosis not present

## 2019-05-10 DIAGNOSIS — Z09 Encounter for follow-up examination after completed treatment for conditions other than malignant neoplasm: Secondary | ICD-10-CM | POA: Diagnosis not present

## 2019-05-10 DIAGNOSIS — Z452 Encounter for adjustment and management of vascular access device: Secondary | ICD-10-CM | POA: Insufficient documentation

## 2019-05-10 DIAGNOSIS — Z86718 Personal history of other venous thrombosis and embolism: Secondary | ICD-10-CM | POA: Diagnosis not present

## 2019-05-10 HISTORY — DX: Malignant neoplasm of prostate: C61

## 2019-05-10 HISTORY — PX: IVC FILTER REMOVAL: CATH118246

## 2019-05-10 SURGERY — IVC FILTER REMOVAL
Anesthesia: Moderate Sedation

## 2019-05-10 MED ORDER — DIPHENHYDRAMINE HCL 50 MG/ML IJ SOLN: 50.0000 mg | Freq: Once | INTRAMUSCULAR | Status: DC | PRN

## 2019-05-10 MED ORDER — SODIUM CHLORIDE 0.9 % IV SOLN
INTRAVENOUS | Status: DC
  Administered 2019-05-10: 07:00:00 via INTRAVENOUS

## 2019-05-10 MED ORDER — MIDAZOLAM HCL 5 MG/5ML IJ SOLN
INTRAMUSCULAR | Status: AC
  Filled 2019-05-10: qty 5

## 2019-05-10 MED ORDER — METHYLPREDNISOLONE SODIUM SUCC 125 MG IJ SOLR
125.0000 mg | Freq: Once | INTRAMUSCULAR | Status: AC | PRN
  Administered 2019-05-10: 125 mg via INTRAVENOUS

## 2019-05-10 MED ORDER — FENTANYL CITRATE (PF) 100 MCG/2ML IJ SOLN
INTRAMUSCULAR | Status: AC
  Filled 2019-05-10: qty 2

## 2019-05-10 MED ORDER — MIDAZOLAM HCL 2 MG/2ML IJ SOLN
INTRAMUSCULAR | Status: DC | PRN
  Administered 2019-05-10: 2 mg via INTRAVENOUS

## 2019-05-10 MED ORDER — IODIXANOL 320 MG/ML IV SOLN
INTRAVENOUS | Status: DC | PRN
  Administered 2019-05-10: 15 mL via INTRAVENOUS

## 2019-05-10 MED ORDER — DIPHENHYDRAMINE HCL 50 MG/ML IJ SOLN
INTRAMUSCULAR | Status: AC
  Filled 2019-05-10: qty 1

## 2019-05-10 MED ORDER — CEFAZOLIN SODIUM-DEXTROSE 2-4 GM/100ML-% IV SOLN
2.0000 g | Freq: Once | INTRAVENOUS | Status: AC
  Administered 2019-05-10: 2 g via INTRAVENOUS

## 2019-05-10 MED ORDER — HYDROMORPHONE HCL 1 MG/ML IJ SOLN: 1.0000 mg | Freq: Once | INTRAMUSCULAR | Status: DC | PRN

## 2019-05-10 MED ORDER — FAMOTIDINE 20 MG PO TABS: 40.0000 mg | ORAL_TABLET | Freq: Once | ORAL | Status: DC | PRN

## 2019-05-10 MED ORDER — MIDAZOLAM HCL 2 MG/ML PO SYRP: 8.0000 mg | ORAL_SOLUTION | Freq: Once | ORAL | Status: DC | PRN

## 2019-05-10 MED ORDER — METHYLPREDNISOLONE SODIUM SUCC 125 MG IJ SOLR
INTRAMUSCULAR | Status: AC
  Administered 2019-05-10: 125 mg via INTRAVENOUS
  Filled 2019-05-10: qty 2

## 2019-05-10 MED ORDER — CEFAZOLIN SODIUM-DEXTROSE 2-4 GM/100ML-% IV SOLN
INTRAVENOUS | Status: AC
  Filled 2019-05-10: qty 100

## 2019-05-10 MED ORDER — DIPHENHYDRAMINE HCL 50 MG/ML IJ SOLN
INTRAMUSCULAR | Status: DC | PRN
  Administered 2019-05-10: 25 mg via INTRAVENOUS

## 2019-05-10 MED ORDER — ONDANSETRON HCL 4 MG/2ML IJ SOLN: 4.0000 mg | Freq: Four times a day (QID) | INTRAMUSCULAR | Status: DC | PRN

## 2019-05-10 MED ORDER — FENTANYL CITRATE (PF) 100 MCG/2ML IJ SOLN
INTRAMUSCULAR | Status: DC | PRN
  Administered 2019-05-10: 50 ug via INTRAVENOUS

## 2019-05-10 SURGICAL SUPPLY — 3 items
PACK ANGIOGRAPHY (CUSTOM PROCEDURE TRAY) ×3 IMPLANT
SET VENACAVA FILTER RETRIEVAL (MISCELLANEOUS) ×3 IMPLANT
WIRE J 3MM .035X145CM (WIRE) ×3 IMPLANT

## 2019-05-10 NOTE — H&P (Signed)
[facility] History & Physical Update  The patient was interviewed and re-examined.  The patient's previous History and Physical has been reviewed and is unchanged.  There is no change in the plan of care. We plan to proceed with the scheduled procedure.  Leotis Pain, MD  05/10/2019, 7:40 AM

## 2019-05-10 NOTE — Discharge Instructions (Signed)
Inferior Vena Cava Filter Removal, Care After This sheet gives you information about how to care for yourself after your procedure. Your health care provider may also give you more specific instructions. If you have problems or questions, contact your health care provider. What can I expect after the procedure? After the procedure, it is common to have:  Mild pain and bruising around your incision in your neck or groin.  Fatigue. Follow these instructions at home: Incision care   Follow instructions from your health care provider about how to take care of your incision. Make sure you: ? Wash your hands with soap and water before you change your bandage (dressing). If soap and water are not available, use hand sanitizer. ? Change your dressing as told by your health care provider.  Check your incision area every day for signs of infection. Check for: ? Redness, swelling, or more pain. ? Fluid or blood. ? Warmth. ? Pus or a bad smell. General instructions  Take over-the-counter and prescription medicines only as told by your health care provider.  Do not take baths, swim, or use a hot tub until your health care provider approves. Ask your health care provider if you may take showers. You may only be allowed to take sponge baths.  Do not drive for 24 hours if you were given a medicine to help you relax (sedative) during your procedure.  Return to your normal activities as told by your health care provider. Ask your health care provider what activities are safe for you.  Keep all follow-up visits as told by your health care provider. This is important. Contact a health care provider if:  You have chills or a fever.  You have redness, swelling, or more pain around your incision.  Your incision feels warm to the touch.  You have pus or a bad smell coming from your incision. Get help right away if:  You have blood coming from your incision (active bleeding). ? If you have  bleeding from the incision site, lie down, apply pressure to the area with a clean cloth or gauze, and get help right away.  You have chest pain.  You have difficulty breathing. Summary  Follow instructions from your health care provider about how to take care of your incision.  Return to your normal activities as told by your health care provider.  Check your incision area every day for signs of infection.  Get help right away if you have active bleeding, chest pain, or trouble breathing. This information is not intended to replace advice given to you by your health care provider. Make sure you discuss any questions you have with your health care provider. Document Released: 03/27/2017 Document Revised: 08/29/2017 Document Reviewed: 03/27/2017 Elsevier Patient Education  [employer].    Moderate Conscious Sedation, Adult, Care After These instructions provide you with information about caring for yourself after your procedure. Your health care provider may also give you more specific instructions. Your treatment has been planned according to current medical practices, but problems sometimes occur. Call your health care provider if you have any problems or questions after your procedure. What can I expect after the procedure? After your procedure, it is common:  To feel sleepy for several hours.  To feel clumsy and have poor balance for several hours.  To have poor judgment for several hours.  To vomit if you eat too soon. Follow these instructions at home: For at least 24 hours after the procedure:  Do not: ? Participate in activities where you could fall or become injured. ? Drive. ? Use heavy machinery. ? Drink alcohol. ? Take sleeping pills or medicines that cause drowsiness. ? Make important decisions or sign legal documents. ? Take care of children on your own.  Rest. Eating and drinking  Follow the diet recommended by your health care provider.  If you  vomit: ? Drink water, juice, or soup when you can drink without vomiting. ? Make sure you have little or no nausea before eating solid foods. General instructions  Have a responsible adult stay with you until you are awake and alert.  Take over-the-counter and prescription medicines only as told by your health care provider.  If you smoke, do not smoke without supervision.  Keep all follow-up visits as told by your health care provider. This is important. Contact a health care provider if:  You keep feeling nauseous or you keep vomiting.  You feel light-headed.  You develop a rash.  You have a fever. Get help right away if:  You have trouble breathing. This information is not intended to replace advice given to you by your health care provider. Make sure you discuss any questions you have with your health care provider. Document Released: 07/07/2013 Document Revised: 08/29/2017 Document Reviewed: 01/06/2016 Elsevier Patient Education  2020 [employer].

## 2019-05-10 NOTE — Op Note (Signed)
[facility]   OPERATIVE NOTE    PRE-OPERATIVE DIAGNOSIS:  1. DVT 2. status post IVC filter placement  POST-OPERATIVE DIAGNOSIS: Same as above  PROCEDURE: 1. Ultrasound guidance for vascular access right jugular vein 2. Catheter placement into inferior vena cava from right jugular vein 3. Inferior venacavogram 4. Retrieval of Cook Celect IVC filter  SURGEON: Leotis Pain, MD  ASSISTANT(S): None  ANESTHESIA: Local with moderate conscious sedation for approximately 15 minutes using 2 mg of Versed and 50 mcg of Fentanyl  ESTIMATED BLOOD LOSS: 5 cc  CONTRAST:  15 cc  FLUORO TIME:  1 minute  FINDING(S): 1. patent IVC  SPECIMEN(S): IVC filter  INDICATIONS:  Patient is a 68 y.o. male who presents with a previous history of IVC filter placement preliminary to knee replacement. Patient has had his knee replacement and no longer needs this filter. The patient remains on anticoagulation. Risks and benefits were discussed, and informed consent was obtained.  DESCRIPTION: After obtaining full informed written consent, the patient was brought back to the vascular suite and placed supine upon the table.Moderate conscious sedation was administered during a face to face encounter with the patient throughout the procedure with my supervision of the RN administering medicines and monitoring the patient's vital signs, pulse oximetry, telemetry and mental status throughout from the start of the procedure until the patient was taken to the recovery room.  After obtaining adequate anesthesia, the patient was prepped and draped in the standard fashion. The right jugular vein was visualized with ultrasound and found to be widely patent. It was then accessed under direct ultrasound guidance without difficulty with the Seldinger needle and a permanent image was recorded. A J-wire was placed. After skin nick and dilatation, the retrieval sheath was placed over the wire  and advanced into the inferior vena cava. Inferior vena cava was imaged and found to be widely patent on inferior venacavogram. The filter was straight in its orientation. The retrieval snare was then placed through the sheath and the hook of the filter was snared without difficulty. The sheath was then advanced, and the filter was collapsed and brought into the sheath in its entirety. It was then removed from the body in its entirety. The retrieval sheath was then removed. Pressure was held at the access site and sterile dressing was placed. The patient was taken to the recovery room in stable condition having tolerated the procedure well.  COMPLICATIONS: None  CONDITION: Stable   Leotis Pain 05/10/2019 8:16 AM  This note was created with Dragon Medical transcription system. Any errors in dictation are purely unintentional.

## 2019-06-17 ENCOUNTER — Encounter: Payer: Self-pay | Admitting: Adult Health

## 2019-06-17 ENCOUNTER — Ambulatory Visit: Payer: Managed Care, Other (non HMO) | Admitting: Adult Health

## 2019-06-17 ENCOUNTER — Other Ambulatory Visit: Payer: Self-pay

## 2019-06-17 VITALS — BP 142/62 | HR 84 | Temp 98.4°F | Resp 16 | Ht 68.0 in | Wt 211.0 lb

## 2019-06-17 DIAGNOSIS — Z0189 Encounter for other specified special examinations: Secondary | ICD-10-CM

## 2019-06-17 DIAGNOSIS — Z008 Encounter for other general examination: Secondary | ICD-10-CM

## 2019-06-17 NOTE — Progress Notes (Signed)
Subjective:    Patient ID: Johnny Freeman, male    DOB: [birth year], 68 y.o.   MRN: [id number]  HPI 68 yo male in non acute distress , comes in for Biometric screening. No complaints today.  Blood pressure (!) 142/62, pulse 84, temperature 98.4 F (36.9 C), temperature source Oral, resp. rate 16, height 5\' 8"  (1.727 m), weight 211 lb (95.7 kg), SpO2 97 %.  Allergies  Allergen Reactions  . Shellfish Allergy Swelling    Swelling in eyes   Past Medical History:  Diagnosis Date  . Anxiety   . Cancer [facility])    prostate 2011  . Complication of anesthesia   . Dyspnea   . GERD (gastroesophageal reflux disease)   . Headache    migraines  . Hypertension   . PE (pulmonary thromboembolism) ([place])   . PONV (postoperative nausea and vomiting)    nauseated after prostate bx HIFU  . Prostate cancer ([place])      Current Outpatient Medications:  .  acetaminophen (TYLENOL) 500 MG tablet, Take 500 mg by mouth 2 (two) times daily as needed (FOR PAIN.)., Disp: , Rfl:  .  apixaban (ELIQUIS) 5 MG TABS tablet, Take 1 tablet (5 mg total) by mouth 2 (two) times daily. (Patient taking differently: Take 10 mg by mouth 2 (two) times daily. ), Disp: 180 tablet, Rfl: 0 .  benazepril (LOTENSIN) 10 MG tablet, Take 10 mg by mouth every morning. , Disp: , Rfl:  .  Cholecalciferol (VITAMIN D-3) 25 MCG (1000 UT) CAPS, Take 1 tablet by mouth daily with lunch. , Disp: , Rfl:  .  diltiazem (CARDIZEM CD) 180 MG 24 hr capsule, Take 180 mg by mouth every morning. , Disp: , Rfl:  .  docusate sodium (COLACE) 100 MG capsule, Take 100 mg by mouth every other day. , Disp: , Rfl:  .  Multiple Vitamin (MULTIVITAMIN WITH MINERALS) TABS tablet, Take 1 tablet by mouth daily., Disp: , Rfl:  .  omeprazole (PRILOSEC) 20 MG capsule, Take 20 mg by mouth every evening. , Disp: , Rfl: 0 .  Probiotic Product (PROBIOTIC-10 PO), Take 1 tablet by mouth daily., Disp: , Rfl:  .  triamcinolone cream (KENALOG) 0.1 %, Apply 1 application  topically 2 (two) times daily as needed (dry skin). , Disp: , Rfl:   Past Medical History:  Diagnosis Date  . Anxiety   . Cancer [facility])    prostate 2011  . Complication of anesthesia   . Dyspnea   . GERD (gastroesophageal reflux disease)   . Headache    migraines  . Hypertension   . PE (pulmonary thromboembolism) ([place])   . PONV (postoperative nausea and vomiting)    nauseated after prostate bx HIFU  . Prostate cancer [facility])    Past Surgical History:  Procedure Laterality Date  . CYSTOSCOPY N/A 11/04/2017   Procedure: CYSTOSCOPY;  Surgeon: Hessie Knows, MD;  Location: [facility] ORS;  Service: Orthopedics;  Laterality: N/A;  . IVC FILTER INSERTION N/A 02/23/2019   Procedure: IVC FILTER INSERTION;  Surgeon: Algernon Huxley, MD;  Location: [place] [facility];  Service: Cardiovascular;  Laterality: N/A;  . IVC FILTER REMOVAL N/A 05/10/2019   Procedure: IVC FILTER REMOVAL;  Surgeon: Algernon Huxley, MD;  Location: [place] [facility];  Service: Cardiovascular;  Laterality: N/A;  . PROSTATE SURGERY    . ROTATOR CUFF REPAIR Right 2003  . TOTAL KNEE ARTHROPLASTY Left 11/04/2017   Procedure: TOTAL KNEE ARTHROPLASTY;  Surgeon: Hessie Knows, MD;  Location: [facility] ORS;  Service: Orthopedics;  Laterality: Left;  . TOTAL KNEE ARTHROPLASTY Right 03/02/2019   Procedure: TOTAL KNEE ARTHROPLASTY RIGHT;  Surgeon: Hessie Knows, MD;  Location: [facility] ORS;  Service: Orthopedics;  Laterality: Right;   Social History   Socioeconomic History  . Marital status: Married    Spouse name: Not on file  . Number of children: Not on file  . Years of education: Not on file  . Highest education level: Not on file  Occupational History  . Not on file  Social Needs  . Financial resource strain: Not on file  . Food insecurity    Worry: Not on file    Inability: Not on file  . Transportation needs    Medical: Not on file    Non-medical: Not on file  Tobacco Use  . Smoking status: Former Smoker    Quit date: 10/03/1972     Years since quitting: 46.7  . Smokeless tobacco: Never Used  Substance and Sexual Activity  . Alcohol use: No    Alcohol/week: 0.0 standard drinks    Frequency: Never  . Drug use: No  . Sexual activity: Not on file  Lifestyle  . Physical activity    Days per week: Not on file    Minutes per session: Not on file  . Stress: Not on file  Relationships  . Social Herbalist on phone: Not on file    Gets together: Not on file    Attends religious service: Not on file    Active member of club or organization: Not on file    Attends meetings of clubs or organizations: Not on file    Relationship status: Not on file  . Intimate partner violence    Fear of current or ex partner: Not on file    Emotionally abused: Not on file    Physically abused: Not on file    Forced sexual activity: Not on file  Other Topics Concern  . Not on file  Social History Narrative  . Not on file   Review of Systems  All other systems reviewed and are negative.      Objective:   Physical Exam Vitals signs and nursing note reviewed.  Constitutional:      Appearance: Normal appearance. He is obese.  HENT:     Head: Normocephalic and atraumatic.     Jaw: There is normal jaw occlusion.     Right Ear: Tympanic membrane, ear canal and external ear normal.     Left Ear: Tympanic membrane, ear canal and external ear normal.     Nose: Nose normal.     Mouth/Throat:     Mouth: Mucous membranes are moist.     Pharynx: Oropharynx is clear.  Eyes:     General: Lids are normal. No scleral icterus.       Right eye: No discharge.        Left eye: No discharge.     Extraocular Movements: Extraocular movements intact.     Conjunctiva/sclera: Conjunctivae normal.     Pupils: Pupils are equal, round, and reactive to light.  Neck:     Musculoskeletal: Normal range of motion.     Thyroid: No thyroid mass, thyromegaly or thyroid tenderness.     Vascular: No carotid bruit or JVD.     Trachea: Trachea  normal.  Cardiovascular:     Rate and Rhythm: Normal rate and regular rhythm.     Heart sounds: Normal heart  sounds.  Pulmonary:     Effort: Pulmonary effort is normal. No respiratory distress.     Breath sounds: Normal breath sounds and air entry. No wheezing.  Abdominal:     General: Abdomen is protuberant. Bowel sounds are normal.     Palpations: Abdomen is soft.     Tenderness: There is no abdominal tenderness. There is no right CVA tenderness or left CVA tenderness.     Hernia: There is no hernia in the umbilical area.  Musculoskeletal: Normal range of motion.     Right lower leg: No edema.     Left lower leg: No edema.  Lymphadenopathy:     Head:     Right side of head: No submental, submandibular, tonsillar, preauricular, posterior auricular or occipital adenopathy.     Left side of head: No submental, submandibular, tonsillar, preauricular, posterior auricular or occipital adenopathy.     Cervical: No cervical adenopathy.     Right cervical: No superficial, deep or posterior cervical adenopathy.    Left cervical: No superficial, deep or posterior cervical adenopathy.     Upper Body:     Right upper body: No supraclavicular or epitrochlear adenopathy.     Left upper body: No supraclavicular or epitrochlear adenopathy.  Skin:    General: Skin is warm and dry.     Capillary Refill: Capillary refill takes less than 2 seconds.     Nails: There is no clubbing.   Neurological:     General: No focal deficit present.     Mental Status: He is alert and oriented to person, place, and time.     GCS: GCS eye subscore is 4. GCS verbal subscore is 5. GCS motor subscore is 6.     Cranial Nerves: Cranial nerves are intact.     Sensory: Sensation is intact.     Motor: Motor function is intact.     Coordination: Coordination is intact. Romberg sign negative.  Psychiatric:        Attention and Perception: Attention and perception normal.        Mood and Affect: Mood normal.         Speech: Speech normal.        Behavior: Behavior normal. Behavior is cooperative.        Thought Content: Thought content normal.        Cognition and Memory: Cognition and memory normal.        Judgment: Judgment normal.           Assessment & Plan:  Biometric Screeing Orders Placed This Encounter  Procedures  . Glucose, random  . Lipid Panel With LDL/HDL Ratio  . VITAMIN D 25 Hydroxy (Vit-D Deficiency, Fractures)  RTC PRN Patient verbalizes understanding and has no questions at discharge.

## 2019-06-18 LAB — LIPID PANEL WITH LDL/HDL RATIO
Cholesterol, Total: 152 mg/dL (ref 100–199)
HDL: 33 mg/dL — ABNORMAL LOW (ref >39)
LDL Chol Calc (NIH): 97 mg/dL (ref 0–99)
LDL/HDL Ratio: 2.9 ratio (ref 0.0–3.6)
Triglycerides: 122 mg/dL (ref 0–149)
VLDL Cholesterol Cal: 22 mg/dL (ref 5–40)

## 2019-06-18 LAB — VITAMIN D 25 HYDROXY (VIT D DEFICIENCY, FRACTURES): Vit D, 25-Hydroxy: 42.4 ng/mL (ref 30.0–100.0)

## 2019-06-18 LAB — GLUCOSE, RANDOM: Glucose: 95 mg/dL (ref 65–99)

## 2019-06-22 ENCOUNTER — Encounter: Payer: Self-pay | Admitting: Adult Health

## 2019-06-22 ENCOUNTER — Ambulatory Visit (INDEPENDENT_AMBULATORY_CARE_PROVIDER_SITE_OTHER): Payer: Managed Care, Other (non HMO) | Admitting: Vascular Surgery

## 2019-06-22 ENCOUNTER — Encounter (INDEPENDENT_AMBULATORY_CARE_PROVIDER_SITE_OTHER): Payer: Self-pay | Admitting: Vascular Surgery

## 2019-06-22 ENCOUNTER — Other Ambulatory Visit: Payer: Self-pay

## 2019-06-22 VITALS — BP 160/84 | HR 79 | Resp 12 | Ht 69.0 in | Wt 222.0 lb

## 2019-06-22 DIAGNOSIS — E785 Hyperlipidemia, unspecified: Secondary | ICD-10-CM | POA: Diagnosis not present

## 2019-06-22 DIAGNOSIS — I10 Essential (primary) hypertension: Secondary | ICD-10-CM | POA: Diagnosis not present

## 2019-06-22 DIAGNOSIS — Z95828 Presence of other vascular implants and grafts: Secondary | ICD-10-CM | POA: Diagnosis not present

## 2019-06-22 DIAGNOSIS — M1712 Unilateral primary osteoarthritis, left knee: Secondary | ICD-10-CM | POA: Diagnosis not present

## 2019-06-22 DIAGNOSIS — I2699 Other pulmonary embolism without acute cor pulmonale: Secondary | ICD-10-CM

## 2019-06-22 NOTE — Progress Notes (Signed)
MRN : [id number]  Johnny Freeman is a 68 y.o. (Jun 28, 1951) male who presents with chief complaint of  Chief Complaint  Patient presents with  . Follow-up  .  History of Present Illness: Patient returns today in follow up after removal of his IVC filter last month.  He had this placed around the time of his knee replacement and his filter was removed when he had no postoperative DVT.  His legs feel great.  He says he feels like he has to new legs.  He had no periprocedural complications around the time of either his knee replacement or his filter insertion or subsequent retrieval.  He has no complaints today.  He did want to discuss what surgeries would be considered high risk should he ever need another filter in the future.  Current Outpatient Medications  Medication Sig Dispense Refill  . acetaminophen (TYLENOL) 500 MG tablet Take 500 mg by mouth 2 (two) times daily as needed (FOR PAIN.).    Marland Kitchen aspirin EC 81 MG tablet Take 81 mg by mouth daily. Takes 2 daily    . benazepril (LOTENSIN) 10 MG tablet Take 10 mg by mouth every morning.     . Cholecalciferol (D2000 ULTRA STRENGTH) 50 MCG (2000 UT) CAPS Take by mouth.    . diltiazem (CARDIZEM CD) 180 MG 24 hr capsule Take 180 mg by mouth every morning.     . docusate sodium (COLACE) 100 MG capsule Take 100 mg by mouth every other day.     . Multiple Vitamin (MULTI-VITAMIN) tablet Take by mouth.    Marland Kitchen omeprazole (PRILOSEC) 20 MG capsule TAKE 1 CAPSULE EVERY DAY    . Probiotic Product (PROBIOTIC-10 PO) Take 1 tablet by mouth daily.    Marland Kitchen triamcinolone cream (KENALOG) 0.1 % Apply topically.    Marland Kitchen atorvastatin (LIPITOR) 40 MG tablet      No current facility-administered medications for this visit.     Past Medical History:  Diagnosis Date  . Anxiety   . Cancer [facility])    prostate 2011  . Complication of anesthesia   . Dyspnea   . GERD (gastroesophageal reflux disease)   . Headache    migraines  . Hypertension   . PE (pulmonary  thromboembolism) ([place])   . PONV (postoperative nausea and vomiting)    nauseated after prostate bx HIFU  . Prostate cancer [facility])     Past Surgical History:  Procedure Laterality Date  . CYSTOSCOPY N/A 11/04/2017   Procedure: CYSTOSCOPY;  Surgeon: Hessie Knows, MD;  Location: [facility] ORS;  Service: Orthopedics;  Laterality: N/A;  . IVC FILTER INSERTION N/A 02/23/2019   Procedure: IVC FILTER INSERTION;  Surgeon: Algernon Huxley, MD;  Location: [place] [facility];  Service: Cardiovascular;  Laterality: N/A;  . IVC FILTER REMOVAL N/A 05/10/2019   Procedure: IVC FILTER REMOVAL;  Surgeon: Algernon Huxley, MD;  Location: [place] [facility];  Service: Cardiovascular;  Laterality: N/A;  . PROSTATE SURGERY    . ROTATOR CUFF REPAIR Right 2003  . TOTAL KNEE ARTHROPLASTY Left 11/04/2017   Procedure: TOTAL KNEE ARTHROPLASTY;  Surgeon: Hessie Knows, MD;  Location: [facility] ORS;  Service: Orthopedics;  Laterality: Left;  . TOTAL KNEE ARTHROPLASTY Right 03/02/2019   Procedure: TOTAL KNEE ARTHROPLASTY RIGHT;  Surgeon: Hessie Knows, MD;  Location: [facility] ORS;  Service: Orthopedics;  Laterality: Right;    Social History Social History   Tobacco Use  . Smoking status: Former Smoker    Quit date: 10/03/1972  Years since quitting: 46.7  . Smokeless tobacco: Never Used  Substance Use Topics  . Alcohol use: No    Alcohol/week: 0.0 standard drinks    Frequency: Never  . Drug use: No     Family History Family History  Problem Relation Age of Onset  . Heart attack Father      Allergies  Allergen Reactions  . Shellfish Allergy Swelling    Swelling in eyes    REVIEW OF SYSTEMS(Negative unless checked)  Constitutional: [] ??Weight loss[] ??Fever[] ??Chills Cardiac:[] ??Chest pain[] ??Chest pressure[] ??Palpitations [] ??Shortness of breath when laying flat [] ??Shortness of breath at rest [] ??Shortness of breath with exertion. Vascular: [x] ??Pain in legs with walking[x] ??Pain in legsat  rest[] ??Pain in legs when laying flat [] ??Claudication [] ??Pain in feet when walking [] ??Pain in feet at rest [] ??Pain in feet when laying flat [x] ??History of DVT [] ??Phlebitis [] ??Swelling in legs [] ??Varicose veins [] ??Non-healing ulcers Pulmonary: [] ??Uses home oxygen [] ??Productive cough[] ??Hemoptysis [] ??Wheeze [] ??COPD [] ??Asthma Neurologic: [] ??Dizziness [] ??Blackouts [] ??Seizures [] ??History of stroke [] ??History of TIA[] ??Aphasia [] ??Temporary blindness[] ??Dysphagia [] ??Weaknessor numbness in arms [] ??Weakness or numbnessin legs Musculoskeletal: [x] ??Arthritis [] ??Joint swelling [x] ??Joint pain [] ??Low back pain Hematologic:[] ??Easy bruising[] ??Easy bleeding [] ??Hypercoagulable state [] ??Anemic [] ??Hepatitis Gastrointestinal:[] ??Blood in stool[] ??Vomiting blood[] ??Gastroesophageal reflux/heartburn[] ??Abdominal pain Genitourinary: [] ??Chronic kidney disease [] ??Difficulturination [] ??Frequenturination [] ??Burning with urination[] ??Hematuria Skin: [] ??Rashes [] ??Ulcers [] ??Wounds Psychological: [] ??History of anxiety[] ??History of major depression.   Physical Examination  BP (!) 160/84 (BP Location: Left Arm, Patient Position: Sitting, Cuff Size: Normal)   Pulse 79   Resp 12   Ht 5\' 9"  (1.753 m)   Wt 222 lb (100.7 kg)   BMI 32.78 kg/m  Gen:  WD/WN, NAD Head: [place]/AT, No temporalis wasting. Ear/Nose/Throat: Hearing grossly intact, nares w/o erythema or drainage Eyes: Conjunctiva clear. Sclera non-icteric Neck: Supple.  Trachea midline Pulmonary:  Good air movement, no use of accessory muscles.  Cardiac: RRR, no JVD Vascular:  Vessel Right Left  Radial Palpable Palpable                       Musculoskeletal: M/S 5/5 throughout.  No deformity or atrophy. No significant LE edema. Neurologic: Sensation grossly intact in extremities.  Symmetrical.  Speech is fluent.  Psychiatric:  Judgment intact, Mood & affect appropriate for pt's clinical situation. Dermatologic: No rashes or ulcers noted. Right jugular access site is well healed       Labs Recent Results (from the past 2160 hour(s))  SARS CORONAVIRUS 2 Nasal Swab Aptima Multi Swab     Status: None   Collection Time: 05/07/19  1:22 PM   Specimen: Aptima Multi Swab; Nasal Swab  Result Value Ref Range   SARS Coronavirus 2 NEGATIVE NEGATIVE    Comment: (NOTE) SARS-CoV-2 target nucleic acids are NOT DETECTED. The SARS-CoV-2 RNA is generally detectable in upper and lower respiratory specimens during the acute phase of infection. Negative results do not preclude SARS-CoV-2 infection, do not rule out co-infections with other pathogens, and should not be used as the sole basis for treatment or other patient management decisions. Negative results must be combined with clinical observations, patient history, and epidemiological information. The expected result is Negative. Fact Sheet for Patients: SugarRoll.be Fact Sheet for Healthcare Providers: https://www.woods-mathews.com/ This test is not yet approved or cleared by the [place] FDA and  has been authorized for detection and/or diagnosis of SARS-CoV-2 by FDA under an Emergency Use Authorization (EUA). This EUA will remain  in effect (meaning this test can be used) for the duration of the COVID-19 declaration under Section 56 4(b)(1) of the Act, 21 U.S.C. section  360bbb-3(b)(1), unless the authorization is terminated or revoked sooner. Performed at [facility], [place] [address], [place], [place] [address]   Glucose, random     Status: None   Collection Time: 06/17/19  8:45 AM  Result Value Ref Range   Glucose 95 65 - 99 mg/dL  Lipid Panel With LDL/HDL Ratio     Status: Abnormal   Collection Time: 06/17/19  8:45 AM  Result Value Ref Range   Cholesterol, Total 152 100 - 199 mg/dL   Triglycerides 122 0 -  149 mg/dL   HDL 33 (L) >39 mg/dL   VLDL Cholesterol Cal 22 5 - 40 mg/dL   LDL Chol Calc (NIH) 97 0 - 99 mg/dL   LDL/HDL Ratio 2.9 0.0 - 3.6 ratio    Comment:                                     LDL/HDL Ratio                                             Men  Women                               1/2 Avg.Risk  1.0    1.5                                   Avg.Risk  3.6    3.2                                2X Avg.Risk  6.2    5.0                                3X Avg.Risk  8.0    6.1   VITAMIN D 25 Hydroxy (Vit-D Deficiency, Fractures)     Status: None   Collection Time: 06/17/19  8:45 AM  Result Value Ref Range   Vit D, 25-Hydroxy 42.4 30.0 - 100.0 ng/mL    Comment: Vitamin D deficiency has been defined by the [place] practice guideline as a level of serum 25-OH vitamin D less than 20 ng/mL (1,2). The Endocrine Society went on to further define vitamin D insufficiency as a level between 21 and 29 ng/mL (2). 1. IOM (Institute of Medicine). 2010. Dietary reference    intakes for calcium and D. [place]: The    [employer]. 2. Holick MF, Binkley [place], Bischoff-Ferrari HA, et al.    Evaluation, treatment, and prevention of vitamin D    deficiency: an Endocrine Society clinical practice    guideline. JCEM. 2011 Jul; 96(7):1911-30.     Radiology No results found.  Assessment/Plan Hypertension blood pressure control important in reducing the progression of atherosclerotic disease. On appropriate oral medications.   Hyperlipidemia lipid control important in reducing the progression of atherosclerotic disease. Continue statin therapy   Arthritis of right knee About 15 weeks status post right total knee replacement and has done well.  No perioperative thromboembolic complications.  Filter has now been removed.  Pulmonary embolus (HCC) Previous.  No new symptoms with IVC filter placed around the time of his knee surgery.   Filter is now been removed without complications.  Patient to return to clinic as needed.     Leotis Pain, MD  06/22/2019 3:43 PM    This note was created with Dragon medical transcription system.  Any errors from dictation are purely unintentional

## 2019-10-08 ENCOUNTER — Ambulatory Visit: Payer: Managed Care, Other (non HMO) | Attending: Rheumatology | Admitting: Occupational Therapy

## 2019-10-08 ENCOUNTER — Encounter: Payer: Self-pay | Admitting: Occupational Therapy

## 2019-10-08 ENCOUNTER — Other Ambulatory Visit: Payer: Self-pay

## 2019-10-08 DIAGNOSIS — M25641 Stiffness of right hand, not elsewhere classified: Secondary | ICD-10-CM | POA: Diagnosis not present

## 2019-10-08 DIAGNOSIS — M79642 Pain in left hand: Secondary | ICD-10-CM | POA: Diagnosis present

## 2019-10-08 DIAGNOSIS — M79641 Pain in right hand: Secondary | ICD-10-CM

## 2019-10-08 DIAGNOSIS — M25642 Stiffness of left hand, not elsewhere classified: Secondary | ICD-10-CM | POA: Diagnosis present

## 2019-10-08 DIAGNOSIS — M6281 Muscle weakness (generalized): Secondary | ICD-10-CM

## 2019-10-08 NOTE — Patient Instructions (Signed)
Recommend paraffin unit use at home AM and PM  MC and Carpal spreads by  Wife about 10 reps Tendon glides AROM - pain free - stop when feeling pull  composite fist to 2 cm foam block -  Tapping of digits  Review and hand out provided on joint protection - respect pain , modify act  avoid tight and sustained grips Use larger joints  Use compression gloves at needed Loosen up grips on tools

## 2019-10-08 NOTE — Therapy (Signed)
[facility] [address] [place], [address] Phone: [phone]   Fax:  [phone]  Occupational Therapy Evaluation  Patient Details  Name: Johnny Freeman MRN: [id number] Date of Birth: [birth year] Referring Provider (OT): Jefm Bryant   Encounter Date: 10/08/2019  OT End of Session - 10/08/19 1602    Visit Number  1    Number of Visits  4    Date for OT Re-Evaluation  11/05/19    OT Start Time  1102    OT Stop Time  1200    OT Time Calculation (min)  58 min    Activity Tolerance  Patient tolerated treatment well    Behavior During Therapy  [facility] for tasks assessed/performed       Past Medical History:  Diagnosis Date  . Anxiety   . Cancer [facility])    prostate 2011  . Complication of anesthesia   . Dyspnea   . GERD (gastroesophageal reflux disease)   . Headache    migraines  . Hypertension   . PE (pulmonary thromboembolism) ([place])   . PONV (postoperative nausea and vomiting)    nauseated after prostate bx HIFU  . Prostate cancer [facility])     Past Surgical History:  Procedure Laterality Date  . CYSTOSCOPY N/A 11/04/2017   Procedure: CYSTOSCOPY;  Surgeon: Hessie Knows, MD;  Location: [facility] ORS;  Service: Orthopedics;  Laterality: N/A;  . IVC FILTER INSERTION N/A 02/23/2019   Procedure: IVC FILTER INSERTION;  Surgeon: Algernon Huxley, MD;  Location: [place] [facility];  Service: Cardiovascular;  Laterality: N/A;  . IVC FILTER REMOVAL N/A 05/10/2019   Procedure: IVC FILTER REMOVAL;  Surgeon: Algernon Huxley, MD;  Location: [place] [facility];  Service: Cardiovascular;  Laterality: N/A;  . PROSTATE SURGERY    . ROTATOR CUFF REPAIR Right 2003  . TOTAL KNEE ARTHROPLASTY Left 11/04/2017   Procedure: TOTAL KNEE ARTHROPLASTY;  Surgeon: Hessie Knows, MD;  Location: [facility] ORS;  Service: Orthopedics;  Laterality: Left;  . TOTAL KNEE ARTHROPLASTY Right 03/02/2019   Procedure: TOTAL KNEE ARTHROPLASTY RIGHT;  Surgeon: Hessie Knows, MD;  Location: [facility] ORS;  Service: Orthopedics;  Laterality: Right;    There were no vitals filed for this visit.  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        [facility] OT Assessment - 10/08/19 0001      Assessment   Medical Diagnosis  OA with bilateral hand pain     Referring Provider (OT)  Jefm Bryant    Onset Date/Surgical Date  03/31/19    Hand Dominance  Right      Precautions   Required Braces or Orthoses  --   Has thumb splints but not wearing      Home  Environment   Lives With  Spouse      Prior Function   Vocation  Full time employment   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    Leisure  Built model trains ,and yard work       Pharmacist, community Grip (lbs)  46    Right Hand Lateral Pinch  15 lbs    Right Hand 3 Point Pinch  12 lbs    Left Hand Grip (lbs)  32    Left Hand Lateral Pinch  15 lbs    Left Hand 3 Point Pinch  15 lbs      Right Hand AROM   R Index  MCP 0-90  60 Degrees    R Index PIP 0-100  100 Degrees    R Long  MCP 0-90  65 Degrees    R Long PIP 0-100  100  Degrees    R Ring  MCP 0-90  80 Degrees    R Ring PIP 0-100  100 Degrees    R Little  MCP 0-90  90 Degrees    R Little PIP 0-100  90 Degrees      Left Hand AROM   L Index  MCP 0-90  65 Degrees    L Index PIP 0-100  100 Degrees    L Long  MCP 0-90  65 Degrees    L Long PIP 0-100  100 Degrees    L Ring  MCP 0-90  80 Degrees    L Ring PIP 0-100  100 Degrees    L Little  MCP 0-90  90 Degrees    L Little PIP 0-100  90 Degrees               OT Treatments/Exercises (OP) - 10/08/19 0001      RUE Paraffin   Number Minutes Paraffin  8 Minutes    RUE Paraffin Location  Hand    Comments  prior to AROM to decrease stiffness and pain       LUE Paraffin   Number Minutes Paraffin  8 Minutes    LUE Paraffin Location  Hand    Comments  prior to AROM to decrease sttiffness and pain       HEP review - pt report hands felt less stiff and no pain  Showed increase MC flexion   Hand out provided and review:   Recommend paraffin unit use at home AM and PM  MC and Carpal spreads by  Wife about 10 reps Tendon glides AROM - pain free - stop when feeling pull  composite fist to 2 cm foam block -  Tapping of digits  Review and hand out provided on joint protection - respect pain , modify act  avoid tight and sustained grips Use larger joints  Use compression gloves at needed Loosen up grips on tools        OT Education - 10/08/19 1602    Education Details  findings of eval  and HEP , joint protection    Person(s) Educated  Patient    Methods  Explanation;Demonstration;Tactile cues;Verbal cues;Handout    Comprehension  Verbal cues required;Returned demonstration;Verbalized understanding       OT Short Term Goals - 10/08/19 1610      OT SHORT TERM GOAL #1   Title  pain on PRHWE decrease by 20 points    Baseline  pain on PRWHE at eval 32/50    Time  3    Period  Weeks    Status  New    Target Date  10/29/19        OT Long Term Goals - 10/08/19 1610      OT LONG TERM  GOAL #1   Title  Pt to be independent in HEP increase  MC's flexion AROM with decrease pain at the worse less than 5/10    Baseline  pain at the worse 8-10 at the worse , and MC's60-80 - 2nd thru 4th digits    Time  4    Period  Weeks    Status  New    Target Date  11/05/19      OT LONG TERM GOAL #2   Title  Pt to verbalize using 2-3 modiifications and joint protection to decrease pain and increase ease performing his job    Baseline  no knowledge    Time  4    Period  Weeks    Status  New    Target Date  11/05/19      OT LONG TERM GOAL #3   Title  Grip strenght increase with at least 8 lbs in bilateral hands without increase symptoms    Baseline  pain with grip strength R 46lbs ,L 32 lbs    Time  4    Period  Weeks    Status  New    Target Date  11/05/19            Plan - 10/08/19 1603    Clinical Impression Statement  Pt present at OT eval with diagnosis of OA with hand pain - R hand worse than L - pt pain mostly in 2nd and 3rd MC 's , and 3rd and thru 5th PIP's 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 , pt AROM in MC's decrease by 10-30 degrees, and grip /prehension strength greatly decrease , pt tender over A1pulley - with L worse than R , - all limiting his functional use of hands in ADL's and IADL's    OT Occupational Profile and History  Problem Focused Assessment - Including review of records relating to presenting problem    Occupational performance deficits (Please refer to evaluation for details):  ADL's;IADL's;Work    Body Structure / Function / Physical Skills  ADL;IADL;Flexibility;ROM;UE functional use;Decreased knowledge of use of DME;Pain;Strength    Rehab Potential  Fair    Clinical Decision Making  Limited treatment options, no task modification necessary    Comorbidities Affecting Occupational Performance:  May have comorbidities impacting occupational performance   chronic condition OA   Modification or Assistance to Complete Evaluation   No  modification of tasks or assist necessary to complete eval    OT Frequency  1x / week    OT Duration  4 weeks    OT Treatment/Interventions  Self-care/ADL training;Therapeutic exercise;Paraffin;Splinting;Patient/family education;DME and/or AE instruction;Manual Therapy    Plan  assess prpgress with homeprogram - modify as needed    OT Home Exercise Plan  see pt instruction  Consulted and Agree with Plan of Care  Patient       Patient will benefit from skilled therapeutic intervention in order to improve the following deficits and impairments:   Body Structure / Function / Physical Skills: ADL, IADL, Flexibility, ROM, UE functional use, Decreased knowledge of use of DME, Pain, Strength       Visit Diagnosis: Stiffness of right hand, not elsewhere classified - Plan: Ot plan of care cert/re-cert  Stiffness of left hand, not elsewhere classified - Plan: Ot plan of care cert/re-cert  Pain in right hand - Plan: Ot plan of care cert/re-cert  Pain in left hand - Plan: Ot plan of care cert/re-cert  Muscle weakness (generalized) - Plan: Ot plan of care cert/re-cert    Problem List Patient Active Problem List   Diagnosis Date Noted  . S/P IVC filter 04/27/2019  . Status post total knee replacement using cement, right 03/02/2019  . Arthritis of right knee 12/08/2018  . Anemia 12/07/2017  . Other pulmonary embolism without acute cor pulmonale ([place]) 12/01/2017  . History of prostate cancer 12/01/2017  . Primary localized osteoarthritis of left knee 11/04/2017  . Gastroesophageal reflux disease without esophagitis 07/31/2017  . Obesity 04/17/2017  . CAD (coronary artery disease) 12/26/2014  . Hyperlipidemia 12/26/2014  . Hypertension 12/26/2014    Rosalyn Gess OTR/L,CLT 10/08/2019, 4:19 PM  [facility] [address] [place], [address] Phone: [phone]   Fax:  [phone]  Name: Johnny Freeman MRN:  [id number] Date of Birth: [birth year]

## 2019-10-21 ENCOUNTER — Ambulatory Visit: Payer: Managed Care, Other (non HMO) | Admitting: Occupational Therapy

## 2019-10-21 ENCOUNTER — Other Ambulatory Visit: Payer: Self-pay

## 2019-10-21 DIAGNOSIS — M25641 Stiffness of right hand, not elsewhere classified: Secondary | ICD-10-CM

## 2019-10-21 DIAGNOSIS — M79641 Pain in right hand: Secondary | ICD-10-CM

## 2019-10-21 DIAGNOSIS — M79642 Pain in left hand: Secondary | ICD-10-CM

## 2019-10-21 DIAGNOSIS — M25642 Stiffness of left hand, not elsewhere classified: Secondary | ICD-10-CM

## 2019-10-21 DIAGNOSIS — M6281 Muscle weakness (generalized): Secondary | ICD-10-CM

## 2019-10-21 NOTE — Therapy (Signed)
[facility] PHYSICAL AND SPORTS MEDICINE [address], [place], [address] Phone: [phone]   Fax:  [phone]  Occupational Therapy Treatment  Patient Details  Name: Johnny Freeman MRN: [id number] Date of Birth: 1951/05/14 Referring Provider (OT): Jefm Bryant   Encounter Date: 10/21/2019  OT End of Session - 10/21/19 0854    Visit Number  2    Number of Visits  4    Date for OT Re-Evaluation  11/05/19    OT Start Time  0817    OT Stop Time  0851    OT Time Calculation (min)  34 min    Activity Tolerance  Patient tolerated treatment well    Behavior During Therapy  [facility] for tasks assessed/performed       Past Medical History:  Diagnosis Date  . Anxiety   . Cancer [facility])    prostate 2011  . Complication of anesthesia   . Dyspnea   . GERD (gastroesophageal reflux disease)   . Headache    migraines  . Hypertension   . PE (pulmonary thromboembolism) ([address])   . PONV (postoperative nausea and vomiting)    nauseated after prostate bx HIFU  . Prostate cancer [facility])     Past Surgical History:  Procedure Laterality Date  . CYSTOSCOPY N/A 11/04/2017   Procedure: CYSTOSCOPY;  Surgeon: Hessie Knows, MD;  Location: [facility] ORS;  Service: Orthopedics;  Laterality: N/A;  . IVC FILTER INSERTION N/A 02/23/2019   Procedure: IVC FILTER INSERTION;  Surgeon: Algernon Huxley, MD;  Location: [place] CV LAB;  Service: Cardiovascular;  Laterality: N/A;  . IVC FILTER REMOVAL N/A 05/10/2019   Procedure: IVC FILTER REMOVAL;  Surgeon: Algernon Huxley, MD;  Location: [place] CV LAB;  Service: Cardiovascular;  Laterality: N/A;  . PROSTATE SURGERY    . ROTATOR CUFF REPAIR Right 2003  . TOTAL KNEE ARTHROPLASTY Left 11/04/2017   Procedure: TOTAL KNEE ARTHROPLASTY;  Surgeon: Hessie Knows, MD;  Location: [facility] ORS;  Service: Orthopedics;  Laterality: Left;  . TOTAL KNEE ARTHROPLASTY Right 03/02/2019   Procedure: TOTAL KNEE ARTHROPLASTY RIGHT;  Surgeon: Hessie Knows, MD;  Location: [facility] ORS;  Service: Orthopedics;  Laterality: Right;    There were no vitals filed for this visit.  Subjective Assessment - 10/21/19 0836    Subjective   Doing okay - using paraffin in the evening and that helps for the pain - but not doing it in the morning - depending on what I do the day - some days worse than others - but I am trying to modify how I use my hands    Pertinent History  Pt has OA and is having pain in bilateral hands for while - during day depending on what he does , and afterwards - pain can increase to 8-10/10 in 2nd and 3rd MC - and 3rd and 4th PIP's    Patient Stated Goals  I want the pain to be better so I can use my hands as long as I can - not planning to retire yet- and 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  Yes    Pain Score  2    8 at times at work when using hands a lot   Pain Location  Hand    Pain Orientation  Right;Left    Pain Descriptors / Indicators  Aching    Pain Type  Chronic pain    Pain Onset  More than  a month ago    Aggravating Factors   gripping tools , sing repetitive         [facility] OT Assessment - 10/21/19 0001      Strength   Right Hand Grip (lbs)  48    Right Hand Lateral Pinch  18 lbs    Right Hand 3 Point Pinch  12 lbs    Left Hand Grip (lbs)  32    Left Hand Lateral Pinch  19 lbs    Left Hand 3 Point Pinch  14 lbs      Right Hand AROM   R Index  MCP 0-90  65 Degrees    R Long  MCP 0-90  65 Degrees    R Ring  MCP 0-90  75 Degrees    R Little  MCP 0-90  90 Degrees      Left Hand AROM   L Index  MCP 0-90  65 Degrees    L Long  MCP 0-90  70 Degrees    L Ring  MCP 0-90  80 Degrees    L Little  MCP 0-90  90 Degrees         AROM about the same- but increase after using paraffin - reinforce for pt to use his paraffin in the am to decrease stiffness and increase motion prior to going to work - had less pain with gripping  And evening to calm things down - decrease pain - he is using it  in evening       Pt had few open areas on R hand - only did paraffin to digits to MC's - not open areas on paraffin -   OT Treatments/Exercises (OP) - 10/21/19 0001      RUE Paraffin   Number Minutes Paraffin  8 Minutes    RUE Paraffin Location  Hand    Comments  prior to soft tissue and ROM      LUE Paraffin   Number Minutes Paraffin  8 Minutes    LUE Paraffin Location  Hand    Comments  prior to soft tissue and ROM       soft tissue mobs done - [facility] and CT spreads -and webspace  Done some gentle joint mobs to MC's and traction to digits- wife to help with this - add last 2   Tendon glides - focus on MC flexion - intrinsic fist looks great  And then composite  Cont Opposition    And joint protection -again review with pt during  paraffin Using larger joints and avoid tight and prolonged grip- building handles up  Pt report he started doing that at work        OT Education - 10/21/19 0854    Education Details  HEP and paraffin use    Person(s) Educated  Patient    Methods  Explanation;Demonstration;Tactile cues;Verbal cues;Handout    Comprehension  Verbal cues required;Returned demonstration;Verbalized understanding       OT Short Term Goals - 10/08/19 1610      OT SHORT TERM GOAL #1   Title  pain on PRHWE decrease by 20 points    Baseline  pain on PRWHE at eval 32/50    Time  3    Period  Weeks    Status  New    Target Date  10/29/19        OT Long Term Goals - 10/08/19 1610      OT LONG TERM GOAL #1  Title  Pt to be independent in HEP increase  MC's flexion AROM with decrease pain at the worse less than 5/10    Baseline  pain at the worse 8-10 at the worse , and MC's60-80 - 2nd thru 4th digits    Time  4    Period  Weeks    Status  New    Target Date  11/05/19      OT LONG TERM GOAL #2   Title  Pt to verbalize using 2-3 modiifications and joint protection to decrease pain and increase ease performing his job    Baseline  no knowledge    Time  4     Period  Weeks    Status  New    Target Date  11/05/19      OT LONG TERM GOAL #3   Title  Grip strenght increase with at least 8 lbs in bilateral hands without increase symptoms    Baseline  pain with grip strength R 46lbs ,L 32 lbs    Time  4    Period  Weeks    Status  New    Target Date  11/05/19            Plan - 10/21/19 0854    Clinical Impression Statement  Pt has diagnosis of OA with hand pain - R worse than L - decrease MC flexion worse at 2nd and 3rd - pt report decrease pain using paraffin end of day and 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 - reinforce for pt to use paraffin in the am to increase ROM decrease stiffness prior to going to work - pt tender over A1pulleys of 2nd thru 4th on L and 2nd and 3rd on R - show increase MC flexion after paraffin    OT Occupational Profile and History  Problem Focused Assessment - Including review of records relating to presenting problem    Occupational performance deficits (Please refer to evaluation for details):  ADL's;IADL's;Work    Body Structure / Function / Physical Skills  ADL;IADL;Flexibility;ROM;UE functional use;Decreased knowledge of use of DME;Pain;Strength    Rehab Potential  Fair    Clinical Decision Making  Limited treatment options, no task modification necessary    Comorbidities Affecting Occupational Performance:  May have comorbidities impacting occupational performance    Modification or Assistance to Complete Evaluation   No modification of tasks or assist necessary to complete eval    OT Frequency  --   3 wks   OT Duration  --   3wks   OT Treatment/Interventions  Self-care/ADL training;Therapeutic exercise;Paraffin;Splinting;Patient/family education;DME and/or AE instruction;Manual Therapy    Plan  assess progress with homeprogram - modify as needed    OT Home Exercise Plan  see pt instruction    Consulted and Agree with Plan of Care  Patient       Patient will benefit from skilled therapeutic  intervention in order to improve the following deficits and impairments:   Body Structure / Function / Physical Skills: ADL, IADL, Flexibility, ROM, UE functional use, Decreased knowledge of use of DME, Pain, Strength       Visit Diagnosis: Stiffness of right hand, not elsewhere classified  Stiffness of left hand, not elsewhere classified  Pain in right hand  Pain in left hand  Muscle weakness (generalized)    Problem List Patient Active Problem List   Diagnosis Date Noted  . S/P IVC filter 04/27/2019  . Status post total knee replacement using cement, right 03/02/2019  .  Arthritis of right knee 12/08/2018  . Anemia 12/07/2017  . Other pulmonary embolism without acute cor pulmonale ([place]) 12/01/2017  . History of prostate cancer 12/01/2017  . Primary localized osteoarthritis of left knee 11/04/2017  . Gastroesophageal reflux disease without esophagitis 07/31/2017  . Obesity 04/17/2017  . CAD (coronary artery disease) 12/26/2014  . Hyperlipidemia 12/26/2014  . Hypertension 12/26/2014    Rosalyn Gess OTR/L,CLT 10/21/2019, 8:57 AM  [facility] [address], [place], [address] Phone: [phone]   Fax:  [phone]  Name: MILEZ LAGUEUX MRN: [id number] Date of Birth: 1951-08-13

## 2019-10-21 NOTE — Patient Instructions (Signed)
Same but reinforce to use paraffin in the am for stiffness -tendon glides and then evenings to decrease pain

## 2019-11-11 ENCOUNTER — Encounter: Payer: Managed Care, Other (non HMO) | Admitting: Occupational Therapy

## 2019-11-17 ENCOUNTER — Other Ambulatory Visit: Payer: Self-pay

## 2019-11-17 ENCOUNTER — Other Ambulatory Visit: Payer: Managed Care, Other (non HMO)

## 2019-11-17 DIAGNOSIS — E291 Testicular hypofunction: Secondary | ICD-10-CM

## 2019-11-17 DIAGNOSIS — Z79899 Other long term (current) drug therapy: Secondary | ICD-10-CM

## 2019-11-17 DIAGNOSIS — N138 Other obstructive and reflux uropathy: Secondary | ICD-10-CM

## 2019-11-17 DIAGNOSIS — N401 Enlarged prostate with lower urinary tract symptoms: Secondary | ICD-10-CM

## 2019-11-18 LAB — CBC WITH DIFFERENTIAL/PLATELET
Basophils Absolute: 0 10*3/uL (ref 0.0–0.2)
Basos: 1 %
EOS (ABSOLUTE): 0.1 10*3/uL (ref 0.0–0.4)
Eos: 2 %
Hematocrit: 43.6 % (ref 37.5–51.0)
Hemoglobin: 14.6 g/dL (ref 13.0–17.7)
Immature Grans (Abs): 0 10*3/uL (ref 0.0–0.1)
Immature Granulocytes: 0 %
Lymphocytes Absolute: 1.3 10*3/uL (ref 0.7–3.1)
Lymphs: 22 %
MCH: 29 pg (ref 26.6–33.0)
MCHC: 33.5 g/dL (ref 31.5–35.7)
MCV: 87 fL (ref 79–97)
Monocytes Absolute: 0.8 10*3/uL (ref 0.1–0.9)
Monocytes: 13 %
Neutrophils Absolute: 3.8 10*3/uL (ref 1.4–7.0)
Neutrophils: 62 %
Platelets: 225 10*3/uL (ref 150–450)
RBC: 5.04 x10E6/uL (ref 4.14–5.80)
RDW: 13.6 % (ref 11.6–15.4)
WBC: 6.1 10*3/uL (ref 3.4–10.8)

## 2019-11-18 LAB — PSA: Prostate Specific Ag, Serum: 0.2 ng/mL (ref 0.0–4.0)

## 2019-11-18 LAB — TESTOSTERONE: Testosterone: 421 ng/dL (ref 264–916)

## 2019-12-14 IMAGING — US US EXTREM LOW VENOUS BILAT
1 series · 13 of 24 positions shown · non-contrast
Comparison: None.

CLINICAL DATA: 66-year-old male with acute PE. Evaluate for
residual DVT burden.



[Series 1: us extrem low venous bilat · 0.08mm/px · 13 of 60 slices shown]
[im 1/60]
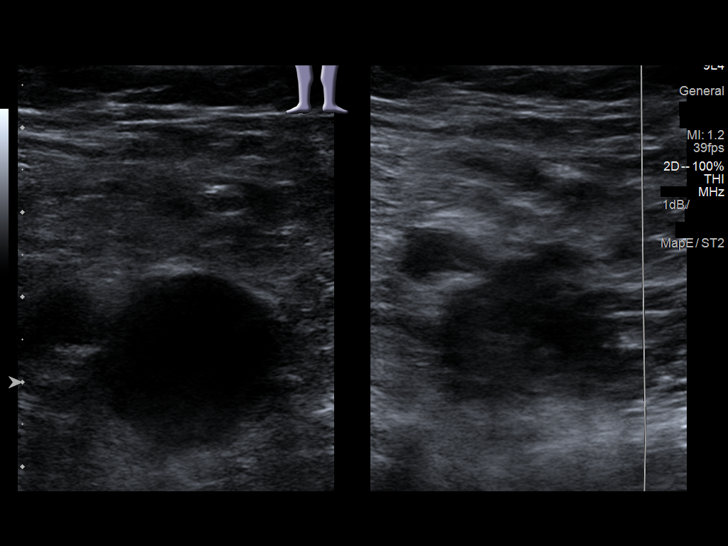
[im 6/60]
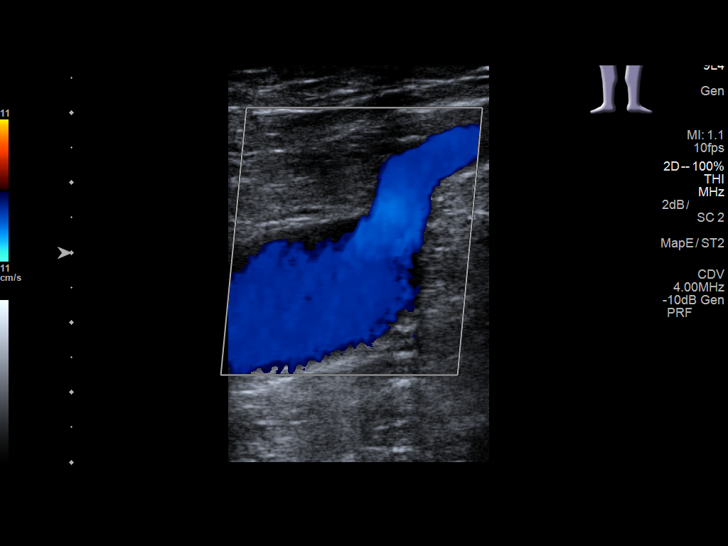
[im 11/60]
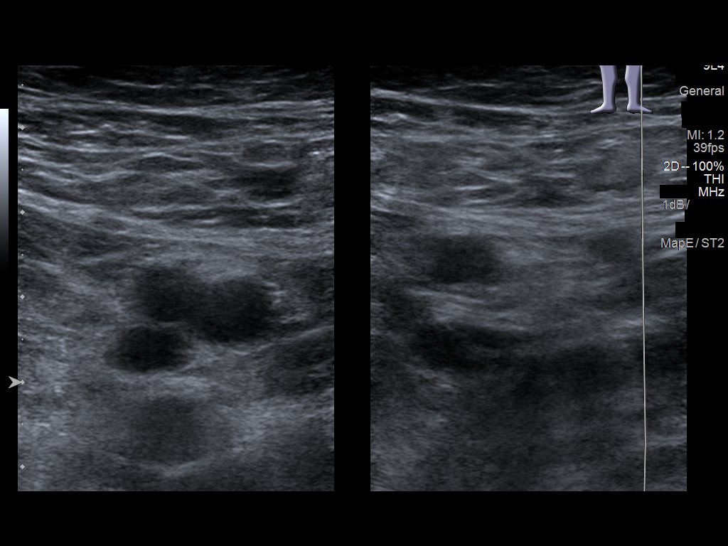
[im 16/60]
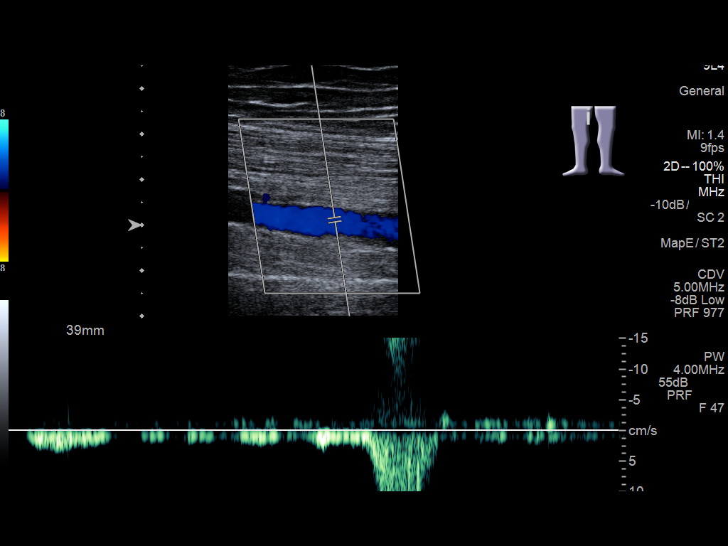
[im 21/60]
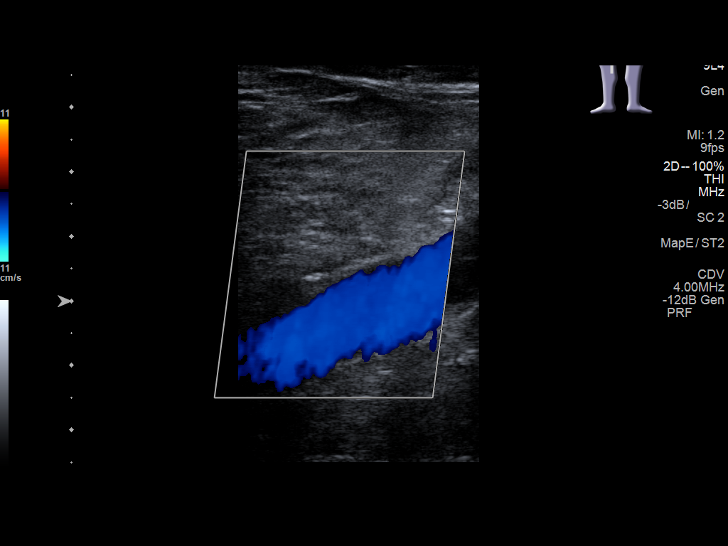
[im 26/60]
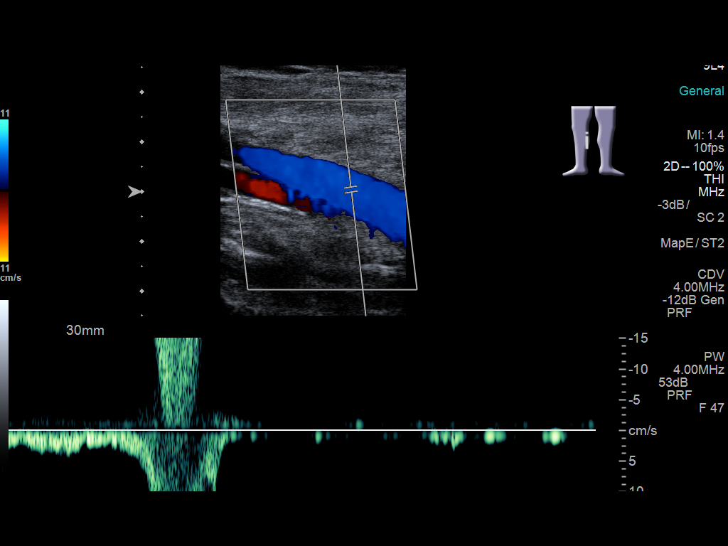
[im 31/60]
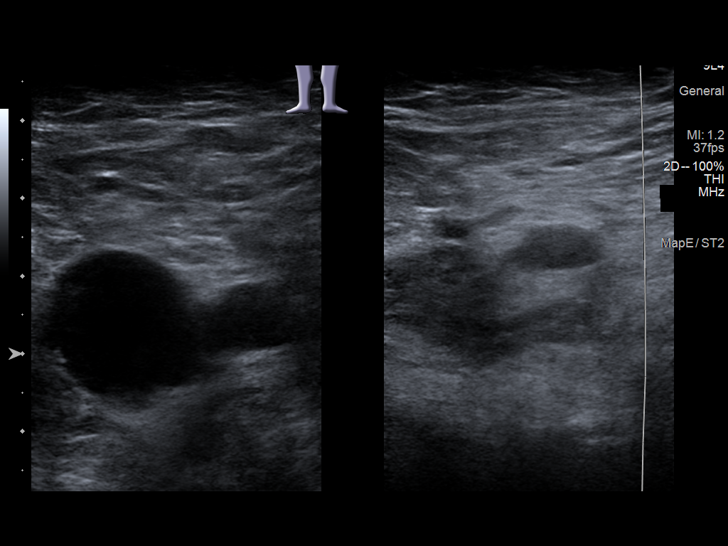
[im 34/60]
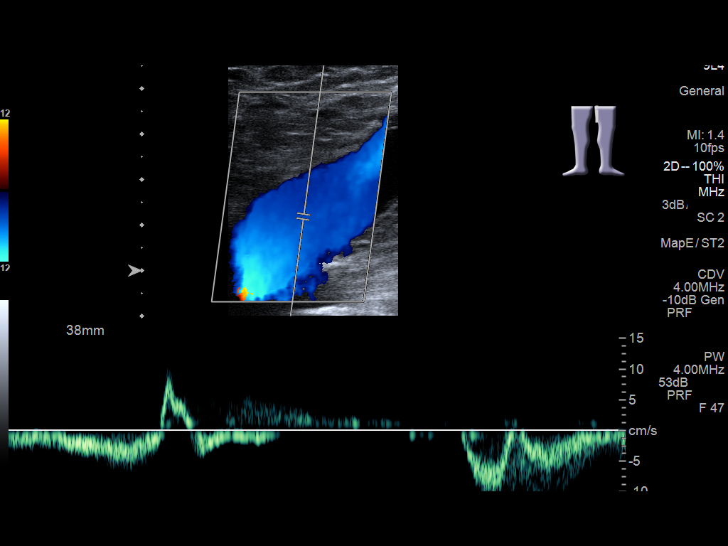
[im 39/60]
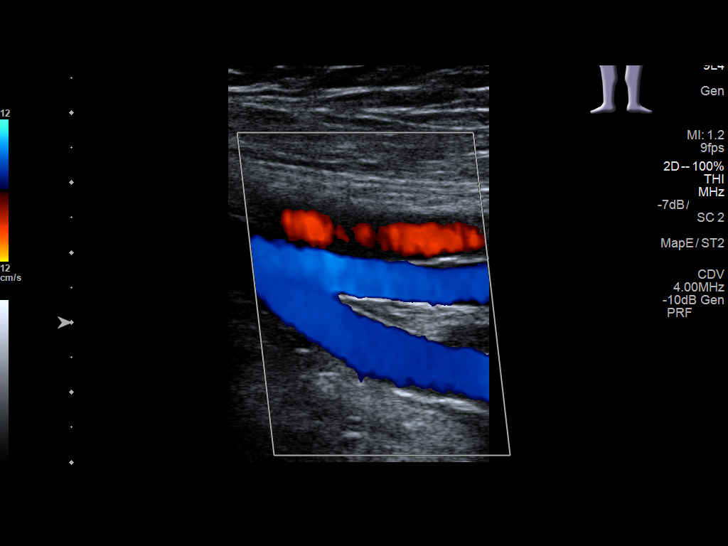
[im 44/60]
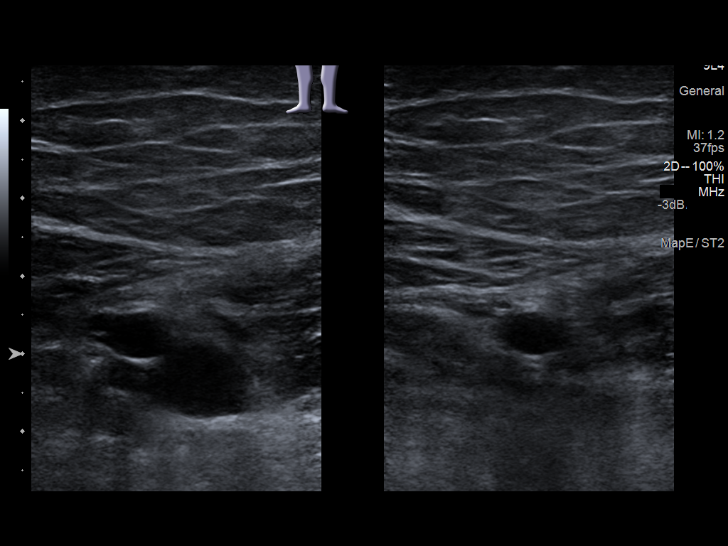
[im 49/60]
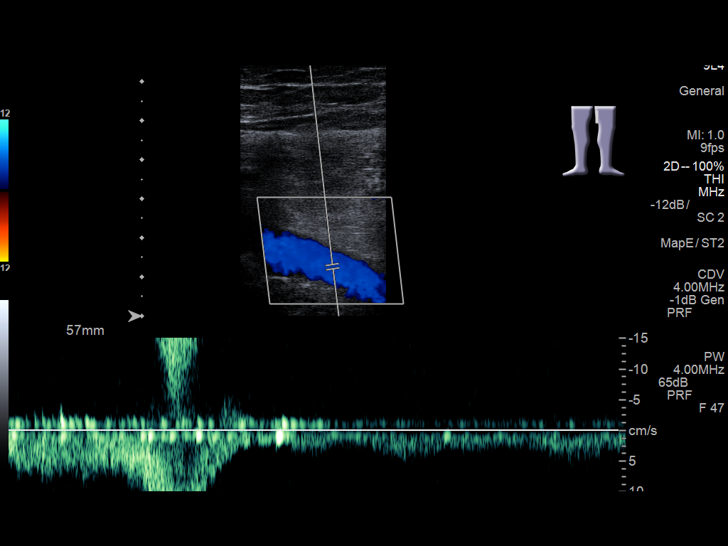
[im 54/60]
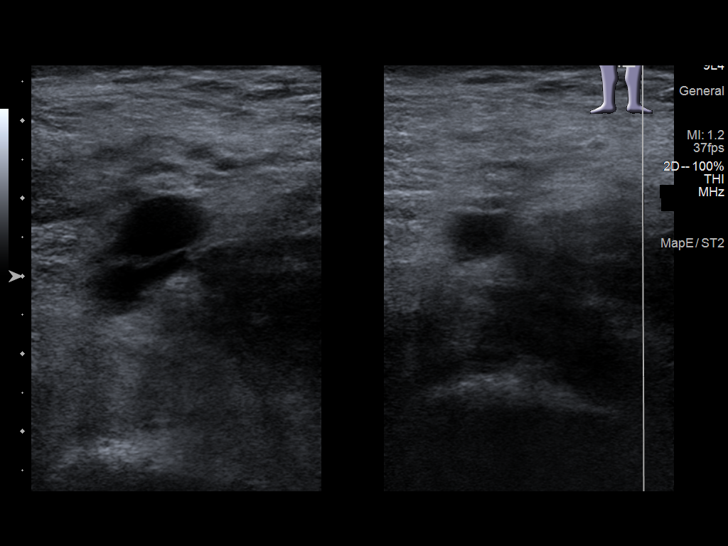
[im 60/60]
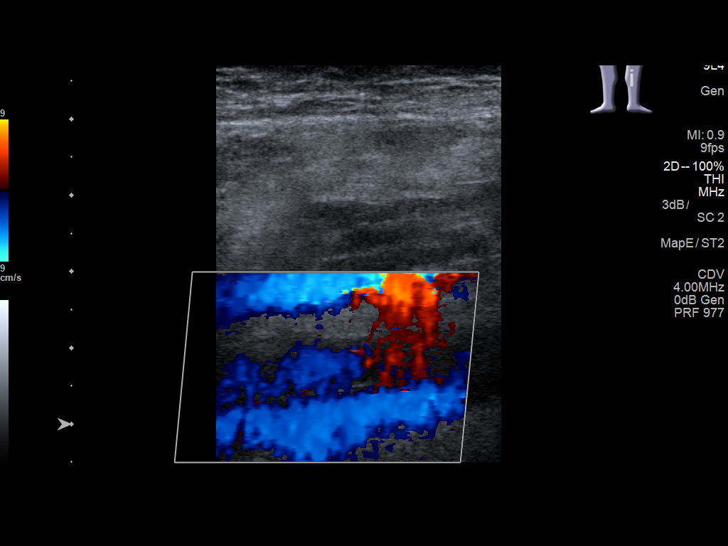

[13 of 24 positions shown; findings below may reference images not displayed]

FINDINGS: RIGHT LOWER EXTREMITY

Common Femoral Vein: No evidence of thrombus. Normal
compressibility, respiratory phasicity and response to augmentation.

Saphenofemoral Junction: No evidence of thrombus. Normal
compressibility and flow on color Doppler imaging.

Profunda Femoral Vein: No evidence of thrombus. Normal
compressibility and flow on color Doppler imaging.

Femoral Vein: No evidence of thrombus. Normal compressibility,
respiratory phasicity and response to augmentation.

Popliteal Vein: No evidence of thrombus. Normal compressibility,
respiratory phasicity and response to augmentation.

Calf Veins: No evidence of thrombus. Normal compressibility and flow
on color Doppler imaging.

Superficial Great Saphenous Vein: No evidence of thrombus. Normal
compressibility.

Venous Reflux:  None.

Other Findings:  None.

LEFT LOWER EXTREMITY

Common Femoral Vein: No evidence of thrombus. Normal
compressibility, respiratory phasicity and response to augmentation.

Saphenofemoral Junction: No evidence of thrombus. Normal
compressibility and flow on color Doppler imaging.

Profunda Femoral Vein: No evidence of thrombus. Normal
compressibility and flow on color Doppler imaging.

Femoral Vein: No evidence of thrombus. Normal compressibility,
respiratory phasicity and response to augmentation.

Popliteal Vein: No evidence of thrombus. Normal compressibility,
respiratory phasicity and response to augmentation.

Calf Veins: No evidence of thrombus. Normal compressibility and flow
on color Doppler imaging.

Superficial Great Saphenous Vein: No evidence of thrombus. Normal
compressibility.

Venous Reflux:  None.

Other Findings:  None.
IMPRESSION: No evidence of DVT in either lower extremity.

## 2022-02-11 ENCOUNTER — Ambulatory Visit (LOCAL_COMMUNITY_HEALTH_CENTER): Payer: Medicare HMO

## 2022-02-11 DIAGNOSIS — Z719 Counseling, unspecified: Secondary | ICD-10-CM

## 2022-02-11 DIAGNOSIS — Z23 Encounter for immunization: Secondary | ICD-10-CM | POA: Diagnosis not present

## 2022-02-11 NOTE — Progress Notes (Signed)
?  Are you feeling sick today? No ? ? ?Have you ever received a dose of COVID-19 Vaccine? [employer], [place], [place], [place], Other) Yes ? ?If yes, which vaccine and how many doses?   5 ? ? ?Did you bring the vaccination record card or other documentation?  Yes ? ? ?Do you have a health condition or are undergoing treatment that makes you moderately or severely immunocompromised? This would include, but not be limited to: cancer, HIV, organ transplant, immunosuppressive therapy/high-dose corticosteroids, or moderate/severe primary immunodeficiency.  No ? ?Have you received COVID-19 vaccine before or during hematopoietic cell transplant (HCT) or CAR-T-cell therapies? No ? ?Have you ever had an allergic reaction to: (This would include a severe allergic reaction or a reaction that caused hives, swelling, or respiratory distress, including wheezing.) A component of a COVID-19 vaccine or a previous dose of COVID-19 vaccine? No ? ? ?Have you ever had an allergic reaction to another vaccine (other thanCOVID-19 vaccine) or an injectable medication? (This would include a severe allergic reaction or a reaction that caused hives, swelling, or respiratory distress, including wheezing.)   No ?  ?Do you have a history of any of the following: ? ?Myocarditis or Pericarditis No ?Thrombosis with thrombocytopenia syndrome (TTS) No ?Multisystem Inflammatory Syndrome (MIS-C or MIS-A)? No ?Immune-mediate syndrome defined by thrombosis and thrombocytopenia, such as heparin--induced thrombocytopenia (HIT)  No ?Guillain-Barr? Syndrome (GBS) No ?COVID-19 disease within the past 3 months? No ?Vaccinated with monkeypox vaccine in the last 4 weeks? No ? ? ?Pfizer BV booster #2 administered IM right deltoid.  Stayed for 15 minutes after injection per pt's request and no problems.  [facility] updated; copy to pt with updated covid vaccination card.   ? ?Tonny Branch, RN  ?  ?

## 2022-09-05 ENCOUNTER — Ambulatory Visit: Payer: Medicare HMO

## 2022-09-10 ENCOUNTER — Ambulatory Visit: Payer: Medicare HMO

## 2023-01-30 ENCOUNTER — Ambulatory Visit: Payer: Medicare HMO | Admitting: Urology

## 2023-01-30 ENCOUNTER — Encounter: Payer: Self-pay | Admitting: Urology

## 2023-01-30 VITALS — BP 146/88 | HR 84 | Ht 67.0 in | Wt 201.1 lb

## 2023-01-30 DIAGNOSIS — C61 Malignant neoplasm of prostate: Secondary | ICD-10-CM

## 2023-01-30 DIAGNOSIS — Z8546 Personal history of malignant neoplasm of prostate: Secondary | ICD-10-CM | POA: Diagnosis not present

## 2023-01-30 NOTE — Progress Notes (Signed)
I, [name],acting as a scribe for Johnny Altes, MD.,have documented all relevant documentation on the behalf of Johnny Altes, MD,as directed by  Johnny Altes, MD while in the presence of Johnny Altes, MD.  01/30/2023 2:22 PM   Johnny Freeman 1951-08-01 [id number]  Referring provider: Gracelyn Nurse, MD [address] [place],  [place] [address]  Chief Complaint  Patient presents with   Establish Care    HPI: Johnny Freeman is a 72 y.o. male who presents for transfer of urologic care after the retirement of his previous urologist.  Followed by Dr. Evelene Freeman for history of T1c low-risk prostate cancer. Treated w/HIFU in 2011. Last visit with Dr. Evelene Freeman was 11/14/2021 and his PSA was stable at 0.2. Doing well since last visit No bothersome LUTS Denies dysuria, gross hematuria Denies flank, abdominal or pelvic pain   PMH: Past Medical History:  Diagnosis Date   Anxiety    Cancer [facility])    prostate 2011   Complication of anesthesia    Dyspnea    GERD (gastroesophageal reflux disease)    Headache    migraines   Hypertension    PE (pulmonary thromboembolism) ([facility])    PONV (postoperative nausea and vomiting)    nauseated after prostate bx HIFU   Prostate cancer [facility])     Surgical History: Past Surgical History:  Procedure Laterality Date   CYSTOSCOPY N/A 11/04/2017   Procedure: CYSTOSCOPY;  Surgeon: Kennedy Bucker, MD;  Location: [facility] ORS;  Service: Orthopedics;  Laterality: N/A;   IVC FILTER INSERTION N/A 02/23/2019   Procedure: IVC FILTER INSERTION;  Surgeon: Annice Needy, MD;  Location: [facility] INVASIVE [facility] LAB;  Service: Cardiovascular;  Laterality: N/A;   IVC FILTER REMOVAL N/A 05/10/2019   Procedure: IVC FILTER REMOVAL;  Surgeon: Annice Needy, MD;  Location: [facility] INVASIVE [facility] LAB;  Service: Cardiovascular;  Laterality: N/A;   PROSTATE SURGERY     ROTATOR CUFF REPAIR Right 2003   TOTAL KNEE ARTHROPLASTY Left 11/04/2017   Procedure: TOTAL KNEE  ARTHROPLASTY;  Surgeon: Kennedy Bucker, MD;  Location: [facility] ORS;  Service: Orthopedics;  Laterality: Left;   TOTAL KNEE ARTHROPLASTY Right 03/02/2019   Procedure: TOTAL KNEE ARTHROPLASTY RIGHT;  Surgeon: Kennedy Bucker, MD;  Location: [facility] ORS;  Service: Orthopedics;  Laterality: Right;    Home Medications:  Allergies as of 01/30/2023       Reactions   Shellfish Allergy Swelling   Swelling in eyes        Medication List        Accurate as of Jan 30, 2023  2:22 PM. If you have any questions, ask your Freeman or doctor.          acetaminophen 500 MG tablet Commonly known as: TYLENOL Take 500 mg by mouth 2 (two) times daily as needed (FOR PAIN.).   aspirin EC 81 MG tablet Take 81 mg by mouth daily. Takes 2 daily   atorvastatin 40 MG tablet Commonly known as: LIPITOR   benazepril 10 MG tablet Commonly known as: LOTENSIN Take 10 mg by mouth daily.   Biotin 1 MG Caps Take by mouth.   D2000 Ultra Strength 50 MCG (2000 UT) Caps Generic drug: Cholecalciferol Take by mouth.   diltiazem 180 MG 24 hr capsule Commonly known as: CARDIZEM CD Take 180 mg by mouth every morning.   docusate sodium 100 MG capsule Commonly known as: COLACE Take 100 mg by mouth every other day.   Multi-Vitamin  tablet Take by mouth.   omeprazole 20 MG capsule Commonly known as: PRILOSEC TAKE 1 CAPSULE EVERY DAY   PROBIOTIC-10 PO Take 1 tablet by mouth daily.   vitamin C 1000 MG tablet Take 1,000 mg by mouth daily.        Allergies:  Allergies  Allergen Reactions   Shellfish Allergy Swelling    Swelling in eyes    Family History: Family History  Problem Relation Age of Onset   Heart attack Father     Social History:  reports that he quit smoking about 50 years ago. He has never used smokeless tobacco. He reports that he does not drink alcohol and does not use drugs.   Physical Exam: BP (!) 146/88   Pulse 84   Ht 5\' 7"  (1.702 m)   Wt 201 lb 1 oz (91.2 kg)   BMI 31.49 kg/m    Constitutional:  Alert and oriented, No acute distress. HEENT: [place] AT Respiratory: Normal respiratory effort, no increased work of breathing. GI: Abdomen is soft, nontender, nondistended, no abdominal masses GU: Prostate flat, smooth, barely palpable. Psychiatric: Normal mood and affect.   Assessment & Plan:    History of prostate cancer s/p HIFU 2011 for T1c low risk prostate cancer. PSA drawn today. He is >5 years out from prostate cancer treatment and would recommend annual PSA. Follow up one year.  I have reviewed the above documentation for accuracy and completeness, and I agree with the above.   Johnny Altes, MD  [facility] [address] [place], [place] [address] [phone]

## 2023-01-31 ENCOUNTER — Encounter: Payer: Self-pay | Admitting: *Deleted

## 2023-01-31 LAB — PSA: Prostate Specific Ag, Serum: 0.3 ng/mL (ref 0.0–4.0)

## 2023-02-18 ENCOUNTER — Encounter: Payer: Self-pay | Admitting: Ophthalmology

## 2023-02-18 NOTE — Anesthesia Preprocedure Evaluation (Addendum)
Anesthesia Evaluation  Patient identified by MRN, date of birth, ID band Patient awake    Reviewed: Allergy & Precautions, H&P , NPO status , Patient's Chart, lab work & pertinent test results  History of Anesthesia Complications (+) PONV and history of anesthetic complications  Airway Mallampati: III  TM Distance: >3 FB Neck ROM: Full    Dental  (+) Caps Upper central incisors caps/crowns:   Pulmonary neg pulmonary ROS, former smoker   Pulmonary exam normal breath sounds clear to auscultation       Cardiovascular hypertension, + CAD  Normal cardiovascular exam Rhythm:Regular Rate:Normal  2019 echo EF 55-60%, grade I diastolic dysfunction   Neuro/Psych   Anxiety      negative psych ROS   GI/Hepatic Neg liver ROS,,,  Endo/Other  negative endocrine ROS    Renal/GU negative Renal ROS  negative genitourinary   Musculoskeletal   Abdominal   Peds negative pediatric ROS (+)  Hematology negative hematology ROS (+)   Anesthesia Other Findings Anxiety GERD (gastroesophageal reflux disease) HeadacheCancer (HCC)  Hypertension Dyspnea PE (pulmonary thromboembolism) PONV (postoperative nausea and vomiting) Prostate cancer  Arthritis    Reproductive/Obstetrics negative OB ROS                             Anesthesia Physical Anesthesia Plan  ASA: 3  Anesthesia Plan: MAC   Post-op Pain Management:    Induction: Intravenous  PONV Risk Score and Plan:   Airway Management Planned: Natural Airway and Nasal Cannula  Additional Equipment:   Intra-op Plan:   Post-operative Plan:   Informed Consent: I have reviewed the patients History and Physical, chart, labs and discussed the procedure including the risks, benefits and alternatives for the proposed anesthesia with the patient or authorized representative who has indicated his/her understanding and acceptance.     Dental Advisory  Given  Plan Discussed with: Anesthesiologist, CRNA and Surgeon  Anesthesia Plan Comments: (Patient consented for risks of anesthesia including but not limited to:  - adverse reactions to medications - damage to eyes, teeth, lips or other oral mucosa - nerve damage due to positioning  - sore throat or hoarseness - Damage to heart, brain, nerves, lungs, other parts of body or loss of life  Patient voiced understanding.)        Anesthesia Quick Evaluation

## 2023-02-20 NOTE — Discharge Instructions (Signed)

## 2023-02-25 ENCOUNTER — Other Ambulatory Visit: Payer: Self-pay

## 2023-02-25 ENCOUNTER — Ambulatory Visit: Payer: Medicare HMO | Admitting: Anesthesiology

## 2023-02-25 ENCOUNTER — Ambulatory Visit
Admission: RE | Admit: 2023-02-25 | Discharge: 2023-02-25 | Disposition: A | Discharge status: Home / Self Care | Payer: Medicare HMO | Source: Ambulatory Visit | Attending: Ophthalmology | Admitting: Ophthalmology

## 2023-02-25 ENCOUNTER — Encounter
Admission: RE | Disposition: A | Discharge status: Home / Self Care | Payer: Self-pay | Source: Ambulatory Visit | Attending: Ophthalmology

## 2023-02-25 DIAGNOSIS — H2511 Age-related nuclear cataract, right eye: Secondary | ICD-10-CM | POA: Diagnosis present

## 2023-02-25 DIAGNOSIS — Z8249 Family history of ischemic heart disease and other diseases of the circulatory system: Secondary | ICD-10-CM | POA: Insufficient documentation

## 2023-02-25 DIAGNOSIS — Z87891 Personal history of nicotine dependence: Secondary | ICD-10-CM | POA: Insufficient documentation

## 2023-02-25 DIAGNOSIS — I2510 Atherosclerotic heart disease of native coronary artery without angina pectoris: Secondary | ICD-10-CM | POA: Diagnosis not present

## 2023-02-25 DIAGNOSIS — I10 Essential (primary) hypertension: Secondary | ICD-10-CM | POA: Diagnosis not present

## 2023-02-25 HISTORY — DX: Unspecified osteoarthritis, unspecified site: M19.90

## 2023-02-25 HISTORY — PX: CATARACT EXTRACTION W/PHACO: SHX586

## 2023-02-25 HISTORY — DX: Other ill-defined heart diseases: I51.89

## 2023-02-25 SURGERY — PHACOEMULSIFICATION, CATARACT, WITH IOL INSERTION
Anesthesia: Monitor Anesthesia Care | Site: Eye | Laterality: Right

## 2023-02-25 MED ORDER — MIDAZOLAM HCL 2 MG/2ML IJ SOLN
INTRAMUSCULAR | Status: DC | PRN
  Administered 2023-02-25: 2 mg via INTRAVENOUS

## 2023-02-25 MED ORDER — SIGHTPATH DOSE#1 NA CHONDROIT SULF-NA HYALURON 40-17 MG/ML IO SOLN
INTRAOCULAR | Status: DC | PRN
  Administered 2023-02-25: 1 mL via INTRAOCULAR

## 2023-02-25 MED ORDER — SIGHTPATH DOSE#1 BSS IO SOLN
INTRAOCULAR | Status: DC | PRN
  Administered 2023-02-25: 1 mL via INTRAMUSCULAR

## 2023-02-25 MED ORDER — SIGHTPATH DOSE#1 BSS IO SOLN
INTRAOCULAR | Status: DC | PRN
  Administered 2023-02-25: 46 mL via OPHTHALMIC

## 2023-02-25 MED ORDER — LACTATED RINGERS IV SOLN: INTRAVENOUS | Status: DC

## 2023-02-25 MED ORDER — ARMC OPHTHALMIC DILATING DROPS
1.0000 | OPHTHALMIC | Status: DC | PRN
  Administered 2023-02-25 (×3): 1 via OPHTHALMIC

## 2023-02-25 MED ORDER — MOXIFLOXACIN HCL 0.5 % OP SOLN
OPHTHALMIC | Status: DC | PRN
  Administered 2023-02-25: .2 mL via OPHTHALMIC

## 2023-02-25 MED ORDER — TETRACAINE HCL 0.5 % OP SOLN
1.0000 [drp] | OPHTHALMIC | Status: DC | PRN
  Administered 2023-02-25 (×3): 1 [drp] via OPHTHALMIC

## 2023-02-25 MED ORDER — SIGHTPATH DOSE#1 BSS IO SOLN
INTRAOCULAR | Status: DC | PRN
  Administered 2023-02-25: 15 mL

## 2023-02-25 MED ORDER — BRIMONIDINE TARTRATE-TIMOLOL 0.2-0.5 % OP SOLN
OPHTHALMIC | Status: DC | PRN
  Administered 2023-02-25: 1 [drp] via OPHTHALMIC

## 2023-02-25 MED ORDER — ONDANSETRON HCL 4 MG/2ML IJ SOLN
4.0000 mg | Freq: Once | INTRAMUSCULAR | Status: AC
  Administered 2023-02-25: 4 mg via INTRAVENOUS

## 2023-02-25 SURGICAL SUPPLY — 11 items
ANGLE REVERSE CUT SHRT 25GA (CUTTER) ×1
CATARACT SUITE SIGHTPATH (MISCELLANEOUS) ×1 IMPLANT
CYSTOTOME ANGL RVRS SHRT 25G (CUTTER) ×1 IMPLANT
CYSTOTOME ANGL RVRS SHRT 25GA (CUTTER) ×1 IMPLANT
FEE CATARACT SUITE SIGHTPATH (MISCELLANEOUS) ×1 IMPLANT
GLOVE BIOGEL PI IND STRL 8 (GLOVE) ×1 IMPLANT
GLOVE SURG ENC TEXT LTX SZ8 (GLOVE) ×1 IMPLANT
LENS IOL TECNIS EYHANCE 21.0 (Intraocular Lens) IMPLANT
NDL FILTER BLUNT 18X1 1/2 (NEEDLE) ×1 IMPLANT
NEEDLE FILTER BLUNT 18X1 1/2 (NEEDLE) ×1 IMPLANT
SYR 3ML LL SCALE MARK (SYRINGE) ×1 IMPLANT

## 2023-02-25 NOTE — Op Note (Signed)
PREOPERATIVE DIAGNOSIS:  Nuclear sclerotic cataract of the right eye.   POSTOPERATIVE DIAGNOSIS:  H25.11 Cataract   OPERATIVE PROCEDURE:ORPROCALL@   SURGEON:  Galen Manila, MD.   ANESTHESIA:  Anesthesiologist: Marisue Humble, MD CRNA: Emeterio Reeve, CRNA  1.      Managed anesthesia care. 2.      0.40ml of Shugarcaine was instilled in the eye following the paracentesis.   COMPLICATIONS:  None.   TECHNIQUE:   Stop and chop   DESCRIPTION OF PROCEDURE:  The patient was examined and consented in the preoperative holding area where the aforementioned topical anesthesia was applied to the right eye and then brought back to the Operating Room where the right eye was prepped and draped in the usual sterile ophthalmic fashion and a lid speculum was placed. A paracentesis was created with the side port blade and the anterior chamber was filled with viscoelastic. A near clear corneal incision was performed with the steel keratome. A continuous curvilinear capsulorrhexis was performed with a cystotome followed by the capsulorrhexis forceps. Hydrodissection and hydrodelineation were carried out with BSS on a blunt cannula. The lens was removed in a stop and chop  technique and the remaining cortical material was removed with the irrigation-aspiration handpiece. The capsular bag was inflated with viscoelastic and the Technis [id number]  lens was placed in the capsular bag without complication. The remaining viscoelastic was removed from the eye with the irrigation-aspiration handpiece. The wounds were hydrated. The anterior chamber was flushed with BSS and the eye was inflated to physiologic pressure. 0.41ml of Vigamox was placed in the anterior chamber. The wounds were found to be water tight. The eye was dressed with Combigan. The patient was given protective glasses to wear throughout the day and a shield with which to sleep tonight. The patient was also given drops with which to begin a drop regimen  today and will follow-up with me in one day. Implant Name Type Inv. Item Serial No. Manufacturer Lot No. LRB No. Used Action  LENS IOL TECNIS EYHANCE 21.0 - [id number] Intraocular Lens LENS IOL TECNIS EYHANCE 21.0 [id number] [employer]  Right 1 Implanted   Procedure(s): CATARACT EXTRACTION PHACO AND INTRAOCULAR LENS PLACEMENT (IOC) RIGHT  7.23   00:38.2 (Right)  Electronically signed: Galen Manila 02/25/2023 10:07 AM

## 2023-02-25 NOTE — H&P (Signed)
[facility]   Primary Care Physician:  Gracelyn Nurse, MD Ophthalmologist: Dr. Druscilla Brownie  Pre-Procedure History & Physical: HPI:  Johnny Freeman is a 72 y.o. male here for cataract surgery.   Past Medical History:  Diagnosis Date   Anxiety    Arthritis    hands   Cancer (HCC)    prostate 2011   Complication of anesthesia    Dyspnea    GERD (gastroesophageal reflux disease)    Grade I diastolic dysfunction    Headache    migraines   Hypertension    PE (pulmonary thromboembolism) (HCC) 2019   after knee surgery   PONV (postoperative nausea and vomiting)    nauseated after prostate bx HIFU   Prostate cancer [facility])     Past Surgical History:  Procedure Laterality Date   CYSTOSCOPY N/A 11/04/2017   Procedure: CYSTOSCOPY;  Surgeon: Kennedy Bucker, MD;  Location: [facility] ORS;  Service: Orthopedics;  Laterality: N/A;   IVC FILTER INSERTION N/A 02/23/2019   Procedure: IVC FILTER INSERTION;  Surgeon: Annice Needy, MD;  Location: [facility] INVASIVE CV LAB;  Service: Cardiovascular;  Laterality: N/A;   IVC FILTER REMOVAL N/A 05/10/2019   Procedure: IVC FILTER REMOVAL;  Surgeon: Annice Needy, MD;  Location: [facility] INVASIVE CV LAB;  Service: Cardiovascular;  Laterality: N/A;   PROSTATE SURGERY     ROTATOR CUFF REPAIR Right 2003   TOTAL KNEE ARTHROPLASTY Left 11/04/2017   Procedure: TOTAL KNEE ARTHROPLASTY;  Surgeon: Kennedy Bucker, MD;  Location: [facility] ORS;  Service: Orthopedics;  Laterality: Left;   TOTAL KNEE ARTHROPLASTY Right 03/02/2019   Procedure: TOTAL KNEE ARTHROPLASTY RIGHT;  Surgeon: Kennedy Bucker, MD;  Location: [facility] ORS;  Service: Orthopedics;  Laterality: Right;    Prior to Admission medications   Medication Sig Start Date End Date Taking? Authorizing Provider  acetaminophen (TYLENOL) 500 MG tablet Take 500 mg by mouth 2 (two) times daily as needed (FOR PAIN.).   Yes [provider]  Ascorbic Acid (VITAMIN C) 1000 MG tablet Take 1,000 mg by mouth daily.   Yes [provider]  aspirin EC 81 MG tablet Take 81 mg by mouth daily. Takes 2 daily   Yes [provider]  atorvastatin (LIPITOR) 40 MG tablet  06/03/19  Yes [provider]  benazepril (LOTENSIN) 10 MG tablet Take 10 mg by mouth daily.   Yes [provider]  Biotin 1 MG CAPS Take by mouth.   Yes [provider]  Cholecalciferol (D2000 ULTRA STRENGTH) 50 MCG (2000 UT) CAPS Take by mouth.   Yes [provider]  diclofenac Sodium (VOLTAREN ARTHRITIS PAIN) 1 % GEL Apply topically as needed.   Yes [provider]  diltiazem (CARDIZEM CD) 180 MG 24 hr capsule Take 180 mg by mouth every morning.  05/05/18  Yes [provider]  docusate sodium (COLACE) 100 MG capsule Take 100 mg by mouth every other day.    Yes [provider]  Multiple Vitamin (MULTI-VITAMIN) tablet Take by mouth.   Yes [provider]  omeprazole (PRILOSEC) 20 MG capsule TAKE 1 CAPSULE EVERY DAY 02/02/19  Yes [provider]  Probiotic Product (PROBIOTIC-10 PO) Take 1 tablet by mouth daily.   Yes [provider]  triamcinolone cream (KENALOG) 0.1 % Apply 1 Application topically as needed.   Yes [provider]    Allergies as of 02/11/2023 - Review Complete 01/30/2023  Allergen Reaction Noted   Shellfish allergy Swelling 11/04/2017  Family History  Problem Relation Age of Onset   Heart attack Father     Social History   Socioeconomic History   Marital status: Married    Spouse name: Not on file   Number of children: Not on file   Years of education: Not on file   Highest education level: Not on file  Occupational History   Not on file  Tobacco Use   Smoking status: Former    Types: Cigarettes    Quit date: 10/03/1972    Years since quitting: 50.4   Smokeless tobacco: Never  Vaping Use   Vaping Use: Never used  Substance and Sexual Activity   Alcohol use: No    Alcohol/week: 0.0 standard drinks of alcohol   Drug  use: No   Sexual activity: Not on file  Other Topics Concern   Not on file  Social History Narrative   Not on file   Social Determinants of Health   Financial Resource Strain: Not on file  Food Insecurity: Not on file  Transportation Needs: Not on file  Physical Activity: Not on file  Stress: Not on file  Social Connections: Not on file  Intimate Partner Violence: Not on file    Review of Systems: See HPI, otherwise negative ROS  Physical Exam: BP (!) 153/77   Pulse 81   Temp 98.3 F (36.8 C) (Temporal)   Resp 12   Ht 5\' 7"  (1.702 m)   Wt 90.3 kg   SpO2 98%   BMI 31.17 kg/m  General:   Alert, cooperative in NAD Head:  Normocephalic and atraumatic. Respiratory:  Normal work of breathing. Cardiovascular:  RRR  Impression/Plan: Johnny Freeman is here for cataract surgery.  Risks, benefits, limitations, and alternatives regarding cataract surgery have been reviewed with the patient.  Questions have been answered.  All parties agreeable.   Galen Manila, MD  02/25/2023, 9:08 AM

## 2023-02-25 NOTE — Transfer of Care (Signed)
Immediate Anesthesia Transfer of Care Note  Patient: Johnny Freeman  Procedure(s) Performed: CATARACT EXTRACTION PHACO AND INTRAOCULAR LENS PLACEMENT (IOC) RIGHT  7.23   00:38.2 (Right: Eye)  Patient Location: PACU  Anesthesia Type: MAC  Level of Consciousness: awake, alert  and patient cooperative  Airway and Oxygen Therapy: Patient Spontanous Breathing and Patient connected to supplemental oxygen  Post-op Assessment: Post-op Vital signs reviewed, Patient's Cardiovascular Status Stable, Respiratory Function Stable, Patent Airway and No signs of Nausea or vomiting  Post-op Vital Signs: Reviewed and stable  Complications: No notable events documented.

## 2023-02-25 NOTE — Anesthesia Postprocedure Evaluation (Signed)
Anesthesia Post Note  Patient: WILLIE GRIBBINS  Procedure(s) Performed: CATARACT EXTRACTION PHACO AND INTRAOCULAR LENS PLACEMENT (IOC) RIGHT  7.23   00:38.2 (Right: Eye)  Patient location during evaluation: PACU Anesthesia Type: MAC Level of consciousness: awake and alert Pain management: pain level controlled Vital Signs Assessment: post-procedure vital signs reviewed and stable Respiratory status: spontaneous breathing, nonlabored ventilation, respiratory function stable and patient connected to nasal cannula oxygen Cardiovascular status: stable and blood pressure returned to baseline Postop Assessment: no apparent nausea or vomiting Anesthetic complications: no   No notable events documented.   Last Vitals:  Vitals:   02/25/23 1009 02/25/23 1014  BP: 113/72 119/74  Pulse: 72 70  Resp: 18 18  Temp: 36.7 C 36.7 C  SpO2: 98% 96%    Last Pain:  Vitals:   02/25/23 1014  TempSrc:   PainSc: 0-No pain                 Maryjean Corpening C Alijah Akram

## 2023-02-26 ENCOUNTER — Other Ambulatory Visit: Payer: Self-pay

## 2023-02-26 ENCOUNTER — Encounter: Payer: Self-pay | Admitting: Ophthalmology

## 2023-03-03 NOTE — Anesthesia Preprocedure Evaluation (Addendum)
Anesthesia Evaluation  Patient identified by MRN, date of birth, ID band Patient awake    Reviewed: Allergy & Precautions, H&P , NPO status , Patient's Chart, lab work & pertinent test results  History of Anesthesia Complications (+) PONV and history of anesthetic complications  Airway Mallampati: III  TM Distance: >3 FB Neck ROM: Full    Dental no notable dental hx.    Pulmonary shortness of breath, former smoker Hx pulmonary embolus   Pulmonary exam normal breath sounds clear to auscultation       Cardiovascular hypertension, + CAD  Normal cardiovascular exam Rhythm:Regular Rate:Normal   2019 echo EF 55-60%, grade I diastolic dysfunction      Neuro/Psych  Headaches  Anxiety     negative neurological ROS  negative psych ROS   GI/Hepatic negative GI ROS, Neg liver ROS,GERD  ,,  Endo/Other  negative endocrine ROS    Renal/GU negative Renal ROS  negative genitourinary   Musculoskeletal negative musculoskeletal ROS (+) Arthritis ,    Abdominal   Peds negative pediatric ROS (+)  Hematology negative hematology ROS (+) Blood dyscrasia, anemia   Anesthesia Other Findings Anxiety  GERD (gastroesophageal reflux disease) Headache  Cancer (HCC) Hypertension  Dyspnea PE (pulmonary thromboembolism) (HCC)  PONV (postoperative nausea and vomiting)  Plan zofran and try to avoid fentanyl if possible to help avoid PONV PONV After prostate biopsy and mild nausea after cataract Prostate cancer (HCC) Arthritis  Grade I diastolic dysfunction    Reproductive/Obstetrics negative OB ROS                             Anesthesia Physical Anesthesia Plan  ASA: 3  Anesthesia Plan: MAC   Post-op Pain Management:    Induction: Intravenous  PONV Risk Score and Plan:   Airway Management Planned: Natural Airway and Nasal Cannula  Additional Equipment:   Intra-op Plan:   Post-operative Plan:    Informed Consent: I have reviewed the patients History and Physical, chart, labs and discussed the procedure including the risks, benefits and alternatives for the proposed anesthesia with the patient or authorized representative who has indicated his/her understanding and acceptance.     Dental Advisory Given  Plan Discussed with: Anesthesiologist, CRNA and Surgeon  Anesthesia Plan Comments: (Patient consented for risks of anesthesia including but not limited to:  - adverse reactions to medications - damage to eyes, teeth, lips or other oral mucosa - nerve damage due to positioning  - sore throat or hoarseness - Damage to heart, brain, nerves, lungs, other parts of body or loss of life  Patient voiced understanding.)        Anesthesia Quick Evaluation

## 2023-03-06 NOTE — Discharge Instructions (Signed)

## 2023-03-11 ENCOUNTER — Ambulatory Visit
Admission: RE | Admit: 2023-03-11 | Discharge: 2023-03-11 | Disposition: A | Discharge status: Home / Self Care | Payer: Medicare HMO | Source: Ambulatory Visit | Attending: Ophthalmology | Admitting: Ophthalmology

## 2023-03-11 ENCOUNTER — Encounter
Admission: RE | Disposition: A | Discharge status: Home / Self Care | Payer: Self-pay | Source: Ambulatory Visit | Attending: Ophthalmology

## 2023-03-11 ENCOUNTER — Encounter: Payer: Self-pay | Admitting: Ophthalmology

## 2023-03-11 ENCOUNTER — Ambulatory Visit: Payer: Medicare HMO | Admitting: Anesthesiology

## 2023-03-11 ENCOUNTER — Other Ambulatory Visit: Payer: Self-pay

## 2023-03-11 DIAGNOSIS — Z86711 Personal history of pulmonary embolism: Secondary | ICD-10-CM | POA: Diagnosis not present

## 2023-03-11 DIAGNOSIS — M1990 Unspecified osteoarthritis, unspecified site: Secondary | ICD-10-CM | POA: Insufficient documentation

## 2023-03-11 DIAGNOSIS — I2510 Atherosclerotic heart disease of native coronary artery without angina pectoris: Secondary | ICD-10-CM | POA: Diagnosis not present

## 2023-03-11 DIAGNOSIS — Z8546 Personal history of malignant neoplasm of prostate: Secondary | ICD-10-CM | POA: Insufficient documentation

## 2023-03-11 DIAGNOSIS — Z87891 Personal history of nicotine dependence: Secondary | ICD-10-CM | POA: Diagnosis not present

## 2023-03-11 DIAGNOSIS — K219 Gastro-esophageal reflux disease without esophagitis: Secondary | ICD-10-CM | POA: Insufficient documentation

## 2023-03-11 DIAGNOSIS — I10 Essential (primary) hypertension: Secondary | ICD-10-CM | POA: Diagnosis not present

## 2023-03-11 DIAGNOSIS — H2512 Age-related nuclear cataract, left eye: Secondary | ICD-10-CM | POA: Diagnosis present

## 2023-03-11 HISTORY — PX: CATARACT EXTRACTION W/PHACO: SHX586

## 2023-03-11 SURGERY — PHACOEMULSIFICATION, CATARACT, WITH IOL INSERTION
Anesthesia: Monitor Anesthesia Care | Laterality: Left

## 2023-03-11 MED ORDER — SIGHTPATH DOSE#1 BSS IO SOLN
INTRAOCULAR | Status: DC | PRN
  Administered 2023-03-11: 49 mL via OPHTHALMIC

## 2023-03-11 MED ORDER — ARMC OPHTHALMIC DILATING DROPS
1.0000 | OPHTHALMIC | Status: DC | PRN
  Administered 2023-03-11 (×3): 1 via OPHTHALMIC

## 2023-03-11 MED ORDER — BRIMONIDINE TARTRATE-TIMOLOL 0.2-0.5 % OP SOLN
OPHTHALMIC | Status: DC | PRN
  Administered 2023-03-11: 1 [drp] via OPHTHALMIC

## 2023-03-11 MED ORDER — MIDAZOLAM HCL 2 MG/2ML IJ SOLN
INTRAMUSCULAR | Status: DC | PRN
  Administered 2023-03-11: 2 mg via INTRAVENOUS

## 2023-03-11 MED ORDER — SIGHTPATH DOSE#1 NA CHONDROIT SULF-NA HYALURON 40-17 MG/ML IO SOLN
INTRAOCULAR | Status: DC | PRN
  Administered 2023-03-11: 1 mL via INTRAOCULAR

## 2023-03-11 MED ORDER — SIGHTPATH DOSE#1 BSS IO SOLN
INTRAOCULAR | Status: DC | PRN
  Administered 2023-03-11: 15 mL via INTRAOCULAR

## 2023-03-11 MED ORDER — MOXIFLOXACIN HCL 0.5 % OP SOLN
OPHTHALMIC | Status: DC | PRN
  Administered 2023-03-11: .2 mL via OPHTHALMIC

## 2023-03-11 MED ORDER — SIGHTPATH DOSE#1 BSS IO SOLN
INTRAOCULAR | Status: DC | PRN
  Administered 2023-03-11: 2 mL

## 2023-03-11 MED ORDER — ONDANSETRON HCL 4 MG/2ML IJ SOLN
INTRAMUSCULAR | Status: DC | PRN
  Administered 2023-03-11: 4 mg via INTRAVENOUS

## 2023-03-11 MED ORDER — TETRACAINE HCL 0.5 % OP SOLN
1.0000 [drp] | OPHTHALMIC | Status: DC | PRN
  Administered 2023-03-11 (×3): 1 [drp] via OPHTHALMIC

## 2023-03-11 SURGICAL SUPPLY — 11 items
ANGLE REVERSE CUT SHRT 25GA (CUTTER) ×1
CATARACT SUITE SIGHTPATH (MISCELLANEOUS) ×1 IMPLANT
CYSTOTOME ANGL RVRS SHRT 25G (CUTTER) ×1 IMPLANT
CYSTOTOME ANGL RVRS SHRT 25GA (CUTTER) ×1 IMPLANT
FEE CATARACT SUITE SIGHTPATH (MISCELLANEOUS) ×1 IMPLANT
GLOVE BIOGEL PI IND STRL 8 (GLOVE) ×1 IMPLANT
GLOVE SURG ENC TEXT LTX SZ8 (GLOVE) ×1 IMPLANT
LENS IOL TECNIS EYHANCE 20.5 (Intraocular Lens) IMPLANT
NDL FILTER BLUNT 18X1 1/2 (NEEDLE) ×1 IMPLANT
NEEDLE FILTER BLUNT 18X1 1/2 (NEEDLE) ×1 IMPLANT
SYR 3ML LL SCALE MARK (SYRINGE) ×1 IMPLANT

## 2023-03-11 NOTE — Op Note (Signed)
PREOPERATIVE DIAGNOSIS:  Nuclear sclerotic cataract of the left eye.   POSTOPERATIVE DIAGNOSIS:  Nuclear sclerotic cataract of the left eye.   OPERATIVE PROCEDURE:ORPROCALL@   SURGEON:  Galen Manila, MD.   ANESTHESIA:  Anesthesiologist: Marisue Humble, MD CRNA: Barbette Hair, CRNA  1.      Managed anesthesia care. 2.     0.74ml of Shugarcaine was instilled following the paracentesis   COMPLICATIONS:  None.   TECHNIQUE:   Stop and chop   DESCRIPTION OF PROCEDURE:  The patient was examined and consented in the preoperative holding area where the aforementioned topical anesthesia was applied to the left eye and then brought back to the Operating Room where the left eye was prepped and draped in the usual sterile ophthalmic fashion and a lid speculum was placed. A paracentesis was created with the side port blade and the anterior chamber was filled with viscoelastic. A near clear corneal incision was performed with the steel keratome. A continuous curvilinear capsulorrhexis was performed with a cystotome followed by the capsulorrhexis forceps. Hydrodissection and hydrodelineation were carried out with BSS on a blunt cannula. The lens was removed in a stop and chop  technique and the remaining cortical material was removed with the irrigation-aspiration handpiece. The capsular bag was inflated with viscoelastic and the Technis [id number] lens was placed in the capsular bag without complication. The remaining viscoelastic was removed from the eye with the irrigation-aspiration handpiece. The wounds were hydrated. The anterior chamber was flushed with BSS and the eye was inflated to physiologic pressure. 0.60ml Vigamox was placed in the anterior chamber. The wounds were found to be water tight. The eye was dressed with Combigan. The patient was given protective glasses to wear throughout the day and a shield with which to sleep tonight. The patient was also given drops with which to begin a drop regimen  today and will follow-up with me in one day. Implant Name Type Inv. Item Serial No. Manufacturer Lot No. LRB No. Used Action  LENS IOL TECNIS EYHANCE 20.5 - [id number] Intraocular Lens LENS IOL TECNIS EYHANCE 20.5 [id number] [employer]  Left 1 Implanted    Procedure(s): CATARACT EXTRACTION PHACO AND INTRAOCULAR LENS PLACEMENT (IOC) LEFT 9.51 00:49.9 (Left)  Electronically signed: Galen Manila 03/11/2023 12:45 PM

## 2023-03-11 NOTE — H&P (Signed)
[facility]   Primary Care Physician:  Gracelyn Nurse, MD Ophthalmologist: Dr. Druscilla Brownie  Pre-Procedure History & Physical: HPI:  Johnny Freeman is a 72 y.o. male here for cataract surgery.   Past Medical History:  Diagnosis Date   Anxiety    Arthritis    hands   Cancer (HCC)    prostate 2011   Complication of anesthesia    Dyspnea    GERD (gastroesophageal reflux disease)    Grade I diastolic dysfunction    Headache    migraines   Hypertension    PE (pulmonary thromboembolism) (HCC) 2019   after knee surgery   PONV (postoperative nausea and vomiting)    nauseated after prostate bx HIFU   Prostate cancer [facility])     Past Surgical History:  Procedure Laterality Date   CATARACT EXTRACTION W/PHACO Right 02/25/2023   Procedure: CATARACT EXTRACTION PHACO AND INTRAOCULAR LENS PLACEMENT (IOC) RIGHT  7.23   00:38.2;  Surgeon: Galen Manila, MD;  Location: [facility];  Service: Ophthalmology;  Laterality: Right;   CYSTOSCOPY N/A 11/04/2017   Procedure: CYSTOSCOPY;  Surgeon: Kennedy Bucker, MD;  Location: [facility] ORS;  Service: Orthopedics;  Laterality: N/A;   IVC FILTER INSERTION N/A 02/23/2019   Procedure: IVC FILTER INSERTION;  Surgeon: Annice Needy, MD;  Location: [facility] INVASIVE CV LAB;  Service: Cardiovascular;  Laterality: N/A;   IVC FILTER REMOVAL N/A 05/10/2019   Procedure: IVC FILTER REMOVAL;  Surgeon: Annice Needy, MD;  Location: [facility] INVASIVE CV LAB;  Service: Cardiovascular;  Laterality: N/A;   PROSTATE SURGERY     ROTATOR CUFF REPAIR Right 2003   TOTAL KNEE ARTHROPLASTY Left 11/04/2017   Procedure: TOTAL KNEE ARTHROPLASTY;  Surgeon: Kennedy Bucker, MD;  Location: [facility] ORS;  Service: Orthopedics;  Laterality: Left;   TOTAL KNEE ARTHROPLASTY Right 03/02/2019   Procedure: TOTAL KNEE ARTHROPLASTY RIGHT;  Surgeon: Kennedy Bucker, MD;  Location: [facility] ORS;  Service: Orthopedics;  Laterality: Right;    Prior to Admission medications   Medication Sig Start Date End  Date Taking? Authorizing Provider  acetaminophen (TYLENOL) 500 MG tablet Take 500 mg by mouth 2 (two) times daily as needed (FOR PAIN.).   Yes [provider]  Ascorbic Acid (VITAMIN C) 1000 MG tablet Take 1,000 mg by mouth daily.   Yes [provider]  aspirin EC 81 MG tablet Take 81 mg by mouth daily. Takes 2 daily   Yes [provider]  atorvastatin (LIPITOR) 40 MG tablet  06/03/19  Yes [provider]  benazepril (LOTENSIN) 10 MG tablet Take 10 mg by mouth daily.   Yes [provider]  Biotin 1 MG CAPS Take by mouth.   Yes [provider]  Cholecalciferol (D2000 ULTRA STRENGTH) 50 MCG (2000 UT) CAPS Take by mouth.   Yes [provider]  diclofenac Sodium (VOLTAREN ARTHRITIS PAIN) 1 % GEL Apply topically as needed.   Yes [provider]  diltiazem (CARDIZEM CD) 180 MG 24 hr capsule Take 180 mg by mouth every morning.  05/05/18  Yes [provider]  docusate sodium (COLACE) 100 MG capsule Take 100 mg by mouth every other day.    Yes [provider]  Multiple Vitamin (MULTI-VITAMIN) tablet Take by mouth.   Yes [provider]  omeprazole (PRILOSEC) 20 MG capsule TAKE 1 CAPSULE EVERY DAY 02/02/19  Yes [provider]  Probiotic Product (PROBIOTIC-10 PO) Take 1 tablet by mouth daily.   Yes [provider]  triamcinolone  cream (KENALOG) 0.1 % Apply 1 Application topically as needed.   Yes [provider]    Allergies as of 02/11/2023 - Review Complete 01/30/2023  Allergen Reaction Noted   Shellfish allergy Swelling 11/04/2017    Family History  Problem Relation Age of Onset   Heart attack Father     Social History   Socioeconomic History   Marital status: Married    Spouse name: Not on file   Number of children: Not on file   Years of education: Not on file   Highest education level: Not on file  Occupational History   Not on file  Tobacco Use   Smoking status:  Former    Types: Cigarettes    Quit date: 10/03/1972    Years since quitting: 50.4   Smokeless tobacco: Never  Vaping Use   Vaping Use: Never used  Substance and Sexual Activity   Alcohol use: No    Alcohol/week: 0.0 standard drinks of alcohol   Drug use: No   Sexual activity: Not on file  Other Topics Concern   Not on file  Social History Narrative   Not on file   Social Determinants of Health   Financial Resource Strain: Not on file  Food Insecurity: Not on file  Transportation Needs: Not on file  Physical Activity: Not on file  Stress: Not on file  Social Connections: Not on file  Intimate Partner Violence: Not on file    Review of Systems: See HPI, otherwise negative ROS  Physical Exam: BP (!) 148/80   Pulse 82   Temp 97.7 F (36.5 C) (Temporal)   Resp 12   Ht 5\' 7"  (1.702 m)   Wt 88.9 kg   SpO2 97%   BMI 30.70 kg/m  General:   Alert, cooperative in NAD Head:  Normocephalic and atraumatic. Respiratory:  Normal work of breathing. Cardiovascular:  RRR  Impression/Plan: Johnny Freeman is here for cataract surgery.  Risks, benefits, limitations, and alternatives regarding cataract surgery have been reviewed with the patient.  Questions have been answered.  All parties agreeable.   Galen Manila, MD  03/11/2023, 12:23 PM

## 2023-03-11 NOTE — Transfer of Care (Signed)
Immediate Anesthesia Transfer of Care Note  Patient: Johnny Freeman  Procedure(s) Performed: CATARACT EXTRACTION PHACO AND INTRAOCULAR LENS PLACEMENT (IOC) LEFT 9.51 00:49.9 (Left)  Patient Location: PACU  Anesthesia Type: MAC  Level of Consciousness: awake, alert  and patient cooperative  Airway and Oxygen Therapy: Patient Spontanous Breathing and Patient connected to supplemental oxygen  Post-op Assessment: Post-op Vital signs reviewed, Patient's Cardiovascular Status Stable, Respiratory Function Stable, Patent Airway and No signs of Nausea or vomiting  Post-op Vital Signs: Reviewed and stable  Complications: No notable events documented.

## 2023-03-11 NOTE — Anesthesia Postprocedure Evaluation (Signed)
Anesthesia Post Note  Patient: Johnny Freeman  Procedure(s) Performed: CATARACT EXTRACTION PHACO AND INTRAOCULAR LENS PLACEMENT (IOC) LEFT 9.51 00:49.9 (Left)  Patient location during evaluation: PACU Anesthesia Type: MAC Level of consciousness: awake and alert Pain management: pain level controlled Vital Signs Assessment: post-procedure vital signs reviewed and stable Respiratory status: spontaneous breathing, nonlabored ventilation, respiratory function stable and patient connected to nasal cannula oxygen Cardiovascular status: stable and blood pressure returned to baseline Postop Assessment: no apparent nausea or vomiting Anesthetic complications: no   No notable events documented.   Last Vitals:  Vitals:   03/11/23 1246 03/11/23 1251  BP: 112/81 118/83  Pulse: 71 79  Resp: 18 17  Temp: 36.6 C 36.4 C  SpO2: 98% 96%    Last Pain:  Vitals:   03/11/23 1251  TempSrc:   PainSc: 0-No pain                 Johnny Freeman

## 2023-03-12 ENCOUNTER — Encounter: Payer: Self-pay | Admitting: Ophthalmology

## 2023-06-03 ENCOUNTER — Inpatient Hospital Stay: Payer: Medicare HMO

## 2023-06-03 ENCOUNTER — Inpatient Hospital Stay: Payer: Medicare HMO | Attending: Internal Medicine | Admitting: Internal Medicine

## 2023-06-03 ENCOUNTER — Encounter: Payer: Self-pay | Admitting: Internal Medicine

## 2023-06-03 DIAGNOSIS — D649 Anemia, unspecified: Secondary | ICD-10-CM | POA: Diagnosis present

## 2023-06-03 DIAGNOSIS — D696 Thrombocytopenia, unspecified: Secondary | ICD-10-CM | POA: Insufficient documentation

## 2023-06-03 DIAGNOSIS — Z87891 Personal history of nicotine dependence: Secondary | ICD-10-CM | POA: Insufficient documentation

## 2023-06-03 DIAGNOSIS — Z86711 Personal history of pulmonary embolism: Secondary | ICD-10-CM | POA: Diagnosis not present

## 2023-06-03 DIAGNOSIS — Z803 Family history of malignant neoplasm of breast: Secondary | ICD-10-CM | POA: Insufficient documentation

## 2023-06-03 DIAGNOSIS — Z79899 Other long term (current) drug therapy: Secondary | ICD-10-CM | POA: Insufficient documentation

## 2023-06-03 LAB — COMPREHENSIVE METABOLIC PANEL
ALT: 24 U/L (ref 0–44)
AST: 29 U/L (ref 15–41)
Albumin: 4.3 g/dL (ref 3.5–5.0)
Alkaline Phosphatase: 68 U/L (ref 38–126)
Anion gap: 9 (ref 5–15)
BUN: 11 mg/dL (ref 8–23)
CO2: 24 mmol/L (ref 22–32)
Calcium: 8.8 mg/dL — ABNORMAL LOW (ref 8.9–10.3)
Chloride: 98 mmol/L (ref 98–111)
Creatinine, Ser: 0.77 mg/dL (ref 0.61–1.24)
GFR, Estimated: >60 mL/min (ref >60)
Glucose, Bld: 102 mg/dL — ABNORMAL HIGH (ref 70–99)
Potassium: 3.9 mmol/L (ref 3.5–5.1)
Sodium: 131 mmol/L — ABNORMAL LOW (ref 135–145)
Total Bilirubin: 0.5 mg/dL (ref 0.3–1.2)
Total Protein: 7.4 g/dL (ref 6.5–8.1)

## 2023-06-03 LAB — CBC WITH DIFFERENTIAL/PLATELET
Abs Immature Granulocytes: 0.04 10*3/uL (ref 0.00–0.07)
Basophils Absolute: 0 10*3/uL (ref 0.0–0.1)
Basophils Relative: 1 %
Eosinophils Absolute: 0.1 10*3/uL (ref 0.0–0.5)
Eosinophils Relative: 3 %
HCT: 39.2 % (ref 39.0–52.0)
Hemoglobin: 13.4 g/dL (ref 13.0–17.0)
Immature Granulocytes: 1 %
Lymphocytes Relative: 29 %
Lymphs Abs: 1.4 10*3/uL (ref 0.7–4.0)
MCH: 31.5 pg (ref 26.0–34.0)
MCHC: 34.2 g/dL (ref 30.0–36.0)
MCV: 92 fL (ref 80.0–100.0)
Monocytes Absolute: 1.3 10*3/uL — ABNORMAL HIGH (ref 0.1–1.0)
Monocytes Relative: 27 %
Neutro Abs: 2 10*3/uL (ref 1.7–7.7)
Neutrophils Relative %: 39 %
Platelets: 124 10*3/uL — ABNORMAL LOW (ref 150–400)
RBC: 4.26 MIL/uL (ref 4.22–5.81)
RDW: 14.1 % (ref 11.5–15.5)
Smear Review: NORMAL
WBC: 4.8 10*3/uL (ref 4.0–10.5)
nRBC: 0 % (ref 0.0–0.2)

## 2023-06-03 LAB — RETICULOCYTES
Immature Retic Fract: 16.1 % — ABNORMAL HIGH (ref 2.3–15.9)
RBC.: 4.24 MIL/uL (ref 4.22–5.81)
Retic Count, Absolute: 86.9 10*3/uL (ref 19.0–186.0)
Retic Ct Pct: 2.1 % (ref 0.4–3.1)

## 2023-06-03 LAB — IRON AND TIBC
Iron: 77 ug/dL (ref 45–182)
Saturation Ratios: 20 % (ref 17.9–39.5)
TIBC: 382 ug/dL (ref 250–450)
UIBC: 305 ug/dL

## 2023-06-03 LAB — HEPATITIS C ANTIBODY: HCV Ab: NONREACTIVE

## 2023-06-03 LAB — FERRITIN: Ferritin: 26 ng/mL (ref 24–336)

## 2023-06-03 LAB — HEPATITIS B SURFACE ANTIGEN: Hepatitis B Surface Ag: NONREACTIVE

## 2023-06-03 LAB — VITAMIN B12: Vitamin B-12: 1052 pg/mL — ABNORMAL HIGH (ref 180–914)

## 2023-06-03 LAB — FOLATE: Folate: >40 ng/mL (ref >5.9)

## 2023-06-03 LAB — IMMATURE PLATELET FRACTION: Immature Platelet Fraction: 11.8 % — ABNORMAL HIGH (ref 1.2–8.6)

## 2023-06-03 LAB — LACTATE DEHYDROGENASE: LDH: 144 U/L (ref 98–192)

## 2023-06-03 LAB — HEPATITIS B CORE ANTIBODY, IGM: Hep B C IgM: NONREACTIVE

## 2023-06-03 NOTE — Assessment & Plan Note (Addendum)
#   Thrombocytopenia: Mild AUG 2024-117.  However slowly trending down over the last 1-2 years.  Along with mild anemia the etiology is unclear.   # I had a long discussion with patient regarding multiple etiologies including but not limited to liver disease/medications/autoimmune disease-ITP etc.  Less likely any malignant causes.  Discussed that ITP is a diagnosis of exclusion.    # Recommend further work-up including-CBC CMP LDH; review of peripheral smear; HIV/hepatitis work-up.  Ultrasound abdomen-to rule out cirrhosis/splenomegaly. Discussed the role of a bone marrow biopsy for further evaluation of hematologic disorder.  However hold off pending above work-up.   # 2019 Acute PE [-provoked after surgery]-  Positive for acute pulmonary emboli within distal left lower lobe pulmonary artery with extension of thrombus into multiple segmental and subsegmental branches of the left lower lobe. S/p IVC filter-currently off anticaogulation. Monitor for now.   Thank you Dr.Johnston for allowing me to participate in the care of your pleasant patient. Please do not hesitate to contact me with questions or concerns in the interim.  # DISPOSITION: # labs today- CBC CMP LDH; review of peripheral smear; HIV/hepatitis work-up;AFP- US abdomen # follow up in 2 weeks- MD; no labs- Dr.B

## 2023-06-03 NOTE — Progress Notes (Signed)
C/o hoarseness x3 weeks.  Trouble sleeping, no sleep aid.

## 2023-06-03 NOTE — Progress Notes (Signed)
[facility] CONSULT NOTE  Patient Care Team: Gracelyn Nurse, MD as PCP - General (Internal Medicine) Kennedy Bucker, MD as Consulting Physician (Orthopedic Surgery)  CHIEF COMPLAINTS/PURPOSE OF CONSULTATION: Thrombocytopenia  HISTORY OF PRESENTING ILLNESS: Patient ambulating-independently. accompanied by family.   Johnny Freeman 72 y.o.  [place] of thrombocytopenia/mild anemia.  Admits to weight loss however intentional.  Denies any fatigue.  Denies any night sweats or fevers.  Prior known history of thrombocytopenia: none  Easy bruising/bleeding: Mild as patient on aspirin.  Medications: no new medications.   Alcohol: none; last beer 1984.   Antiplatelet therapy/blood thinners: asprin 81- 2 a day.   Hepatitis/HIV: none   Liver disease/CHF: none   Ultrasound/CT scan: none  Review of Systems  Constitutional:  Positive for weight loss. Negative for chills, diaphoresis, fever and malaise/fatigue.  HENT:  Negative for nosebleeds and sore throat.   Eyes:  Negative for double vision.  Respiratory:  Negative for cough, hemoptysis, sputum production, shortness of breath and wheezing.   Cardiovascular:  Negative for chest pain, palpitations, orthopnea and leg swelling.  Gastrointestinal:  Negative for abdominal pain, blood in stool, constipation, diarrhea, heartburn, melena, nausea and vomiting.  Genitourinary:  Negative for dysuria, frequency and urgency.  Musculoskeletal:  Negative for back pain and joint pain.  Skin: Negative.  Negative for itching and rash.  Neurological:  Negative for dizziness, tingling, focal weakness, weakness and headaches.  Endo/Heme/Allergies:  Does not bruise/bleed easily.  Psychiatric/Behavioral:  Negative for depression. The patient is not nervous/anxious and does not have insomnia.      MEDICAL HISTORY:  Past Medical History:  Diagnosis Date   Anxiety    Arthritis    hands   Cancer (HCC)     prostate 2011   Complication of anesthesia    Dyspnea    GERD (gastroesophageal reflux disease)    Grade I diastolic dysfunction    Headache    migraines   Hypertension    PE (pulmonary thromboembolism) (HCC) 2019   after knee surgery   PONV (postoperative nausea and vomiting)    nauseated after prostate bx HIFU   Prostate cancer [facility])     SURGICAL HISTORY: Past Surgical History:  Procedure Laterality Date   CATARACT EXTRACTION W/PHACO Right 02/25/2023   Procedure: CATARACT EXTRACTION PHACO AND INTRAOCULAR LENS PLACEMENT (IOC) RIGHT  7.23   00:38.2;  Surgeon: Galen Manila, MD;  Location: [facility];  Service: Ophthalmology;  Laterality: Right;   CATARACT EXTRACTION W/PHACO Left 03/11/2023   Procedure: CATARACT EXTRACTION PHACO AND INTRAOCULAR LENS PLACEMENT (IOC) LEFT 9.51 00:49.9;  Surgeon: Galen Manila, MD;  Location: [facility];  Service: Ophthalmology;  Laterality: Left;   CYSTOSCOPY N/A 11/04/2017   Procedure: CYSTOSCOPY;  Surgeon: Kennedy Bucker, MD;  Location: [facility] ORS;  Service: Orthopedics;  Laterality: N/A;   IVC FILTER INSERTION N/A 02/23/2019   Procedure: IVC FILTER INSERTION;  Surgeon: Annice Needy, MD;  Location: [facility] INVASIVE CV LAB;  Service: Cardiovascular;  Laterality: N/A;   IVC FILTER REMOVAL N/A 05/10/2019   Procedure: IVC FILTER REMOVAL;  Surgeon: Annice Needy, MD;  Location: [facility] INVASIVE CV LAB;  Service: Cardiovascular;  Laterality: N/A;   PROSTATE SURGERY     ROTATOR CUFF REPAIR Right 2003   TOTAL KNEE ARTHROPLASTY Left 11/04/2017   Procedure: TOTAL KNEE ARTHROPLASTY;  Surgeon: Kennedy Bucker, MD;  Location: [facility] ORS;  Service: Orthopedics;  Laterality: Left;   TOTAL KNEE ARTHROPLASTY Right 03/02/2019  Procedure: TOTAL KNEE ARTHROPLASTY RIGHT;  Surgeon: Kennedy Bucker, MD;  Location: [facility] ORS;  Service: Orthopedics;  Laterality: Right;    SOCIAL HISTORY: Social History   Socioeconomic History   Marital status: Married    Spouse name:  Not on file   Number of children: Not on file   Years of education: Not on file   Highest education level: Not on file  Occupational History   Not on file  Tobacco Use   Smoking status: Former    Current packs/day: 0.00    Types: Cigarettes    Quit date: 10/03/1972    Years since quitting: 50.6   Smokeless tobacco: Never  Vaping Use   Vaping status: Never Used  Substance and Sexual Activity   Alcohol use: No    Alcohol/week: 0.0 standard drinks of alcohol   Drug use: No   Sexual activity: Not on file  Other Topics Concern   Not on file  Social History Narrative   Not on file   Social Determinants of Health   Financial Resource Strain: Low Risk  (05/28/2023)   Received from [facility]   Overall Financial Resource Strain (CARDIA)    Difficulty of Paying Living Expenses: Not very hard  Food Insecurity: No Food Insecurity (05/28/2023)   Received from [facility]   Hunger Vital Sign    Worried About Running Out of Food in the Last Year: Never true    Ran Out of Food in the Last Year: Never true  Transportation Needs: No Transportation Needs (05/28/2023)   Received from [facility]    In the past 12 months, has lack of transportation kept you from medical appointments or from getting medications?: No    Lack of Transportation (Non-Medical): No  Physical Activity: Not on file  Stress: Not on file  Social Connections: Not on file  Intimate Partner Violence: Not on file    FAMILY HISTORY: Family History  Problem Relation Age of Onset   Heart attack Father    Breast cancer Sister     ALLERGIES:  is allergic to shellfish allergy.  MEDICATIONS:  Current Outpatient Medications  Medication Sig Dispense Refill   acetaminophen (TYLENOL) 500 MG tablet Take 500 mg by mouth 2 (two) times daily as needed (FOR PAIN.).     Ascorbic Acid (VITAMIN C) 1000 MG tablet Take 1,000 mg by mouth daily.      aspirin EC 81 MG tablet Take 81 mg by mouth daily. Takes 2 daily     atorvastatin (LIPITOR) 40 MG tablet      benazepril (LOTENSIN) 10 MG tablet Take 10 mg by mouth daily.     Biotin 1 MG CAPS Take by mouth.     Cholecalciferol (D2000 ULTRA STRENGTH) 50 MCG (2000 UT) CAPS Take by mouth.     diclofenac Sodium (VOLTAREN ARTHRITIS PAIN) 1 % GEL Apply topically as needed.     diltiazem (CARDIZEM CD) 180 MG 24 hr capsule Take 180 mg by mouth every morning.      docusate sodium (COLACE) 100 MG capsule Take 100 mg by mouth every other day.      Multiple Vitamin (MULTI-VITAMIN) tablet Take by mouth.     omeprazole (PRILOSEC) 20 MG capsule TAKE 1 CAPSULE EVERY DAY     Probiotic Product (PROBIOTIC-10 PO) Take 1 tablet by mouth daily.     triamcinolone cream (KENALOG) 0.1 % Apply 1 Application topically as needed.  No current facility-administered medications for this visit.    PHYSICAL EXAMINATION:  Vitals:   06/03/23 1054  BP: (!) 144/94  Pulse: 87  Temp: (!) 96.9 F (36.1 C)  SpO2: 99%   Filed Weights   06/03/23 1054  Weight: 206 lb 12.8 oz (93.8 kg)    Physical Exam Vitals and nursing note reviewed.  HENT:     Head: Normocephalic and atraumatic.     Mouth/Throat:     Pharynx: Oropharynx is clear.  Eyes:     Extraocular Movements: Extraocular movements intact.     Pupils: Pupils are equal, round, and reactive to light.  Cardiovascular:     Rate and Rhythm: Normal rate and regular rhythm.  Pulmonary:     Comments: Decreased breath sounds bilaterally.  Abdominal:     Palpations: Abdomen is soft.  Musculoskeletal:        General: Normal range of motion.     Cervical back: Normal range of motion.  Skin:    General: Skin is warm.  Neurological:     General: No focal deficit present.     Mental Status: He is alert and oriented to person, place, and time.  Psychiatric:        Behavior: Behavior normal.        Judgment: Judgment normal.      LABORATORY DATA:  I have  reviewed the data as listed Lab Results  Component Value Date   WBC 6.1 11/17/2019   HGB 14.6 11/17/2019   HCT 43.6 11/17/2019   MCV 87 11/17/2019   PLT 225 11/17/2019   No results for input(s): "NA", "K", "CL", "CO2", "GLUCOSE", "BUN", "CREATININE", "CALCIUM", "GFRNONAA", "GFRAA", "PROT", "ALBUMIN", "AST", "ALT", "ALKPHOS", "BILITOT", "BILIDIR", "IBILI" in the last 8760 hours.  RADIOGRAPHIC STUDIES: I have personally reviewed the radiological images as listed and agreed with the findings in the report. No results found.  Thrombocytopenia (HCC) # Thrombocytopenia: Mild AUG 2024-117.  However slowly trending down over the last 1-2 years.  Along with mild anemia the etiology is unclear.   # I had a long discussion with patient regarding multiple etiologies including but not limited to liver disease/medications/autoimmune disease-ITP etc.  Less likely any malignant causes.  Discussed that ITP is a diagnosis of exclusion.    # Recommend further work-up including-CBC CMP LDH; review of peripheral smear; HIV/hepatitis work-up.  Ultrasound abdomen-to rule out cirrhosis/splenomegaly. Discussed the role of a bone marrow biopsy for further evaluation of hematologic disorder.  However hold off pending above work-up.   # 2019 Acute PE [-provoked after surgery]-  Positive for acute pulmonary emboli within distal left lower lobe pulmonary artery with extension of thrombus into multiple segmental and subsegmental branches of the left lower lobe. S/p IVC filter-currently off anticaogulation. Monitor for now.   Thank you Dr.Johnston for allowing me to participate in the care of your pleasant patient. Please do not hesitate to contact me with questions or concerns in the interim.  # DISPOSITION: # labs today- CBC CMP LDH; review of peripheral smear; HIV/hepatitis work-up;AFP- US abdomen # follow up in 2 weeks- MD; no labs- Dr.B  All questions were answered. The patient knows to call the clinic with any  problems, questions or concerns.    Earna Coder, MD 06/03/2023 12:16 PM

## 2023-06-10 ENCOUNTER — Ambulatory Visit
Admission: RE | Admit: 2023-06-10 | Discharge: 2023-06-10 | Disposition: A | Discharge status: Home / Self Care | Payer: Medicare HMO | Source: Ambulatory Visit | Attending: Internal Medicine | Admitting: Internal Medicine

## 2023-06-10 DIAGNOSIS — D696 Thrombocytopenia, unspecified: Secondary | ICD-10-CM

## 2023-06-16 ENCOUNTER — Inpatient Hospital Stay: Payer: Medicare HMO | Admitting: Internal Medicine

## 2023-06-16 ENCOUNTER — Encounter: Payer: Self-pay | Admitting: Internal Medicine

## 2023-06-16 VITALS — BP 134/81 | HR 90 | Temp 98.0°F | Ht 67.0 in | Wt 205.2 lb

## 2023-06-16 DIAGNOSIS — D696 Thrombocytopenia, unspecified: Secondary | ICD-10-CM

## 2023-06-16 NOTE — Progress Notes (Signed)
[facility] CONSULT NOTE  Patient Care Team: Gracelyn Nurse, MD as PCP - General (Internal Medicine) Kennedy Bucker, MD as Consulting Physician (Orthopedic Surgery) Earna Coder, MD as Consulting Physician (Oncology)  CHIEF COMPLAINTS/PURPOSE OF CONSULTATION: Thrombocytopenia  HISTORY OF PRESENTING ILLNESS: Patient ambulating-independently. accompanied by family.   Johnny Freeman 72 y.o.  male patient is here to review the results of his workup for thrombocytopenia/mild anemia.  Patient has chronic easy bruising on blood thinners.  Otherwise no bleeding gums.   Had an U/S abdomen and labs since last visit.   Review of Systems  Constitutional:  Positive for weight loss. Negative for chills, diaphoresis, fever and malaise/fatigue.  HENT:  Negative for nosebleeds and sore throat.   Eyes:  Negative for double vision.  Respiratory:  Negative for cough, hemoptysis, sputum production, shortness of breath and wheezing.   Cardiovascular:  Negative for chest pain, palpitations, orthopnea and leg swelling.  Gastrointestinal:  Negative for abdominal pain, blood in stool, constipation, diarrhea, heartburn, melena, nausea and vomiting.  Genitourinary:  Negative for dysuria, frequency and urgency.  Musculoskeletal:  Negative for back pain and joint pain.  Skin: Negative.  Negative for itching and rash.  Neurological:  Negative for dizziness, tingling, focal weakness, weakness and headaches.  Endo/Heme/Allergies:  Does not bruise/bleed easily.  Psychiatric/Behavioral:  Negative for depression. The patient is not nervous/anxious and does not have insomnia.      MEDICAL HISTORY:  Past Medical History:  Diagnosis Date   Anxiety    Arthritis    hands   Cancer (HCC)    prostate 2011   Complication of anesthesia    Dyspnea    GERD (gastroesophageal reflux disease)    Grade I diastolic dysfunction    Headache    migraines   Hypertension    PE (pulmonary  thromboembolism) (HCC) 2019   after knee surgery   PONV (postoperative nausea and vomiting)    nauseated after prostate bx HIFU   Prostate cancer [facility])     SURGICAL HISTORY: Past Surgical History:  Procedure Laterality Date   CATARACT EXTRACTION W/PHACO Right 02/25/2023   Procedure: CATARACT EXTRACTION PHACO AND INTRAOCULAR LENS PLACEMENT (IOC) RIGHT  7.23   00:38.2;  Surgeon: Galen Manila, MD;  Location: [facility];  Service: Ophthalmology;  Laterality: Right;   CATARACT EXTRACTION W/PHACO Left 03/11/2023   Procedure: CATARACT EXTRACTION PHACO AND INTRAOCULAR LENS PLACEMENT (IOC) LEFT 9.51 00:49.9;  Surgeon: Galen Manila, MD;  Location: [facility];  Service: Ophthalmology;  Laterality: Left;   CYSTOSCOPY N/A 11/04/2017   Procedure: CYSTOSCOPY;  Surgeon: Kennedy Bucker, MD;  Location: [facility] ORS;  Service: Orthopedics;  Laterality: N/A;   IVC FILTER INSERTION N/A 02/23/2019   Procedure: IVC FILTER INSERTION;  Surgeon: Annice Needy, MD;  Location: [facility] INVASIVE CV LAB;  Service: Cardiovascular;  Laterality: N/A;   IVC FILTER REMOVAL N/A 05/10/2019   Procedure: IVC FILTER REMOVAL;  Surgeon: Annice Needy, MD;  Location: [facility] INVASIVE CV LAB;  Service: Cardiovascular;  Laterality: N/A;   PROSTATE SURGERY     ROTATOR CUFF REPAIR Right 2003   TOTAL KNEE ARTHROPLASTY Left 11/04/2017   Procedure: TOTAL KNEE ARTHROPLASTY;  Surgeon: Kennedy Bucker, MD;  Location: [facility] ORS;  Service: Orthopedics;  Laterality: Left;   TOTAL KNEE ARTHROPLASTY Right 03/02/2019   Procedure: TOTAL KNEE ARTHROPLASTY RIGHT;  Surgeon: Kennedy Bucker, MD;  Location: [facility] ORS;  Service: Orthopedics;  Laterality: Right;    SOCIAL HISTORY: Social History   Socioeconomic  History   Marital status: Married    Spouse name: Not on file   Number of children: Not on file   Years of education: Not on file   Highest education level: Not on file  Occupational History   Not on file  Tobacco Use   Smoking status:  Former    Current packs/day: 0.00    Types: Cigarettes    Quit date: 10/03/1972    Years since quitting: 50.7   Smokeless tobacco: Never  Vaping Use   Vaping status: Never Used  Substance and Sexual Activity   Alcohol use: No    Alcohol/week: 0.0 standard drinks of alcohol   Drug use: No   Sexual activity: Not on file  Other Topics Concern   Not on file  Social History Narrative   Not on file   Social Determinants of Health   Financial Resource Strain: Low Risk  (05/28/2023)   Received from [facility]   Overall Financial Resource Strain (CARDIA)    Difficulty of Paying Living Expenses: Not very hard  Food Insecurity: No Food Insecurity (05/28/2023)   Received from [facility]   Hunger Vital Sign    Worried About Running Out of Food in the Last Year: Never true    Ran Out of Food in the Last Year: Never true  Transportation Needs: No Transportation Needs (05/28/2023)   Received from [facility] - [facility]    In the past 12 months, has lack of transportation kept you from medical appointments or from getting medications?: No    Lack of Transportation (Non-Medical): No  Physical Activity: Not on file  Stress: Not on file  Social Connections: Not on file  Intimate Partner Violence: Not on file    FAMILY HISTORY: Family History  Problem Relation Age of Onset   Heart attack Father    Breast cancer Sister     ALLERGIES:  is allergic to shellfish allergy.  MEDICATIONS:  Current Outpatient Medications  Medication Sig Dispense Refill   acetaminophen (TYLENOL) 500 MG tablet Take 500 mg by mouth 2 (two) times daily as needed (FOR PAIN.).     Ascorbic Acid (VITAMIN C) 1000 MG tablet Take 1,000 mg by mouth daily.     aspirin EC 81 MG tablet Take 81 mg by mouth daily. Takes 2 daily     atorvastatin (LIPITOR) 40 MG tablet      benazepril (LOTENSIN) 10 MG tablet Take 10 mg by mouth daily.     Biotin 1 MG CAPS  Take by mouth.     Cholecalciferol (D2000 ULTRA STRENGTH) 50 MCG (2000 UT) CAPS Take by mouth.     diclofenac Sodium (VOLTAREN ARTHRITIS PAIN) 1 % GEL Apply topically as needed.     diltiazem (CARDIZEM CD) 180 MG 24 hr capsule Take 180 mg by mouth every morning.      docusate sodium (COLACE) 100 MG capsule Take 100 mg by mouth every other day.      Multiple Vitamin (MULTI-VITAMIN) tablet Take by mouth.     omeprazole (PRILOSEC) 20 MG capsule TAKE 1 CAPSULE EVERY DAY     Probiotic Product (PROBIOTIC-10 PO) Take 1 tablet by mouth daily.     triamcinolone cream (KENALOG) 0.1 % Apply 1 Application topically as needed.     No current facility-administered medications for this visit.    PHYSICAL EXAMINATION:  Vitals:   06/16/23 1425  BP: 134/81  Pulse: 90  Temp: 98 F (  36.7 C)  SpO2: 98%   Filed Weights   06/16/23 1425  Weight: 205 lb 3.2 oz (93.1 kg)    Physical Exam Vitals and nursing note reviewed.  HENT:     Head: Normocephalic and atraumatic.     Mouth/Throat:     Pharynx: Oropharynx is clear.  Eyes:     Extraocular Movements: Extraocular movements intact.     Pupils: Pupils are equal, round, and reactive to light.  Cardiovascular:     Rate and Rhythm: Normal rate and regular rhythm.  Pulmonary:     Comments: Decreased breath sounds bilaterally.  Abdominal:     Palpations: Abdomen is soft.  Musculoskeletal:        General: Normal range of motion.     Cervical back: Normal range of motion.  Skin:    General: Skin is warm.  Neurological:     General: No focal deficit present.     Mental Status: He is alert and oriented to person, place, and time.  Psychiatric:        Behavior: Behavior normal.        Judgment: Judgment normal.      LABORATORY DATA:  I have reviewed the data as listed Lab Results  Component Value Date   WBC 4.8 06/03/2023   HGB 13.4 06/03/2023   HCT 39.2 06/03/2023   MCV 92.0 06/03/2023   PLT 124 (L) 06/03/2023   Recent Labs     06/03/23 1205  NA 131*  K 3.9  CL 98  CO2 24  GLUCOSE 102*  BUN 11  CREATININE 0.77  CALCIUM 8.8*  GFRNONAA >60  PROT 7.4  ALBUMIN 4.3  AST 29  ALT 24  ALKPHOS 68  BILITOT 0.5    RADIOGRAPHIC STUDIES: I have personally reviewed the radiological images as listed and agreed with the findings in the report. US Abdomen Complete  Result Date: 06/13/2023 CLINICAL DATA:  Cirrhosis splenomegaly thrombocytopenia EXAM: ABDOMEN ULTRASOUND COMPLETE COMPARISON:  None Available. FINDINGS: Gallbladder: Gallstone. Small amount of adenomyomatosis. Negative sonographic Murphy. Common bile duct: Diameter: 4.2 mm Liver: Hepatic echogenicity within normal limits. Hepatic cysts measuring up to 16 mm at the inferior right hepatic lobe, no specific imaging follow-up is recommended. Portal vein is patent on color Doppler imaging with normal direction of blood flow towards the liver. IVC: No abnormality visualized. Pancreas: Visualized portion unremarkable. Spleen: Size and appearance within normal limits. Right Kidney: Length: 11.1 cm. Echogenicity within normal limits. No mass or hydronephrosis visualized. Left Kidney: Length: 11.7 cm. Echogenicity within normal limits. No mass or hydronephrosis visualized. Abdominal aorta: No aneurysm visualized. Other findings: None. IMPRESSION: 1. Cholelithiasis. 2. Hepatic cysts. 3. Spleen is within normal limits. Electronically Signed   By: Jasmine Pang M.D.   On: 06/13/2023 15:56    Thrombocytopenia (HCC) # Thrombocytopenia: Mild AUG 2024-117.  However slowly trending down over the last 1-2 years.  SEP 2024-hemoglobin normal platelets 124. SEP 2024-US abdomen-Cholelithiasis; Hepatic cysts; Spleen is within normal limits. 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 Discussed the role of a bone marrow biopsy for further evaluation of hematologic disorder.  However hold off pending above work-up  # I discussed the natural history of ITP; including the  unpredictability of relapse.  Also discussed at length symptomatology of low platelets including petechial rash/gum bleeding nosebleeds.  Discussed multiple treatment options including but not limited to steroids; thrombopoietin stimulating agents; and splenectomy in refractory situations.  I discussed that with the current level of platelets would not  recommend any treatment.  Continue surveillance.  Patient agreement.  # 2019 Acute PE [-provoked after surgery]-  Positive for acute pulmonary emboli within distal left lower lobe pulmonary artery with extension of thrombus into multiple segmental and subsegmental branches of the left lower lobe. S/p IVC filter-currently off anticaogulation- on asprin 81x2 per day.   # DISPOSITION: # follow up in 3 months-  MD;  labs- CBC CMP  Dr.B  All questions were answered. The patient knows to call the clinic with any problems, questions or concerns.    Earna Coder, MD 06/16/2023 3:52 PM

## 2023-06-16 NOTE — Progress Notes (Signed)
Bruising: yes, on blood thinners Bleeding from gums: no  Had an U/S abdomen and labs since last visit.

## 2023-06-16 NOTE — Patient Instructions (Signed)
Discussed regarding low calcium. Recommend calcium 1200 plus vitamin D-3 1000-over-the-counter-1 pill a day.

## 2023-06-16 NOTE — Assessment & Plan Note (Addendum)
#   Thrombocytopenia: Mild AUG 2024-117.  However slowly trending down over the last 1-2 years.  SEP 2024-hemoglobin normal platelets 124. SEP 2024-US abdomen-Cholelithiasis; Hepatic cysts; Spleen is within normal limits. 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 Discussed the role of a bone marrow biopsy for further evaluation of hematologic disorder.  However hold off pending above work-up  # I discussed the natural history of ITP; including the unpredictability of relapse.  Also discussed at length symptomatology of low platelets including petechial rash/gum bleeding nosebleeds.  Discussed multiple treatment options including but not limited to steroids; thrombopoietin stimulating agents; and splenectomy in refractory situations.  I discussed that with the current level of platelets would not recommend any treatment.  Continue surveillance.  Patient agreement.  # 2019 Acute PE [-provoked after surgery]-  Positive for acute pulmonary emboli within distal left lower lobe pulmonary artery with extension of thrombus into multiple segmental and subsegmental branches of the left lower lobe. S/p IVC filter-currently off anticaogulation- on asprin 81x2 per day.   # DISPOSITION: # follow up in 3 months-  MD;  labs- CBC CMP  Dr.B

## 2023-08-06 ENCOUNTER — Ambulatory Visit: Payer: Medicare HMO | Admitting: Internal Medicine

## 2023-08-06 ENCOUNTER — Other Ambulatory Visit: Payer: Medicare HMO

## 2023-08-25 ENCOUNTER — Ambulatory Visit: Payer: Medicare HMO

## 2023-08-25 DIAGNOSIS — Z1211 Encounter for screening for malignant neoplasm of colon: Secondary | ICD-10-CM | POA: Diagnosis present

## 2023-08-25 DIAGNOSIS — K640 First degree hemorrhoids: Secondary | ICD-10-CM | POA: Diagnosis not present

## 2023-08-25 DIAGNOSIS — K317 Polyp of stomach and duodenum: Secondary | ICD-10-CM | POA: Diagnosis not present

## 2023-08-25 DIAGNOSIS — D123 Benign neoplasm of transverse colon: Secondary | ICD-10-CM | POA: Diagnosis not present

## 2023-09-10 ENCOUNTER — Inpatient Hospital Stay: Payer: Medicare HMO | Attending: Internal Medicine

## 2023-09-10 DIAGNOSIS — D696 Thrombocytopenia, unspecified: Secondary | ICD-10-CM | POA: Diagnosis present

## 2023-09-10 DIAGNOSIS — Z86711 Personal history of pulmonary embolism: Secondary | ICD-10-CM | POA: Insufficient documentation

## 2023-09-10 DIAGNOSIS — Z79899 Other long term (current) drug therapy: Secondary | ICD-10-CM | POA: Diagnosis not present

## 2023-09-10 DIAGNOSIS — Z87891 Personal history of nicotine dependence: Secondary | ICD-10-CM | POA: Insufficient documentation

## 2023-09-10 DIAGNOSIS — D649 Anemia, unspecified: Secondary | ICD-10-CM | POA: Insufficient documentation

## 2023-09-10 DIAGNOSIS — Z7982 Long term (current) use of aspirin: Secondary | ICD-10-CM | POA: Insufficient documentation

## 2023-09-10 LAB — CMP (CANCER CENTER ONLY)
ALT: 22 U/L (ref 0–44)
AST: 29 U/L (ref 15–41)
Albumin: 4.2 g/dL (ref 3.5–5.0)
Alkaline Phosphatase: 65 U/L (ref 38–126)
Anion gap: 8 (ref 5–15)
BUN: 10 mg/dL (ref 8–23)
CO2: 27 mmol/L (ref 22–32)
Calcium: 8.9 mg/dL (ref 8.9–10.3)
Chloride: 98 mmol/L (ref 98–111)
Creatinine: 0.98 mg/dL (ref 0.61–1.24)
GFR, Estimated: >60 mL/min
Glucose, Bld: 86 mg/dL (ref 70–99)
Potassium: 4.2 mmol/L (ref 3.5–5.1)
Sodium: 133 mmol/L — ABNORMAL LOW (ref 135–145)
Total Bilirubin: 0.7 mg/dL
Total Protein: 7.3 g/dL (ref 6.5–8.1)

## 2023-09-10 LAB — CBC WITH DIFFERENTIAL (CANCER CENTER ONLY)
Abs Immature Granulocytes: 0.05 K/uL (ref 0.00–0.07)
Basophils Absolute: 0 K/uL (ref 0.0–0.1)
Basophils Relative: 1 %
Eosinophils Absolute: 0.1 K/uL (ref 0.0–0.5)
Eosinophils Relative: 1 %
HCT: 37.5 % — ABNORMAL LOW (ref 39.0–52.0)
Hemoglobin: 12.7 g/dL — ABNORMAL LOW (ref 13.0–17.0)
Immature Granulocytes: 1 %
Lymphocytes Relative: 32 %
Lymphs Abs: 1.7 K/uL (ref 0.7–4.0)
MCH: 31.1 pg (ref 26.0–34.0)
MCHC: 33.9 g/dL (ref 30.0–36.0)
MCV: 91.9 fL (ref 80.0–100.0)
Monocytes Absolute: 1.4 K/uL — ABNORMAL HIGH (ref 0.1–1.0)
Monocytes Relative: 27 %
Neutro Abs: 2.1 K/uL (ref 1.7–7.7)
Neutrophils Relative %: 38 %
Platelet Count: 141 K/uL — ABNORMAL LOW (ref 150–400)
RBC: 4.08 MIL/uL — ABNORMAL LOW (ref 4.22–5.81)
RDW: 14.6 % (ref 11.5–15.5)
WBC Count: 5.2 K/uL (ref 4.0–10.5)
nRBC: 0 % (ref 0.0–0.2)

## 2023-09-11 ENCOUNTER — Other Ambulatory Visit: Payer: Self-pay | Admitting: *Deleted

## 2023-09-11 DIAGNOSIS — D696 Thrombocytopenia, unspecified: Secondary | ICD-10-CM

## 2023-09-15 ENCOUNTER — Encounter: Payer: Self-pay | Admitting: Internal Medicine

## 2023-09-15 ENCOUNTER — Inpatient Hospital Stay (HOSPITAL_BASED_OUTPATIENT_CLINIC_OR_DEPARTMENT_OTHER): Payer: Medicare HMO | Admitting: Internal Medicine

## 2023-09-15 VITALS — BP 136/79 | HR 89 | Temp 97.1°F | Ht 67.0 in | Wt 204.2 lb

## 2023-09-15 DIAGNOSIS — D696 Thrombocytopenia, unspecified: Secondary | ICD-10-CM

## 2023-09-15 NOTE — Assessment & Plan Note (Signed)
#   Thrombocytopenia: Mild AUG 2024-117.  However slowly trending down over the last 1-2 years.  SEP 2024-hemoglobin normal platelets 124. SEP 2024-US abdomen-Cholelithiasis; Hepatic cysts; Spleen is within normal limits. 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   # Today platelets 141; Hb 12.2. Discussed the role of a bone marrow biopsy for further evaluation of hematologic disorder.  However hold off pending above work-up. EGD/[place]- NOV 2024 [Dr.Toledo]- no acute process.   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  Also discussed at length symptomatology of low platelets including petechial rash/gum bleeding nosebleeds.  Discussed multiple treatment options including but not limited to steroids; thrombopoietin stimulating agents; and splenectomy in refractory situations.  I discussed that with the current level of platelets would not recommend any treatment. Continue surveillance.  Patient agreement.  # 2019 Acute PE [-provoked after surgery]-  Positive for acute pulmonary emboli within distal left lower lobe pulmonary artery with extension of thrombus into multiple segmental and subsegmental branches of the left lower lobe. S/p IVC filter-currently off anticaogulation- on asprin 81x2 per day.   # DISPOSITION: # follow up in 6 months-  MD;  labs- CBC CMP  Dr.B

## 2023-09-15 NOTE — Progress Notes (Signed)
Bruising: no Bleeding from gums: no  Pt had a colonoscopy/endo 08/25/23.  Had covid 06/17/23.

## 2023-09-15 NOTE — Progress Notes (Signed)
[facility] CONSULT NOTE  Patient Care Team: Gracelyn Nurse, MD as PCP - General (Internal Medicine) Kennedy Bucker, MD as Consulting Physician (Orthopedic Surgery) Earna Coder, MD as Consulting Physician (Oncology)  CHIEF COMPLAINTS/PURPOSE OF CONSULTATION: Thrombocytopenia  HISTORY OF PRESENTING ILLNESS: Patient ambulating-independently. accompanied by family.   Johnny Freeman 72 y.o.  male patient is here to review the results of his workup for thrombocytopenia/mild anemia.  Patient denies any easy bruising or bleeding. In the interim patient had endoscopy and a colonoscopy November 2024.  Patient has chronic easy bruising on asprin. Otherwise no bleeding gums.   Review of Systems  Constitutional:  Positive for weight loss. Negative for chills, diaphoresis, fever and malaise/fatigue.  HENT:  Negative for nosebleeds and sore throat.   Eyes:  Negative for double vision.  Respiratory:  Negative for cough, hemoptysis, sputum production, shortness of breath and wheezing.   Cardiovascular:  Negative for chest pain, palpitations, orthopnea and leg swelling.  Gastrointestinal:  Negative for abdominal pain, blood in stool, constipation, diarrhea, heartburn, melena, nausea and vomiting.  Genitourinary:  Negative for dysuria, frequency and urgency.  Musculoskeletal:  Negative for back pain and joint pain.  Skin: Negative.  Negative for itching and rash.  Neurological:  Negative for dizziness, tingling, focal weakness, weakness and headaches.  Endo/Heme/Allergies:  Does not bruise/bleed easily.  Psychiatric/Behavioral:  Negative for depression. The patient is not nervous/anxious and does not have insomnia.      MEDICAL HISTORY:  Past Medical History:  Diagnosis Date   Anxiety    Arthritis    hands   Cancer (HCC)    prostate 2011   Complication of anesthesia    Dyspnea    GERD (gastroesophageal reflux disease)    Grade I diastolic dysfunction    Headache     migraines   Hypertension    PE (pulmonary thromboembolism) (HCC) 2019   after knee surgery   PONV (postoperative nausea and vomiting)    nauseated after prostate bx HIFU   Prostate cancer [facility])     SURGICAL HISTORY: Past Surgical History:  Procedure Laterality Date   CATARACT EXTRACTION W/PHACO Right 02/25/2023   Procedure: CATARACT EXTRACTION PHACO AND INTRAOCULAR LENS PLACEMENT (IOC) RIGHT  7.23   00:38.2;  Surgeon: Galen Manila, MD;  Location: [facility];  Service: Ophthalmology;  Laterality: Right;   CATARACT EXTRACTION W/PHACO Left 03/11/2023   Procedure: CATARACT EXTRACTION PHACO AND INTRAOCULAR LENS PLACEMENT (IOC) LEFT 9.51 00:49.9;  Surgeon: Galen Manila, MD;  Location: [facility];  Service: Ophthalmology;  Laterality: Left;   CYSTOSCOPY N/A 11/04/2017   Procedure: CYSTOSCOPY;  Surgeon: Kennedy Bucker, MD;  Location: [facility] ORS;  Service: Orthopedics;  Laterality: N/A;   IVC FILTER INSERTION N/A 02/23/2019   Procedure: IVC FILTER INSERTION;  Surgeon: Annice Needy, MD;  Location: [facility] INVASIVE CV LAB;  Service: Cardiovascular;  Laterality: N/A;   IVC FILTER REMOVAL N/A 05/10/2019   Procedure: IVC FILTER REMOVAL;  Surgeon: Annice Needy, MD;  Location: [facility] INVASIVE CV LAB;  Service: Cardiovascular;  Laterality: N/A;   PROSTATE SURGERY     ROTATOR CUFF REPAIR Right 2003   TOTAL KNEE ARTHROPLASTY Left 11/04/2017   Procedure: TOTAL KNEE ARTHROPLASTY;  Surgeon: Kennedy Bucker, MD;  Location: [facility] OR[facility];  Service: Orthopedics;  Laterality: Left;   TOTAL KNEE ARTHROPLASTY Right 03/02/2019   Procedure: TOTAL KNEE ARTHROPLASTY RIGHT;  Surgeon: Kennedy Bucker, MD;  Location: [facility] ORS;  Service: Orthopedics;  Laterality: Right;    SOCIAL  HISTORY: Social History   Socioeconomic History   Marital status: Married    Spouse name: Not on file   Number of children: Not on file   Years of education: Not on file   Highest education level: Not on file  Occupational History    Not on file  Tobacco Use   Smoking status: Former    Current packs/day: 0.00    Types: Cigarettes    Quit date: 10/03/1972    Years since quitting: 50.9   Smokeless tobacco: Never  Vaping Use   Vaping status: Never Used  Substance and Sexual Activity   Alcohol use: No    Alcohol/week: 0.0 standard drinks of alcohol   Drug use: No   Sexual activity: Not on file  Other Topics Concern   Not on file  Social History Narrative   Not on file   Social Drivers of Health   Financial Resource Strain: Low Risk  (05/28/2023)   Received from [facility]   Overall Financial Resource Strain (CARDIA)    Difficulty of Paying Living Expenses: Not very hard  Food Insecurity: No Food Insecurity (05/28/2023)   Received from [facility]   Hunger Vital Sign    Worried About Running Out of Food in the Last Year: Never true    Ran Out of Food in the Last Year: Never true  Transportation Needs: No Transportation Needs (05/28/2023)   Received from [facility] - Transportation    In the past 12 months, has lack of transportation kept you from medical appointments or from getting medications?: No    Lack of Transportation (Non-Medical): No  Physical Activity: Not on file  Stress: Not on file  Social Connections: Not on file  Intimate Partner Violence: Not on file    FAMILY HISTORY: Family History  Problem Relation Age of Onset   Heart attack Father    Breast cancer Sister     ALLERGIES:  is allergic to shellfish allergy.  MEDICATIONS:  Current Outpatient Medications  Medication Sig Dispense Refill   acetaminophen (TYLENOL) 500 MG tablet Take 500 mg by mouth 2 (two) times daily as needed (FOR PAIN.).     Ascorbic Acid (VITAMIN C) 1000 MG tablet Take 1,000 mg by mouth daily.     aspirin EC 81 MG tablet Take 81 mg by mouth daily. Takes 2 daily     atorvastatin (LIPITOR) 40 MG tablet      benazepril (LOTENSIN) 10 MG tablet Take 10 mg  by mouth daily.     Biotin 1 MG CAPS Take by mouth.     Cholecalciferol (D2000 ULTRA STRENGTH) 50 MCG (2000 UT) CAPS Take by mouth.     diclofenac Sodium (VOLTAREN ARTHRITIS PAIN) 1 % GEL Apply topically as needed.     diltiazem (CARDIZEM CD) 180 MG 24 hr capsule Take 180 mg by mouth every morning.      docusate sodium (COLACE) 100 MG capsule Take 100 mg by mouth every other day.      Multiple Vitamin (MULTI-VITAMIN) tablet Take by mouth.     omeprazole (PRILOSEC) 20 MG capsule TAKE 1 CAPSULE EVERY DAY     Probiotic Product (PROBIOTIC-10 PO) Take 1 tablet by mouth daily.     triamcinolone cream (KENALOG) 0.1 % Apply 1 Application topically as needed.     No current facility-administered medications for this visit.    PHYSICAL EXAMINATION:  Vitals:   09/15/23 1022  BP: 136/79  Pulse: 89  Temp: (!) 97.1 F (36.2 C)  SpO2: 100%   Filed Weights   09/15/23 1022  Weight: 204 lb 3.2 oz (92.6 kg)    Physical Exam Vitals and nursing note reviewed.  HENT:     Head: Normocephalic and atraumatic.     Mouth/Throat:     Pharynx: Oropharynx is clear.  Eyes:     Extraocular Movements: Extraocular movements intact.     Pupils: Pupils are equal, round, and reactive to light.  Cardiovascular:     Rate and Rhythm: Normal rate and regular rhythm.  Pulmonary:     Comments: Decreased breath sounds bilaterally.  Abdominal:     Palpations: Abdomen is soft.  Musculoskeletal:        General: Normal range of motion.     Cervical back: Normal range of motion.  Skin:    General: Skin is warm.  Neurological:     General: No focal deficit present.     Mental Status: He is alert and oriented to person, place, and time.  Psychiatric:        Behavior: Behavior normal.        Judgment: Judgment normal.      LABORATORY DATA:  I have reviewed the data as listed Lab Results  Component Value Date   WBC 5.2 09/10/2023   HGB 12.7 (L) 09/10/2023   HCT 37.5 (L) 09/10/2023   MCV 91.9 09/10/2023    PLT 141 (L) 09/10/2023   Recent Labs    06/03/23 1205 09/10/23 1017  NA 131* 133*  K 3.9 4.2  CL 98 98  CO2 24 27  GLUCOSE 102* 86  BUN 11 10  CREATININE 0.77 0.98  CALCIUM 8.8* 8.9  GFRNONAA >60 >60  PROT 7.4 7.3  ALBUMIN 4.3 4.2  AST 29 29  ALT 24 22  ALKPHOS 68 65  BILITOT 0.5 0.7    RADIOGRAPHIC STUDIES: I have personally reviewed the radiological images as listed and agreed with the findings in the report. No results found.  Thrombocytopenia (HCC) # Thrombocytopenia: Mild AUG 2024-117.  However slowly trending down over the last 1-2 years.  SEP 2024-hemoglobin normal platelets 124. SEP 2024-US abdomen-Cholelithiasis; Hepatic cysts; Spleen is within normal limits. 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   # Today platelets 141; Hb 12.2. Discussed the role of a bone marrow biopsy for further evaluation of hematologic disorder.  However hold off pending above work-up. EGD/[place]- NOV 2024 [Dr.Toledo]- no acute process.   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  Also discussed at length symptomatology of low platelets including petechial rash/gum bleeding nosebleeds.  Discussed multiple treatment options including but not limited to steroids; thrombopoietin stimulating agents; and splenectomy in refractory situations.  I discussed that with the current level of platelets would not recommend any treatment. Continue surveillance.  Patient agreement.  # 2019 Acute PE [-provoked after surgery]-  Positive for acute pulmonary emboli within distal left lower lobe pulmonary artery with extension of thrombus into multiple segmental and subsegmental branches of the left lower lobe. S/p IVC filter-currently off anticaogulation- on asprin 81x2 per day.   # DISPOSITION: # follow up in 6 months-  MD;  labs- CBC CMP  Dr.B   All questions were answered. The patient knows to call the clinic with any problems, questions or  concerns.    Earna Coder, MD 09/15/2023 10:58 AM

## 2023-11-27 ENCOUNTER — Telehealth: Payer: Self-pay | Admitting: *Deleted

## 2023-11-27 NOTE — Telephone Encounter (Signed)
 He wants to see the numbers from [facility] if anything good or bad forom hs per [employer]. Cell phone [phone]

## 2024-01-26 ENCOUNTER — Other Ambulatory Visit: Payer: Self-pay | Admitting: *Deleted

## 2024-01-26 DIAGNOSIS — C61 Malignant neoplasm of prostate: Secondary | ICD-10-CM

## 2024-01-28 ENCOUNTER — Other Ambulatory Visit: Payer: Self-pay

## 2024-01-28 DIAGNOSIS — C61 Malignant neoplasm of prostate: Secondary | ICD-10-CM

## 2024-01-29 LAB — PSA: Prostate Specific Ag, Serum: 0.4 ng/mL (ref 0.0–4.0)

## 2024-01-30 ENCOUNTER — Ambulatory Visit: Payer: Self-pay | Admitting: Urology

## 2024-01-30 ENCOUNTER — Encounter: Payer: Self-pay | Admitting: Urology

## 2024-01-30 VITALS — BP 124/93 | HR 87 | Ht 68.0 in | Wt 200.0 lb

## 2024-01-30 DIAGNOSIS — C61 Malignant neoplasm of prostate: Secondary | ICD-10-CM

## 2024-01-30 NOTE — Progress Notes (Signed)
 I, Johnny Freeman, acting as a scribe for Johnny Knapp, MD., have documented all relevant documentation on the behalf of Johnny Knapp, MD, as directed by Johnny Knapp, MD while in the presence of Johnny Knapp, MD.  01/30/2024 4:24 PM   Johnny Freeman 1950/10/16 [id number]  Referring provider: Little Riff, MD [address] [facility] [place],  [place] [address]  Chief Complaint  Patient presents with   Prostate Cancer   Urologic history 1. 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  HPI: Johnny Freeman is a 73 y.o. male presents for annual follow-up.  No problems since last visit. He has no bothersome lower urinary tract symptoms. His PSA is normal as slowly increased. It was 0.2 in 2023, 0.3 last year, and his most recent PSA 01/28/24 was 0.4  PSA trend   Prostate Specific Ag, Serum  Latest Ref Rng 0.0 - 4.0 ng/mL  11/17/2019 0.2   01/30/2023 0.3   01/28/2024 0.4      PMH: Past Medical History:  Diagnosis Date   Anxiety    Arthritis    hands   Cancer (HCC)    prostate 2011   Complication of anesthesia    Dyspnea    GERD (gastroesophageal reflux disease)    Grade I diastolic dysfunction    Headache    migraines   Hypertension    PE (pulmonary thromboembolism) (HCC) 2019   after knee surgery   PONV (postoperative nausea and vomiting)    nauseated after prostate bx HIFU   Prostate cancer [facility])     Surgical History: Past Surgical History:  Procedure Laterality Date   CATARACT EXTRACTION W/PHACO Right 02/25/2023   Procedure: CATARACT EXTRACTION PHACO AND INTRAOCULAR LENS PLACEMENT (IOC) RIGHT  7.23   00:38.2;  Surgeon: Clair Crews, MD;  Location: [facility];  Service: Ophthalmology;  Laterality: Right;   CATARACT EXTRACTION W/PHACO Left 03/11/2023   Procedure: CATARACT EXTRACTION PHACO AND INTRAOCULAR LENS PLACEMENT (IOC) LEFT 9.51 00:49.9;  Surgeon:  Clair Crews, MD;  Location: [facility];  Service: Ophthalmology;  Laterality: Left;   CYSTOSCOPY N/A 11/04/2017   Procedure: CYSTOSCOPY;  Surgeon: Molli Angelucci, MD;  Location: [facility] OR[facility];  Service: Orthopedics;  Laterality: N/A;   IVC FILTER INSERTION N/A 02/23/2019   Procedure: IVC FILTER INSERTION;  Surgeon: Celso College, MD;  Location: [facility] INVASIVE CV LAB;  Service: Cardiovascular;  Laterality: N/A;   IVC FILTER REMOVAL N/A 05/10/2019   Procedure: IVC FILTER REMOVAL;  Surgeon: Celso College, MD;  Location: [facility];  Service: Cardiovascular;  Laterality: N/A;   PROSTATE SURGERY     ROTATOR CUFF REPAIR Right 2003   TOTAL KNEE ARTHROPLASTY Left 11/04/2017   Procedure: TOTAL KNEE ARTHROPLASTY;  Surgeon: Molli Angelucci, MD;  Location: [facility] OR[facility];  Service: Orthopedics;  Laterality: Left;   TOTAL KNEE ARTHROPLASTY Right 03/02/2019   Procedure: TOTAL KNEE ARTHROPLASTY RIGHT;  Surgeon: Molli Angelucci, MD;  Location: [facility] OR[facility];  Service: Orthopedics;  Laterality: Right;    Home Medications:  Allergies as of 01/30/2024       Reactions   Shellfish Allergy Swelling   Swelling in eyes        Medication List        Accurate as of Jan 30, 2024  4:24 PM. If you have any questions, ask your nurse or doctor.  acetaminophen  500 MG tablet Commonly known as: TYLENOL  Take 500 mg by mouth 2 (two) times daily as needed (FOR PAIN.).   aspirin  EC 81 MG tablet Take 81 mg by mouth daily. Takes 2 daily   atorvastatin 40 MG tablet Commonly known as: LIPITOR   benazepril  10 MG tablet Commonly known as: LOTENSIN  Take 10 mg by mouth daily.   BIOTIN 5000 PO Take by mouth. What changed: Another medication with the same name was removed. Continue taking this medication, and follow the directions you see here. Changed by: Johnny Freeman   D2000 Ultra Strength 50 MCG (2000 UT) Caps Generic drug: Cholecalciferol  Take by mouth. Vitamind D3   diltiazem  180 MG 24 hr  capsule Commonly known as: CARDIZEM  CD Take 180 mg by mouth every morning.   docusate sodium  100 MG capsule Commonly known as: COLACE Take 100 mg by mouth every other day.   Multi-Vitamin tablet Take by mouth.   omeprazole 20 MG capsule Commonly known as: PRILOSEC TAKE 1 CAPSULE EVERY DAY   PROBIOTIC-10 PO Take 1 tablet by mouth daily.   triamcinolone  cream 0.1 % Commonly known as: KENALOG  Apply 1 Application topically as needed.   vitamin C  1000 MG tablet Take 1,000 mg by mouth daily.   Voltaren Arthritis Pain 1 % Gel Generic drug: diclofenac Sodium Apply topically as needed.        Allergies:  Allergies  Allergen Reactions   Shellfish Allergy Swelling    Swelling in eyes    Family History: Family History  Problem Relation Age of Onset   Heart attack Father    Breast cancer Sister     Social History:  reports that he quit smoking about 51 years ago. His smoking use included cigarettes. He has never used smokeless tobacco. He reports that he does not drink alcohol and does not use drugs.   Physical Exam: BP (!) 124/93   Pulse 87   Ht 5\' 8"  (1.727 m)   Wt 200 lb (90.7 kg)   BMI 30.41 kg/m   Constitutional:  Alert and oriented, No acute distress. HEENT: [place] AT, moist mucus membranes.  Trachea midline, no masses. Cardiovascular: No clubbing, cyanosis, or edema. Respiratory: Normal respiratory effort, no increased work of breathing. GI: Abdomen is soft, nontender, nondistended, no abdominal masses Skin: No rashes, bruises or suspicious lesions. Neurologic: Grossly intact, no focal deficits, moving all 4 extremities. Psychiatric: Normal mood and affect.   Assessment & Plan:    1. History of prostate cancer s/p HIFU 2011 for T1c low risk prostate cancer. PSA slowly increasing,  Follow-up PSA in 6 months 1 year office visit with PSA.  [facility] [address] [place], [place] [address] [phone]

## 2024-02-17 ENCOUNTER — Telehealth: Payer: Self-pay | Admitting: *Deleted

## 2024-02-17 NOTE — Telephone Encounter (Signed)
 Patient called in he is going to get labs in May 21 and he wanted to know if he can eat or have to be fasting.  I told him that he does not have to be fasting at all.  He was happy about that

## 2024-02-18 ENCOUNTER — Inpatient Hospital Stay: Attending: Internal Medicine

## 2024-02-18 DIAGNOSIS — Z86711 Personal history of pulmonary embolism: Secondary | ICD-10-CM | POA: Diagnosis not present

## 2024-02-18 DIAGNOSIS — M2550 Pain in unspecified joint: Secondary | ICD-10-CM | POA: Insufficient documentation

## 2024-02-18 DIAGNOSIS — D696 Thrombocytopenia, unspecified: Secondary | ICD-10-CM | POA: Diagnosis present

## 2024-02-18 DIAGNOSIS — C61 Malignant neoplasm of prostate: Secondary | ICD-10-CM | POA: Insufficient documentation

## 2024-02-18 DIAGNOSIS — Z79899 Other long term (current) drug therapy: Secondary | ICD-10-CM | POA: Diagnosis not present

## 2024-02-18 DIAGNOSIS — E611 Iron deficiency: Secondary | ICD-10-CM | POA: Diagnosis not present

## 2024-02-18 DIAGNOSIS — Z7982 Long term (current) use of aspirin: Secondary | ICD-10-CM | POA: Diagnosis not present

## 2024-02-18 DIAGNOSIS — Z803 Family history of malignant neoplasm of breast: Secondary | ICD-10-CM | POA: Insufficient documentation

## 2024-02-18 DIAGNOSIS — Z87891 Personal history of nicotine dependence: Secondary | ICD-10-CM | POA: Diagnosis not present

## 2024-02-18 LAB — CBC WITH DIFFERENTIAL (CANCER CENTER ONLY)
Abs Immature Granulocytes: 0.04 10*3/uL (ref 0.00–0.07)
Basophils Absolute: 0 10*3/uL (ref 0.0–0.1)
Basophils Relative: 0 %
Eosinophils Absolute: 0 10*3/uL (ref 0.0–0.5)
Eosinophils Relative: 0 %
HCT: 36.7 % — ABNORMAL LOW (ref 39.0–52.0)
Hemoglobin: 12.6 g/dL — ABNORMAL LOW (ref 13.0–17.0)
Immature Granulocytes: 1 %
Lymphocytes Relative: 28 %
Lymphs Abs: 1.6 10*3/uL (ref 0.7–4.0)
MCH: 30.9 pg (ref 26.0–34.0)
MCHC: 34.3 g/dL (ref 30.0–36.0)
MCV: 90 fL (ref 80.0–100.0)
Monocytes Absolute: 1.5 10*3/uL — ABNORMAL HIGH (ref 0.1–1.0)
Monocytes Relative: 26 %
Neutro Abs: 2.5 10*3/uL (ref 1.7–7.7)
Neutrophils Relative %: 45 %
Platelet Count: 111 10*3/uL — ABNORMAL LOW (ref 150–400)
RBC: 4.08 MIL/uL — ABNORMAL LOW (ref 4.22–5.81)
RDW: 14.2 % (ref 11.5–15.5)
WBC Count: 5.7 10*3/uL (ref 4.0–10.5)
nRBC: 0 % (ref 0.0–0.2)

## 2024-02-18 LAB — CMP (CANCER CENTER ONLY)
ALT: 22 U/L (ref 0–44)
AST: 34 U/L (ref 15–41)
Albumin: 4.2 g/dL (ref 3.5–5.0)
Alkaline Phosphatase: 59 U/L (ref 38–126)
Anion gap: 7 (ref 5–15)
BUN: 14 mg/dL (ref 8–23)
CO2: 24 mmol/L (ref 22–32)
Calcium: 8.7 mg/dL — ABNORMAL LOW (ref 8.9–10.3)
Chloride: 97 mmol/L — ABNORMAL LOW (ref 98–111)
Creatinine: 0.88 mg/dL (ref 0.61–1.24)
GFR, Estimated: >60 mL/min (ref >60)
Glucose, Bld: 91 mg/dL (ref 70–99)
Potassium: 4.1 mmol/L (ref 3.5–5.1)
Sodium: 128 mmol/L — ABNORMAL LOW (ref 135–145)
Total Bilirubin: 0.9 mg/dL (ref 0.0–1.2)
Total Protein: 7.4 g/dL (ref 6.5–8.1)

## 2024-02-19 ENCOUNTER — Other Ambulatory Visit: Payer: Self-pay | Admitting: *Deleted

## 2024-02-19 DIAGNOSIS — D696 Thrombocytopenia, unspecified: Secondary | ICD-10-CM

## 2024-02-25 ENCOUNTER — Other Ambulatory Visit: Payer: Medicare HMO

## 2024-02-25 ENCOUNTER — Inpatient Hospital Stay: Payer: Medicare HMO | Admitting: Internal Medicine

## 2024-02-25 ENCOUNTER — Encounter: Payer: Self-pay | Admitting: Internal Medicine

## 2024-02-25 VITALS — BP 134/77 | HR 74 | Temp 97.4°F | Resp 16 | Ht 68.0 in | Wt 204.1 lb

## 2024-02-25 DIAGNOSIS — D696 Thrombocytopenia, unspecified: Secondary | ICD-10-CM | POA: Diagnosis not present

## 2024-02-25 NOTE — Progress Notes (Signed)
 Easy bruising: YES Petechiae (bleeding under skin): YES Gingival bleeding (gums): NO Epistaxis (nose bleeds): NO Hematochezia (blood in stools): NO Hematuria (blood in urine):  NO  Having occ. Constipation, using miralax every other day and a fiber gummy every day, helping.  C/o joint pain. Has referral to see Dr. Lydia Sams Rheumatology.

## 2024-02-25 NOTE — Progress Notes (Signed)
 [facility] CONSULT NOTE  Patient Care Team: Little Riff, MD as PCP - General (Internal Medicine) Molli Angelucci, MD as Consulting Physician (Orthopedic Surgery) Gwyn Leos, MD as Consulting Physician (Oncology)  CHIEF COMPLAINTS/PURPOSE OF CONSULTATION: Thrombocytopenia  HISTORY OF PRESENTING ILLNESS: Patient ambulating-independently. accompanied by family.   Johnny Freeman 73 y.o.  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   Having occ. Constipation, using miralax every other day and a fiber gummy every day, helping.   C/o joint pain. Has referral to see Dr. Lydia Sams Rheumatology.  Patient denies any easy bruising or bleeding. Patient has chronic easy bruising on asprin. Otherwise no bleeding gums.   Review of Systems  Constitutional:  Positive for weight loss. Negative for chills, diaphoresis, fever and malaise/fatigue.  HENT:  Negative for nosebleeds and sore throat.   Eyes:  Negative for double vision.  Respiratory:  Negative for cough, hemoptysis, sputum production, shortness of breath and wheezing.   Cardiovascular:  Negative for chest pain, palpitations, orthopnea and leg swelling.  Gastrointestinal:  Negative for abdominal pain, blood in stool, constipation, diarrhea, heartburn, melena, nausea and vomiting.  Genitourinary:  Negative for dysuria, frequency and urgency.  Musculoskeletal:  Negative for back pain and joint pain.  Skin: Negative.  Negative for itching and rash.  Neurological:  Negative for dizziness, tingling, focal weakness, weakness and headaches.  Endo/Heme/Allergies:  Does not bruise/bleed easily.  Psychiatric/Behavioral:  Negative for depression. The patient is not nervous/anxious and does not have insomnia.      MEDICAL HISTORY:  Past Medical History:  Diagnosis Date   Anxiety    Arthritis    hands   Cancer (HCC)    prostate 2011   Complication of anesthesia    Dyspnea     GERD (gastroesophageal reflux disease)    Grade I diastolic dysfunction    Headache    migraines   Hypertension    PE (pulmonary thromboembolism) (HCC) 2019   after knee surgery   PONV (postoperative nausea and vomiting)    nauseated after prostate bx HIFU   Prostate cancer [facility])     SURGICAL HISTORY: Past Surgical History:  Procedure Laterality Date   CATARACT EXTRACTION W/PHACO Right 02/25/2023   Procedure: CATARACT EXTRACTION PHACO AND INTRAOCULAR LENS PLACEMENT (IOC) RIGHT  7.23   00:38.2;  Surgeon: Clair Crews, MD;  Location: [facility];  Service: Ophthalmology;  Laterality: Right;   CATARACT EXTRACTION W/PHACO Left 03/11/2023   Procedure: CATARACT EXTRACTION PHACO AND INTRAOCULAR LENS PLACEMENT (IOC) LEFT 9.51 00:49.9;  Surgeon: Clair Crews, MD;  Location: [facility];  Service: Ophthalmology;  Laterality: Left;   CYSTOSCOPY N/A 11/04/2017   Procedure: CYSTOSCOPY;  Surgeon: Molli Angelucci, MD;  Location: [facility] ORS;  Service: Orthopedics;  Laterality: N/A;   IVC FILTER INSERTION N/A 02/23/2019   Procedure: IVC FILTER INSERTION;  Surgeon: Celso College, MD;  Location: [facility] INVASIVE CV LAB;  Service: Cardiovascular;  Laterality: N/A;   IVC FILTER REMOVAL N/A 05/10/2019   Procedure: IVC FILTER REMOVAL;  Surgeon: Celso College, MD;  Location: [facility] INVASIVE CV LAB;  Service: Cardiovascular;  Laterality: N/A;   PROSTATE SURGERY     ROTATOR CUFF REPAIR Right 2003   TOTAL KNEE ARTHROPLASTY Left 11/04/2017   Procedure: TOTAL KNEE ARTHROPLASTY;  Surgeon: Molli Angelucci, MD;  Location: [facility] ORS;  Service: Orthopedics;  Laterality: Left;   TOTAL KNEE ARTHROPLASTY Right 03/02/2019   Procedure: TOTAL KNEE ARTHROPLASTY RIGHT;  Surgeon: Molli Angelucci, MD;  Location: [facility] ORS;  Service: Orthopedics;  Laterality: Right;    SOCIAL HISTORY: Social History   Socioeconomic History   Marital status: Married    Spouse name: Not on file   Number of children: Not on file   Years of  education: Not on file   Highest education level: Not on file  Occupational History   Not on file  Tobacco Use   Smoking status: Former    Current packs/day: 0.00    Types: Cigarettes    Quit date: 10/03/1972    Years since quitting: 51.4   Smokeless tobacco: Never  Vaping Use   Vaping status: Never Used  Substance and Sexual Activity   Alcohol use: No    Alcohol/week: 0.0 standard drinks of alcohol   Drug use: No   Sexual activity: Not on file  Other Topics Concern   Not on file  Social History Narrative   Not on file   Social Drivers of Health   Financial Resource Strain: Low Risk  (05/28/2023)   Received from [facility]   Overall Financial Resource Strain (CARDIA)    Difficulty of Paying Living Expenses: Not very hard  Food Insecurity: No Food Insecurity (05/28/2023)   Received from [facility]   Hunger Vital Sign    Worried About Running Out of Food in the Last Year: Never true    Ran Out of Food in the Last Year: Never true  Transportation Needs: No Transportation Needs (05/28/2023)   Received from [facility]   PRAPARE - Transportation    Lack of Transportation (Non-Medical): No    In the past 12 months, has lack of transportation kept you from medical appointments or from getting medications?: No  Physical Activity: Not on file  Stress: Not on file  Social Connections: Not on file  Intimate Partner Violence: Not on file    FAMILY HISTORY: Family History  Problem Relation Age of Onset   Heart attack Father    Breast cancer Sister     ALLERGIES:  is allergic to shellfish allergy.  MEDICATIONS:  Current Outpatient Medications  Medication Sig Dispense Refill   acetaminophen  (TYLENOL ) 650 MG CR tablet Take 650 mg by mouth every 8 (eight) hours as needed for pain.     Ascorbic Acid  (VITAMIN C ) 1000 MG tablet Take 1,000 mg by mouth daily.     aspirin  EC 81 MG tablet Take 81 mg by mouth daily. Takes 2 daily      atorvastatin (LIPITOR) 40 MG tablet Take 40 mg by mouth daily.     benazepril  (LOTENSIN ) 10 MG tablet Take 10 mg by mouth daily.     BIOTIN 5000 PO Take by mouth daily.     Cholecalciferol  (D2000 ULTRA STRENGTH) 50 MCG (2000 UT) CAPS Take by mouth daily. Vitamind D3     diclofenac Sodium (VOLTAREN ARTHRITIS PAIN) 1 % GEL Apply topically as needed.     diltiazem  (CARDIZEM  CD) 180 MG 24 hr capsule Take 180 mg by mouth every morning.      Multiple Vitamin (MULTI-VITAMIN) tablet Take 1 tablet by mouth daily.     omeprazole (PRILOSEC) 20 MG capsule TAKE 1 CAPSULE EVERY DAY     Probiotic Product (PROBIOTIC-10 PO) Take 1 tablet by mouth daily.     triamcinolone  cream (KENALOG ) 0.1 % Apply 1 Application topically as needed.     docusate sodium  (COLACE) 100 MG capsule Take 100 mg by mouth  every other day.  (Patient not taking: Reported on 02/25/2024)     No current facility-administered medications for this visit.    PHYSICAL EXAMINATION:  Vitals:   02/25/24 1037  BP: 134/77  Pulse: 74  Resp: 16  Temp: (!) 97.4 F (36.3 C)  SpO2: 99%   Filed Weights   02/25/24 1037  Weight: 204 lb 1.6 oz (92.6 kg)    Physical Exam Vitals and nursing note reviewed.  HENT:     Head: Normocephalic and atraumatic.     Mouth/Throat:     Pharynx: Oropharynx is clear.  Eyes:     Extraocular Movements: Extraocular movements intact.     Pupils: Pupils are equal, round, and reactive to light.  Cardiovascular:     Rate and Rhythm: Normal rate and regular rhythm.  Pulmonary:     Comments: Decreased breath sounds bilaterally.  Abdominal:     Palpations: Abdomen is soft.  Musculoskeletal:        General: Normal range of motion.     Cervical back: Normal range of motion.  Skin:    General: Skin is warm.  Neurological:     General: No focal deficit present.     Mental Status: He is alert and oriented to person, place, and time.  Psychiatric:        Behavior: Behavior normal.        Judgment: Judgment  normal.      LABORATORY DATA:  I have reviewed the data as listed Lab Results  Component Value Date   WBC 5.7 02/18/2024   HGB 12.6 (L) 02/18/2024   HCT 36.7 (L) 02/18/2024   MCV 90.0 02/18/2024   PLT 111 (L) 02/18/2024   Recent Labs    06/03/23 1205 09/10/23 1017 02/18/24 1000  NA 131* 133* 128*  K 3.9 4.2 4.1  CL 98 98 97*  CO2 24 27 24   GLUCOSE 102* 86 91  BUN 11 10 14   CREATININE 0.77 0.98 0.88  CALCIUM 8.8* 8.9 8.7*  GFRNONAA >60 >60 >60  PROT 7.4 7.3 7.4  ALBUMIN 4.3 4.2 4.2  AST 29 29 34  ALT 24 22 22   ALKPHOS 68 65 59  BILITOT 0.5 0.7 0.9    RADIOGRAPHIC STUDIES: I have personally reviewed the radiological images as listed and agreed with the findings in the report. No results found.  Thrombocytopenia (HCC) # # Mild anemia/ mild monocytosis-Thrombocytopenia: Mild AUG 2024-117.  However slowly trending down over the last 1-2 years.   SEP 2024-US  abdomen- neg for cirrhosis; Spleen is within normal limits. Eleavted Immauture platelts fraction:?  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   # Today platelets 111; Hb 12.2. I again discussed the role of a bone marrow biopsy for further evaluation of hematologic disorder- given MDS/ ITP- discussed with wife/patient.   # mild iron deficiency- [sep 2024- 24]- recommend PO iron. #Recommend gentle iron [iron biglycinate; 28 mg ] 1 pill a day.  This pill is unlikely to cause stomach upset or cause constipation.   # 2019 Acute PE [-provoked after surgery]-  Positive for acute pulmonary emboli within distal left lower lobe pulmonary artery with extension of thrombus into multiple segmental and subsegmental branches of the left lower lobe. S/p IVC filter-currently off anticaogulation- on asprin 81x2 per day.   # Joint pains/? Arthritis- awaiting Dr.Patel- [facility] evaluation. Check CRP.   # Chronic Hyponatremia: sodium 128 [drinks water 3-4 lits/day]- substitute with Gatorade;defer  to PCP- .   #  Prostate cancer [2011- Dr.Wolfe-[facility]IFU;  Dr.Stoioff;]- slow rise in PSA- defer to urology-monitor for now-    Q20m--52m # DISPOSITION: # follow up in 3 months-  MD; 2-3 days PRIOR- labs- CBC CMP; iron studies; ferritin; LDH; b12 levels; CRP- --  Dr.B    All questions were answered. The patient knows to call the clinic with any problems, questions or concerns.    Gwyn Leos, MD 02/25/2024 12:18 PM

## 2024-02-25 NOTE — Assessment & Plan Note (Addendum)
# #   Mild anemia/ mild monocytosis-Thrombocytopenia: Mild AUG 2024-117.  However slowly trending down over the last 1-2 years.   SEP 2024-US  abdomen- neg for cirrhosis; Spleen is within normal limits. Eleavted Immauture platelts fraction:?  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   # Today platelets 111; Hb 12.2. I again discussed the role of a bone marrow biopsy for further evaluation of hematologic disorder- given MDS/ ITP- discussed with wife/patient.   # mild iron deficiency- [sep 2024- 24]- recommend PO iron. #Recommend gentle iron [iron biglycinate; 28 mg ] 1 pill a day.  This pill is unlikely to cause stomach upset or cause constipation.   # 2019 Acute PE [-provoked after surgery]-  Positive for acute pulmonary emboli within distal left lower lobe pulmonary artery with extension of thrombus into multiple segmental and subsegmental branches of the left lower lobe. S/p IVC filter-currently off anticaogulation- on asprin 81x2 per day.   # Joint pains/? Arthritis- awaiting Dr.Patel- [facility] evaluation. Check CRP.   # Chronic Hyponatremia: sodium 128 [drinks water 3-4 lits/day]- substitute with Gatorade;defer to PCP- .   # Prostate cancer [2011- Dr.Wolfe-[facility];  Dr.Stoioff;]- slow rise in PSA- defer to urology-monitor for now-    Q657m--57m # DISPOSITION: # follow up in 3 months-  MD; 2-3 days PRIOR- labs- CBC CMP; iron studies; ferritin; LDH; b12 levels; CRP- --  Dr.B

## 2024-02-25 NOTE — Patient Instructions (Signed)
#  Recommend gentle iron [iron biglycinate; 28 mg ] 1 pill a day.  This pill is unlikely to cause stomach upset or cause constipation.

## 2024-03-15 ENCOUNTER — Other Ambulatory Visit: Payer: Medicare HMO

## 2024-03-15 ENCOUNTER — Ambulatory Visit: Payer: Medicare HMO | Admitting: Internal Medicine

## 2024-05-21 ENCOUNTER — Telehealth: Payer: Self-pay | Admitting: *Deleted

## 2024-05-21 NOTE — Telephone Encounter (Signed)
 The the wife is just checking because he just got a CBC and a met see at his PCP on August 21 and now he has got a call on August 25 to the cancer center and she does not want to have to pay for labs if they were already done just a few days ago.  So I looked into all the things that he had been having at the cancer center.   Rennie wants a iron studies ferritin LDH B12.  I talked to [facility] the nurse for Dr. Rennie and it is fine that we do not need to do a CBC with differential  because it is already been done on August 21 his PCP.  I called the patient back and let her know that were not going to do the CBC with differential and the Maxy but all the rest of them we will be getting.

## 2024-05-24 ENCOUNTER — Inpatient Hospital Stay: Attending: Internal Medicine

## 2024-05-24 ENCOUNTER — Other Ambulatory Visit: Payer: Self-pay | Admitting: *Deleted

## 2024-05-24 DIAGNOSIS — E611 Iron deficiency: Secondary | ICD-10-CM | POA: Diagnosis not present

## 2024-05-24 DIAGNOSIS — D696 Thrombocytopenia, unspecified: Secondary | ICD-10-CM

## 2024-05-24 LAB — IRON AND TIBC
Iron: 94 ug/dL (ref 45–182)
Saturation Ratios: 29 % (ref 17.9–39.5)
TIBC: 326 ug/dL (ref 250–450)
UIBC: 232 ug/dL

## 2024-05-24 LAB — VITAMIN B12: Vitamin B-12: 834 pg/mL (ref 180–914)

## 2024-05-24 LAB — LACTATE DEHYDROGENASE: LDH: 146 U/L (ref 98–192)

## 2024-05-24 LAB — FERRITIN: Ferritin: 42 ng/mL (ref 24–336)

## 2024-05-28 ENCOUNTER — Encounter: Payer: Self-pay | Admitting: Internal Medicine

## 2024-05-28 ENCOUNTER — Inpatient Hospital Stay: Admitting: Internal Medicine

## 2024-05-28 VITALS — BP 144/80 | HR 78 | Temp 98.0°F | Resp 16 | Wt 209.8 lb

## 2024-05-28 DIAGNOSIS — D696 Thrombocytopenia, unspecified: Secondary | ICD-10-CM | POA: Diagnosis not present

## 2024-05-28 NOTE — Progress Notes (Signed)
 [facility] CONSULT NOTE  Patient Care Team: Johnny Johnny BIRCH, MD as PCP - General (Internal Medicine) Kathlynn Sharper, MD as Consulting Physician (Orthopedic Surgery) Rennie Cindy SAUNDERS, MD as Consulting Physician (Oncology)  CHIEF COMPLAINTS/PURPOSE OF CONSULTATION: Thrombocytopenia  HISTORY OF PRESENTING ILLNESS: Patient ambulating-independently. accompanied by family.   Johnny Freeman Mages 73 y.o.  male patient with mild to moderate thrombocytopenia/mild anemia-with mild monocytosis and history of arthritis is here for a follow up.  Patient denies any easy bruising or bleeding. Patient has chronic easy bruising on asprin. Otherwise no bleeding gums. patient recommended starting plaquenil however- held sec to anemia concerns.   Review of Systems  Constitutional:  Negative for chills, diaphoresis, fever and malaise/fatigue.  HENT:  Negative for nosebleeds and sore throat.   Eyes:  Negative for double vision.  Respiratory:  Negative for cough, hemoptysis, sputum production, shortness of breath and wheezing.   Cardiovascular:  Negative for chest pain, palpitations, orthopnea and leg swelling.  Gastrointestinal:  Negative for abdominal pain, blood in stool, constipation, diarrhea, heartburn, melena, nausea and vomiting.  Genitourinary:  Negative for dysuria, frequency and urgency.  Musculoskeletal:  Positive for back pain and joint pain.  Skin: Negative.  Negative for itching and rash.  Neurological:  Negative for dizziness, tingling, focal weakness, weakness and headaches.  Endo/Heme/Allergies:  Does not bruise/bleed easily.  Psychiatric/Behavioral:  Negative for depression. The patient is not nervous/anxious and does not have insomnia.      MEDICAL HISTORY:  Past Medical History:  Diagnosis Date   Anxiety    Arthritis    hands   Cancer (HCC)    prostate 2011   Complication of anesthesia    Dyspnea    GERD (gastroesophageal reflux disease)    Grade I diastolic  dysfunction    Headache    migraines   Hypertension    PE (pulmonary thromboembolism) (HCC) 2019   after knee surgery   PONV (postoperative nausea and vomiting)    nauseated after prostate bx HIFU   Prostate cancer [facility])     SURGICAL HISTORY: Past Surgical History:  Procedure Laterality Date   CATARACT EXTRACTION W/PHACO Right 02/25/2023   Procedure: CATARACT EXTRACTION PHACO AND INTRAOCULAR LENS PLACEMENT (IOC) RIGHT  7.23   00:38.2;  Surgeon: Jaye Fallow, MD;  Location: [facility];  Service: Ophthalmology;  Laterality: Right;   CATARACT EXTRACTION W/PHACO Left 03/11/2023   Procedure: CATARACT EXTRACTION PHACO AND INTRAOCULAR LENS PLACEMENT (IOC) LEFT 9.51 00:49.9;  Surgeon: Jaye Fallow, MD;  Location: [facility];  Service: Ophthalmology;  Laterality: Left;   CYSTOSCOPY N/A 11/04/2017   Procedure: CYSTOSCOPY;  Surgeon: Kathlynn Sharper, MD;  Location: [facility] OR[facility];  Service: Orthopedics;  Laterality: N/A;   IVC FILTER INSERTION N/A 02/23/2019   Procedure: IVC FILTER INSERTION;  Surgeon: Marea Selinda RAMAN, MD;  Location: [facility] INVASIVE CV LAB;  Service: Cardiovascular;  Laterality: N/A;   IVC FILTER REMOVAL N/A 05/10/2019   Procedure: IVC FILTER REMOVAL;  Surgeon: Marea Selinda RAMAN, MD;  Location: [facility] IN[facility] CV LAB;  Service: Cardiovascular;  Laterality: N/A;   PROSTATE SURGERY     ROTATOR CUFF REPAIR Right 2003   TOTAL KNEE ARTHROPLASTY Left 11/04/2017   Procedure: TOTAL KNEE ARTHROPLASTY;  Surgeon: Kathlynn Sharper, MD;  Location: [facility] OR[facility];  Service: Orthopedics;  Laterality: Left;   TOTAL KNEE ARTHROPLASTY Right 03/02/2019   Procedure: TOTAL KNEE ARTHROPLASTY RIGHT;  Surgeon: Kathlynn Sharper, MD;  Location: [facility] OR[facility];  Service: Orthopedics;  Laterality: Right;    SOCIAL  HISTORY: Social History   Socioeconomic History   Marital status: Married    Spouse name: Not on file   Number of children: Not on file   Years of education: Not on file   Highest education level: Not on file   Occupational History   Not on file  Tobacco Use   Smoking status: Former    Current packs/day: 0.00    Types: Cigarettes    Quit date: 10/03/1972    Years since quitting: 51.6   Smokeless tobacco: Never  Vaping Use   Vaping status: Never Used  Substance and Sexual Activity   Alcohol use: No    Alcohol/week: 0.0 standard drinks of alcohol   Drug use: No   Sexual activity: Not on file  Other Topics Concern   Not on file  Social History Narrative   Not on file   Social Drivers of Health   Financial Resource Strain: Low Risk  (05/20/2024)   Received from [facility]   Overall Financial Resource Strain (CARDIA)    Difficulty of Paying Living Expenses: Not hard at all  Food Insecurity: No Food Insecurity (05/20/2024)   Received from [facility]   Hunger Vital Sign    Within the past 12 months, you worried that your food would run out before you got the money to buy more.: Never true    Within the past 12 months, the food you bought just didn't last and you didn't have money to get more.: Never true  Transportation Needs: No Transportation Needs (05/20/2024)   Received from [facility] - Transportation    In the past 12 months, has lack of transportation kept you from medical appointments or from getting medications?: No    Lack of Transportation (Non-Medical): No  Physical Activity: Not on file  Stress: Not on file  Social Connections: Not on file  Intimate Partner Violence: Not on file    FAMILY HISTORY: Family History  Problem Relation Age of Onset   Heart attack Father    Breast cancer Sister     ALLERGIES:  is allergic to shellfish allergy.  MEDICATIONS:  Current Outpatient Medications  Medication Sig Dispense Refill   acetaminophen  (TYLENOL ) 650 MG CR tablet Take 650 mg by mouth every 8 (eight) hours as needed for pain.     Ascorbic Acid  (VITAMIN C ) 1000 MG tablet Take 1,000 mg by mouth daily.      aspirin  EC 81 MG tablet Take 81 mg by mouth daily. Takes 2 daily     atorvastatin (LIPITOR) 40 MG tablet Take 40 mg by mouth daily.     benazepril  (LOTENSIN ) 10 MG tablet Take 10 mg by mouth daily.     BIOTIN 5000 PO Take by mouth daily.     Cholecalciferol  (D2000 ULTRA STRENGTH) 50 MCG (2000 UT) CAPS Take by mouth daily. Vitamind D3     diclofenac Sodium (VOLTAREN ARTHRITIS PAIN) 1 % GEL Apply topically as needed.     diltiazem  (CARDIZEM  CD) 180 MG 24 hr capsule Take 180 mg by mouth every morning.      Multiple Vitamin (MULTI-VITAMIN) tablet Take 1 tablet by mouth daily.     omeprazole (PRILOSEC) 20 MG capsule TAKE 1 CAPSULE EVERY DAY     Probiotic Product (PROBIOTIC-10 PO) Take 1 tablet by mouth daily.     triamcinolone  cream (KENALOG ) 0.1 % Apply 1 Application topically as needed.     docusate sodium  (COLACE) 100 MG  capsule Take 100 mg by mouth every other day.  (Patient not taking: Reported on 05/28/2024)     hydroxychloroquine (PLAQUENIL) 200 MG tablet Take 200 mg by mouth 2 (two) times daily. (Patient not taking: Reported on 05/28/2024)     No current facility-administered medications for this visit.    PHYSICAL EXAMINATION:  Vitals:   05/28/24 1430  BP: (!) 144/80  Pulse: 78  Resp: 16  Temp: 98 F (36.7 C)  SpO2: 96%   Filed Weights   05/28/24 1430  Weight: 95.2 kg    Physical Exam Vitals and nursing note reviewed.  HENT:     Head: Normocephalic and atraumatic.     Mouth/Throat:     Pharynx: Oropharynx is clear.  Eyes:     Extraocular Movements: Extraocular movements intact.     Pupils: Pupils are equal, round, and reactive to light.  Cardiovascular:     Rate and Rhythm: Normal rate and regular rhythm.  Abdominal:     Palpations: Abdomen is soft.  Musculoskeletal:        General: Normal range of motion.     Cervical back: Normal range of motion.  Skin:    General: Skin is warm.  Neurological:     General: No focal deficit present.     Mental Status: He is  alert and oriented to person, place, and time.  Psychiatric:        Behavior: Behavior normal.        Judgment: Judgment normal.      LABORATORY DATA:  I have reviewed the data as listed Lab Results  Component Value Date   WBC 5.7 02/18/2024   HGB 12.6 (L) 02/18/2024   HCT 36.7 (L) 02/18/2024   MCV 90.0 02/18/2024   PLT 111 (L) 02/18/2024   Recent Labs    06/03/23 1205 09/10/23 1017 02/18/24 1000  NA 131* 133* 128*  K 3.9 4.2 4.1  CL 98 98 97*  CO2 24 27 24   GLUCOSE 102* 86 91  BUN 11 10 14   CREATININE 0.77 0.98 0.88  CALCIUM 8.8* 8.9 8.7*  GFRNONAA >60 >60 >60  PROT 7.4 7.3 7.4  ALBUMIN 4.3 4.2 4.2  AST 29 29 34  ALT 24 22 22   ALKPHOS 68 65 59  BILITOT 0.5 0.7 0.9    RADIOGRAPHIC STUDIES: I have personally reviewed the radiological images as listed and agreed with the findings in the report. No results found.  Thrombocytopenia (HCC) # # Mild anemia/ mild monocytosis-Thrombocytopenia: Mild AUG 2024-117.  However slowly trending down over the last 1-2 years.   SEP 2024-US  abdomen- neg for cirrhosis; Spleen is within normal limits. Eleavted Immauture platelts fraction:?  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   # AUG 2025- PCP platelets 109; Hb 12.3. I again discussed the role of a bone marrow biopsy for further evaluation of hematologic disorder- given MDS/ ITP- discussed with wife/patient.  Patient interested in holding off any bone marrow biopsy at this time  # mild iron deficiency- [sep 2024- 24]-continue gentle iron [iron biglycinate; 28 mg ] 1 pill a day.- stable  # 2019 Acute PE [-provoked after surgery]-  Positive for acute pulmonary emboli within distal left lower lobe pulmonary artery with extension of thrombus into multiple segmental and subsegmental branches of the left lower lobe. S/p IVC filter-currently off anticaogulation- on asprin 81x2 per day.   # Prostate cancer [2011- Dr.Wolfe-[facility];   Dr.Stoioff;]- slow rise in PSA- defer to  urology-monitor for now-    # Arthritis [Dr. Patel]-okay from hematology standpoint to take Plaquenil.  Q36m--78m # DISPOSITION: # follow up in 3 months-  MD; 2-3 days PRIOR- labs- CBC CMP; iron studies; ferritin; LDH; b12 levels-; erythropoietin levels --  Dr.B     All questions were answered. The patient knows to call the clinic with any problems, questions or concerns.    Cindy JONELLE Joe, MD 05/28/2024 3:23 PM

## 2024-05-28 NOTE — Progress Notes (Signed)
 Patient here for follow up. Wife has questions about patient being prescribed hydroxychloroquine and if he should take it. Also, wife has questions about last after visit summary in May stating, decrease bilateral breath sounds.

## 2024-05-28 NOTE — Assessment & Plan Note (Addendum)
# #   Mild anemia/ mild monocytosis-Thrombocytopenia: Mild AUG 2024-117.  However slowly trending down over the last 1-2 years.   SEP 2024-US  abdomen- neg for cirrhosis; Spleen is within normal limits. Eleavted Immauture platelts fraction:?  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   # AUG 2025- PCP platelets 109; Hb 12.3. I again discussed the role of a bone marrow biopsy for further evaluation of hematologic disorder- given MDS/ ITP- discussed with wife/patient.  Patient interested in holding off any bone marrow biopsy at this time  # mild iron deficiency- [sep 2024- 24]-continue gentle iron [iron biglycinate; 28 mg ] 1 pill a day.- stable  # 2019 Acute PE [-provoked after surgery]-  Positive for acute pulmonary emboli within distal left lower lobe pulmonary artery with extension of thrombus into multiple segmental and subsegmental branches of the left lower lobe. S/p IVC filter-currently off anticaogulation- on asprin 81x2 per day.   # Prostate cancer [2011- Dr.Wolfe-HAIFU;  Dr.Stoioff;]- slow rise in PSA- defer to urology-monitor for now-    # Arthritis [Dr. Patel]-okay from hematology standpoint to take Plaquenil.  Q10m--61m # DISPOSITION: # follow up in 3 months-  MD; 2-3 days PRIOR- labs- CBC CMP; iron studies; ferritin; LDH; b12 levels-; erythropoietin levels --  Dr.B

## 2024-08-02 ENCOUNTER — Other Ambulatory Visit

## 2024-08-02 DIAGNOSIS — C61 Malignant neoplasm of prostate: Secondary | ICD-10-CM

## 2024-08-03 LAB — PSA: Prostate Specific Ag, Serum: 0.4 ng/mL (ref 0.0–4.0)

## 2024-08-05 ENCOUNTER — Ambulatory Visit: Payer: Self-pay | Admitting: Urology

## 2024-08-16 ENCOUNTER — Other Ambulatory Visit

## 2024-08-20 ENCOUNTER — Ambulatory Visit: Admitting: Internal Medicine

## 2024-08-30 ENCOUNTER — Inpatient Hospital Stay: Attending: Internal Medicine

## 2024-08-30 DIAGNOSIS — Z87891 Personal history of nicotine dependence: Secondary | ICD-10-CM | POA: Diagnosis not present

## 2024-08-30 DIAGNOSIS — Z7982 Long term (current) use of aspirin: Secondary | ICD-10-CM | POA: Insufficient documentation

## 2024-08-30 DIAGNOSIS — Z86711 Personal history of pulmonary embolism: Secondary | ICD-10-CM | POA: Insufficient documentation

## 2024-08-30 DIAGNOSIS — L299 Pruritus, unspecified: Secondary | ICD-10-CM | POA: Diagnosis not present

## 2024-08-30 DIAGNOSIS — Z79899 Other long term (current) drug therapy: Secondary | ICD-10-CM | POA: Insufficient documentation

## 2024-08-30 DIAGNOSIS — E611 Iron deficiency: Secondary | ICD-10-CM | POA: Diagnosis not present

## 2024-08-30 DIAGNOSIS — D696 Thrombocytopenia, unspecified: Secondary | ICD-10-CM | POA: Insufficient documentation

## 2024-08-30 DIAGNOSIS — M1990 Unspecified osteoarthritis, unspecified site: Secondary | ICD-10-CM | POA: Insufficient documentation

## 2024-08-30 DIAGNOSIS — Z803 Family history of malignant neoplasm of breast: Secondary | ICD-10-CM | POA: Diagnosis not present

## 2024-08-30 LAB — CBC WITH DIFFERENTIAL (CANCER CENTER ONLY)
Abs Immature Granulocytes: 0.04 K/uL (ref 0.00–0.07)
Basophils Absolute: 0 K/uL (ref 0.0–0.1)
Basophils Relative: 0 %
Eosinophils Absolute: 0.1 K/uL (ref 0.0–0.5)
Eosinophils Relative: 2 %
HCT: 39.8 % (ref 39.0–52.0)
Hemoglobin: 13.4 g/dL (ref 13.0–17.0)
Immature Granulocytes: 1 %
Lymphocytes Relative: 27 %
Lymphs Abs: 1.5 K/uL (ref 0.7–4.0)
MCH: 31 pg (ref 26.0–34.0)
MCHC: 33.7 g/dL (ref 30.0–36.0)
MCV: 92.1 fL (ref 80.0–100.0)
Monocytes Absolute: 1.5 K/uL — ABNORMAL HIGH (ref 0.1–1.0)
Monocytes Relative: 27 %
Neutro Abs: 2.3 K/uL (ref 1.7–7.7)
Neutrophils Relative %: 43 %
Platelet Count: 102 K/uL — ABNORMAL LOW (ref 150–400)
RBC: 4.32 MIL/uL (ref 4.22–5.81)
RDW: 14.8 % (ref 11.5–15.5)
WBC Count: 5.3 K/uL (ref 4.0–10.5)
nRBC: 0 % (ref 0.0–0.2)

## 2024-08-30 LAB — CMP (CANCER CENTER ONLY)
ALT: 27 U/L (ref 0–44)
AST: 40 U/L (ref 15–41)
Albumin: 4.6 g/dL (ref 3.5–5.0)
Alkaline Phosphatase: 70 U/L (ref 38–126)
Anion gap: 11 (ref 5–15)
BUN: 9 mg/dL (ref 8–23)
CO2: 26 mmol/L (ref 22–32)
Calcium: 9.4 mg/dL (ref 8.9–10.3)
Chloride: 98 mmol/L (ref 98–111)
Creatinine: 0.91 mg/dL (ref 0.61–1.24)
GFR, Estimated: >60 mL/min (ref >60)
Glucose, Bld: 97 mg/dL (ref 70–99)
Potassium: 4.3 mmol/L (ref 3.5–5.1)
Sodium: 135 mmol/L (ref 135–145)
Total Bilirubin: 0.5 mg/dL (ref 0.0–1.2)
Total Protein: 7.6 g/dL (ref 6.5–8.1)

## 2024-08-30 LAB — VITAMIN B12: Vitamin B-12: 932 pg/mL — ABNORMAL HIGH (ref 180–914)

## 2024-08-30 LAB — IRON AND TIBC
Iron: 91 ug/dL (ref 45–182)
Saturation Ratios: 27 % (ref 17.9–39.5)
TIBC: 340 ug/dL (ref 250–450)
UIBC: 250 ug/dL

## 2024-08-30 LAB — FERRITIN: Ferritin: 101 ng/mL (ref 24–336)

## 2024-08-30 LAB — LACTATE DEHYDROGENASE: LDH: 193 U/L (ref 105–235)

## 2024-08-31 LAB — ERYTHROPOIETIN: Erythropoietin: 23 m[IU]/mL — ABNORMAL HIGH (ref 2.6–18.5)

## 2024-09-06 ENCOUNTER — Inpatient Hospital Stay: Admitting: Internal Medicine

## 2024-09-08 ENCOUNTER — Inpatient Hospital Stay: Admitting: Internal Medicine

## 2024-09-08 ENCOUNTER — Encounter: Payer: Self-pay | Admitting: Internal Medicine

## 2024-09-08 VITALS — BP 130/76 | HR 82 | Temp 97.8°F | Resp 17 | Wt 196.0 lb

## 2024-09-08 DIAGNOSIS — D696 Thrombocytopenia, unspecified: Secondary | ICD-10-CM | POA: Diagnosis not present

## 2024-09-08 NOTE — Progress Notes (Signed)
 Hands bruise easily. Had teeth cleaned yesterday with no bleeding. Energy is good. Appetite is good. Bowels are normal.

## 2024-09-08 NOTE — Assessment & Plan Note (Addendum)
# #   Mild anemia/ mild monocytosis-Thrombocytopenia: Mild AUG 2024-117.  However slowly trending down over the last 1-2 years.   SEP 2024-US  abdomen- neg for cirrhosis; Spleen is within normal limits. Eleavted Immauture platelts fraction:?  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   # AUG 2025- PCP platelets 109; Hb 12.3. I again discussed the role of a bone marrow biopsy for further evaluation of hematologic disorder- given MDS/ ITP- discussed with wife/patient.  Patient interested in holding off any bone marrow biopsy at this time. Today platelets- 102; hb- 14. Overall stable.   # mild iron deficiency- [sep 2024- 24]-continue gentle iron [iron biglycinate; 28 mg ] 1 pill a day.- stable  # 2019 Acute PE [-provoked after surgery]-  Positive for acute pulmonary emboli within distal left lower lobe pulmonary artery with extension of thrombus into multiple segmental and subsegmental branches of the left lower lobe. S/p IVC filter-currently off anticaogulation- on asprin 81x2 per day.- ok with one baby asprin [No hx of stroke/CAD]stable  # Prostate cancer [2011- Dr.Wolfe-HAIFU;  Dr.Stoioff;]- slow rise in PSA- defer to urology-monitor for now-    # Arthritis [Dr. Patel]-okay from hematology standpoint to take Plaquenil- stable  # Maculopapular skin rash lower trunk flank-question etiology recommend antihistaminel if not improved follow-up with PCP for further recommendations.  Q21m--43m # DISPOSITION: # follow up in 4 months-  MD; 2-3 days PRIOR- labs- CBC CMP; iron studies; ferritin--  Dr.B

## 2024-09-08 NOTE — Progress Notes (Signed)
 [facility] CONSULT NOTE  Patient Care Team: Rudolpho Norleen BIRCH, MD as PCP - General (Internal Medicine) Kathlynn Sharper, MD as Consulting Physician (Orthopedic Surgery) Rennie Cindy SAUNDERS, MD as Consulting Physician (Oncology)  CHIEF COMPLAINTS/PURPOSE OF CONSULTATION: Thrombocytopenia  HISTORY OF PRESENTING ILLNESS: Patient ambulating-independently. accompanied by family.   [name] 73 y.o.  male patient with mild to moderate thrombocytopenia/mild anemia-with mild monocytosis and history of arthritis is here for a follow up.  ,Discussed the use of AI scribe software for clinical note transcription with the patient, who gave verbal consent to proceed.  History of Present Illness   DARROLL BREDESON is a 73 year old male with chronic immune thrombocytopenic purpura presenting for hematology follow-up to monitor declining platelet counts.  He has chronic moderate thrombocytopenia, with recent laboratory results demonstrating a gradual decline in platelet count from 111 to 102 x10^9/L. He remains asymptomatic with respect to bleeding, reporting no epistaxis, ecchymosis, or abnormal bruising. Hemoglobin has normalized to 13.0 g/dL. He has no history of cerebrovascular accident or myocardial infarction.  He continues aspirin  81 mg twice daily, initiated by a previous provider, and takes a daily multivitamin. He receives prophylactic antibiotics prior to dental procedures due to a prior knee replacement. He inquired about dietary factors, including dried cranberries and iron intake, and was reassured regarding current laboratory values.  He reports persistent pruritus localized to the back and sides for approximately one year, occasionally associated with a rash. A previous episode was diagnosed as urticaria and treated with corticosteroids, which provided partial relief. He currently takes Zyrtec daily and has not trialed other antihistamines. The pruritus is ongoing but not  associated with acute or concerning findings.      Review of Systems  Constitutional:  Negative for chills, diaphoresis, fever and malaise/fatigue.  HENT:  Negative for nosebleeds and sore throat.   Eyes:  Negative for double vision.  Respiratory:  Negative for cough, hemoptysis, sputum production, shortness of breath and wheezing.   Cardiovascular:  Negative for chest pain, palpitations, orthopnea and leg swelling.  Gastrointestinal:  Negative for abdominal pain, blood in stool, constipation, diarrhea, heartburn, melena, nausea and vomiting.  Genitourinary:  Negative for dysuria, frequency and urgency.  Musculoskeletal:  Positive for back pain and joint pain.  Skin: Negative.  Negative for itching and rash.  Neurological:  Negative for dizziness, tingling, focal weakness, weakness and headaches.  Endo/Heme/Allergies:  Does not bruise/bleed easily.  Psychiatric/Behavioral:  Negative for depression. The patient is not nervous/anxious and does not have insomnia.      MEDICAL HISTORY:  Past Medical History:  Diagnosis Date   Anxiety    Arthritis    hands   Cancer (HCC)    prostate 2011   Complication of anesthesia    Dyspnea    GERD (gastroesophageal reflux disease)    Grade I diastolic dysfunction    Headache    migraines   Hypertension    PE (pulmonary thromboembolism) (HCC) 2019   after knee surgery   PONV (postoperative nausea and vomiting)    nauseated after prostate bx HIFU   Prostate cancer [facility])     SURGICAL HISTORY: Past Surgical History:  Procedure Laterality Date   CATARACT EXTRACTION W/PHACO Right 02/25/2023   Procedure: CATARACT EXTRACTION PHACO AND INTRAOCULAR LENS PLACEMENT (IOC) RIGHT  7.23   00:38.2;  Surgeon: Jaye Fallow, MD;  Location: [facility];  Service: Ophthalmology;  Laterality: Right;   CATARACT EXTRACTION W/PHACO Left 03/11/2023   Procedure: CATARACT EXTRACTION PHACO AND  INTRAOCULAR LENS PLACEMENT (IOC) LEFT 9.51 00:49.9;  Surgeon:  Jaye Fallow, MD;  Location: [facility];  Service: Ophthalmology;  Laterality: Left;   CYSTOSCOPY N/A 11/04/2017   Procedure: CYSTOSCOPY;  Surgeon: Kathlynn Sharper, MD;  Location: [facility];  Service: Orthopedics;  Laterality: N/A;   IVC FILTER INSERTION N/A 02/23/2019   Procedure: IVC FILTER INSERTION;  Surgeon: Marea Selinda RAMAN, MD;  Location: [facility];  Service: Cardiovascular;  Laterality: N/A;   IVC FILTER REMOVAL N/A 05/10/2019   Procedure: IVC FILTER REMOVAL;  Surgeon: Marea Selinda RAMAN, MD;  Location: [facility];  Service: Cardiovascular;  Laterality: N/A;   PROSTATE SURGERY     ROTATOR CUFF REPAIR Right 2003   TOTAL KNEE ARTHROPLASTY Left 11/04/2017   Procedure: TOTAL KNEE ARTHROPLASTY;  Surgeon: Kathlynn Sharper, MD;  Location: [facility];  Service: Orthopedics;  Laterality: Left;   TOTAL KNEE ARTHROPLASTY Right 03/02/2019   Procedure: TOTAL KNEE ARTHROPLASTY RIGHT;  Surgeon: Kathlynn Sharper, MD;  Location: [facility];  Service: Orthopedics;  Laterality: Right;    SOCIAL HISTORY: Social History   Socioeconomic History   Marital status: Married    Spouse name: Not on file   Number of children: Not on file   Years of education: Not on file   Highest education level: Not on file  Occupational History   Not on file  Tobacco Use   Smoking status: Former    Current packs/day: 0.00    Types: Cigarettes    Quit date: 10/03/1972    Years since quitting: 51.9   Smokeless tobacco: Never  Vaping Use   Vaping status: Never Used  Substance and Sexual Activity   Alcohol use: No    Alcohol/week: 0.0 standard drinks of alcohol   Drug use: No   Sexual activity: Not on file  Other Topics Concern   Not on file  Social History Narrative   Not on file   Social Drivers of Health   Financial Resource Strain: Low Risk  (05/20/2024)   Received from [facility]   Overall Financial Resource Strain (CARDIA)    Difficulty of Paying Living Expenses: Not hard at all   Food Insecurity: No Food Insecurity (05/20/2024)   Received from [facility]   Hunger Vital Sign    Within the past 12 months, you worried that your food would run out before you got the money to buy more.: Never true    Within the past 12 months, the food you bought just didn't last and you didn't have money to get more.: Never true  Transportation Needs: No Transportation Needs (05/20/2024)   Received from [facility] - Transportation    In the past 12 months, has lack of transportation kept you from medical appointments or from getting medications?: No    Lack of Transportation (Non-Medical): No  Physical Activity: Not on file  Stress: Not on file  Social Connections: Not on file  Intimate Partner Violence: Not on file    FAMILY HISTORY: Family History  Problem Relation Age of Onset   Heart attack Father    Breast cancer Sister     ALLERGIES:  is allergic to shellfish allergy.  MEDICATIONS:  Current Outpatient Medications  Medication Sig Dispense Refill   acetaminophen  (TYLENOL ) 650 MG CR tablet Take 650 mg by mouth every 8 (eight) hours as needed for pain.     amoxicillin  (AMOXIL ) 500 MG tablet Take 500 mg by mouth.  Takes 4 tablets prior to Dental procedures.     Ascorbic Acid  (VITAMIN C ) 1000 MG tablet Take 1,000 mg by mouth daily.     aspirin  EC 81 MG tablet Take 81 mg by mouth daily. Takes 2 daily     atorvastatin (LIPITOR) 40 MG tablet Take 40 mg by mouth daily.     benazepril  (LOTENSIN ) 10 MG tablet Take 10 mg by mouth daily.     BIOTIN 5000 PO Take by mouth daily.     Cholecalciferol  (D2000 ULTRA STRENGTH) 50 MCG (2000 UT) CAPS Take by mouth daily. Vitamind D3     diclofenac Sodium (VOLTAREN ARTHRITIS PAIN) 1 % GEL Apply topically as needed.     diltiazem  (CARDIZEM  CD) 180 MG 24 hr capsule Take 180 mg by mouth every morning.      Fe Bisgly-Vit C-Vit B12-FA (GENTLE IRON) 28-60-0.008-0.4 MG CAPS Take 1 capsule by mouth daily.      Multiple Vitamin (MULTI-VITAMIN) tablet Take 1 tablet by mouth daily.     omeprazole (PRILOSEC) 20 MG capsule TAKE 1 CAPSULE EVERY DAY     Probiotic Product (PROBIOTIC-10 PO) Take 1 tablet by mouth daily.     triamcinolone  cream (KENALOG ) 0.1 % Apply 1 Application topically as needed.     docusate sodium  (COLACE) 100 MG capsule Take 100 mg by mouth every other day.  (Patient not taking: Reported on 09/08/2024)     No current facility-administered medications for this visit.    PHYSICAL EXAMINATION:  Vitals:   09/08/24 1508  BP: 130/76  Pulse: 82  Resp: 17  Temp: 97.8 F (36.6 C)  SpO2: 98%   Filed Weights   09/08/24 1508  Weight: 196 lb (88.9 kg)   Maculopapular skin rash lower trunk flank  Physical Exam Vitals and nursing note reviewed.  HENT:     Head: Normocephalic and atraumatic.     Mouth/Throat:     Pharynx: Oropharynx is clear.  Eyes:     Extraocular Movements: Extraocular movements intact.     Pupils: Pupils are equal, round, and reactive to light.  Cardiovascular:     Rate and Rhythm: Normal rate and regular rhythm.  Abdominal:     Palpations: Abdomen is soft.  Musculoskeletal:        General: Normal range of motion.     Cervical back: Normal range of motion.  Skin:    General: Skin is warm.  Neurological:     General: No focal deficit present.     Mental Status: He is alert and oriented to person, place, and time.  Psychiatric:        Behavior: Behavior normal.        Judgment: Judgment normal.      LABORATORY DATA:  I have reviewed the data as listed Lab Results  Component Value Date   WBC 5.3 08/30/2024   HGB 13.4 08/30/2024   HCT 39.8 08/30/2024   MCV 92.1 08/30/2024   PLT 102 (L) 08/30/2024   Recent Labs    09/10/23 1017 02/18/24 1000 08/30/24 0830  NA 133* 128* 135  K 4.2 4.1 4.3  CL 98 97* 98  CO2 27 24 26   GLUCOSE 86 91 97  BUN 10 14 9   CREATININE 0.98 0.88 0.91  CALCIUM 8.9 8.7* 9.4  GFRNONAA >60 >60 >60  PROT 7.3  7.4 7.6  ALBUMIN 4.2 4.2 4.6  AST 29 34 40  ALT 22 22 27   ALKPHOS 65 59 70  BILITOT 0.7 0.9 0.5  RADIOGRAPHIC STUDIES: I have personally reviewed the radiological images as listed and agreed with the findings in the report. No results found.  Thrombocytopenia # # Mild anemia/ mild monocytosis-Thrombocytopenia: Mild AUG 2024-117.  However slowly trending down over the last 1-2 years.   SEP 2024-US  abdomen- neg for cirrhosis; Spleen is within normal limits. Eleavted Immauture platelts fraction:?  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   # AUG 2025- PCP platelets 109; Hb 12.3. I again discussed the role of a bone marrow biopsy for further evaluation of hematologic disorder- given MDS/ ITP- discussed with wife/patient.  Patient interested in holding off any bone marrow biopsy at this time. Today platelets- 102; hb- 14. Overall stable.   # mild iron deficiency- [sep 2024- 24]-continue gentle iron [iron biglycinate; 28 mg ] 1 pill a day.- stable  # 2019 Acute PE [-provoked after surgery]-  Positive for acute pulmonary emboli within distal left lower lobe pulmonary artery with extension of thrombus into multiple segmental and subsegmental branches of the left lower lobe. S/p IVC filter-currently off anticaogulation- on asprin 81x2 per day.- ok with one baby asprin [No hx of stroke/CAD]stable  # Prostate cancer [2011- Dr.Wolfe-HAIFU;  Dr.Stoioff;]- slow rise in PSA- defer to urology-monitor for now-    # Arthritis [Dr. Patel]-okay from hematology standpoint to take Plaquenil- stable  # Maculopapular skin rash lower trunk flank-question etiology recommend antihistaminel if not improved follow-up with PCP for further recommendations.  Q54m--81m # DISPOSITION: # follow up in 4 months-  MD; 2-3 days PRIOR- labs- CBC CMP; iron studies; ferritin--  Dr.B      All questions were answered. The patient knows to call the clinic with any problems,  questions or concerns.    Cindy JONELLE Joe, MD 09/08/2024 4:02 PM

## 2025-01-04 ENCOUNTER — Inpatient Hospital Stay

## 2025-01-07 ENCOUNTER — Inpatient Hospital Stay: Admitting: Internal Medicine

## 2025-01-26 ENCOUNTER — Other Ambulatory Visit

## 2025-01-28 ENCOUNTER — Ambulatory Visit: Admitting: Urology
